# Patient Record
Sex: Male | Born: 1945 | Race: White | Hispanic: No | Marital: Single | Smoking: Never smoker
Health system: Southern US, Community
[De-identification: ages and names within clinical notes are randomized; demographics above are authoritative.]

## PROBLEM LIST (undated history)

## (undated) DIAGNOSIS — E7800 Pure hypercholesterolemia, unspecified: Secondary | ICD-10-CM

## (undated) DIAGNOSIS — I10 Essential (primary) hypertension: Secondary | ICD-10-CM

## (undated) DIAGNOSIS — K5792 Diverticulitis of intestine, part unspecified, without perforation or abscess without bleeding: Secondary | ICD-10-CM

---
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
## 1971-04-27 DIAGNOSIS — K759 Inflammatory liver disease, unspecified: Secondary | ICD-10-CM

## 1971-04-27 HISTORY — DX: Inflammatory liver disease, unspecified: K75.9

## 1998-10-25 ENCOUNTER — Ambulatory Visit (HOSPITAL_COMMUNITY): Admission: RE | Admit: 1998-10-25 | Discharge: 1998-10-25 | Payer: Self-pay | Admitting: Family Medicine

## 1998-10-25 ENCOUNTER — Encounter: Payer: Self-pay | Admitting: Family Medicine

## 1999-04-27 HISTORY — PX: COLON SURGERY: SHX602

## 1999-12-08 ENCOUNTER — Inpatient Hospital Stay (HOSPITAL_COMMUNITY): Admission: EM | Admit: 1999-12-08 | Discharge: 1999-12-15 | Payer: Self-pay | Admitting: Emergency Medicine

## 1999-12-25 ENCOUNTER — Encounter: Payer: Self-pay | Admitting: Internal Medicine

## 1999-12-25 ENCOUNTER — Ambulatory Visit (HOSPITAL_COMMUNITY): Admission: RE | Admit: 1999-12-25 | Discharge: 1999-12-25 | Payer: Self-pay | Admitting: Internal Medicine

## 2000-01-26 ENCOUNTER — Ambulatory Visit (HOSPITAL_COMMUNITY): Admission: RE | Admit: 2000-01-26 | Discharge: 2000-01-26 | Payer: Self-pay | Admitting: General Surgery

## 2000-01-26 ENCOUNTER — Encounter: Payer: Self-pay | Admitting: General Surgery

## 2000-02-12 ENCOUNTER — Inpatient Hospital Stay (HOSPITAL_COMMUNITY): Admission: RE | Admit: 2000-02-12 | Discharge: 2000-02-18 | Payer: Self-pay | Admitting: General Surgery

## 2000-02-12 ENCOUNTER — Encounter (INDEPENDENT_AMBULATORY_CARE_PROVIDER_SITE_OTHER): Payer: Self-pay | Admitting: Specialist

## 2000-12-16 ENCOUNTER — Encounter: Admission: RE | Admit: 2000-12-16 | Discharge: 2000-12-16 | Payer: Self-pay | Admitting: Infectious Diseases

## 2001-01-06 ENCOUNTER — Encounter: Admission: RE | Admit: 2001-01-06 | Discharge: 2001-01-06 | Payer: Self-pay | Admitting: Infectious Diseases

## 2002-04-16 ENCOUNTER — Ambulatory Visit (HOSPITAL_BASED_OUTPATIENT_CLINIC_OR_DEPARTMENT_OTHER): Admission: RE | Admit: 2002-04-16 | Discharge: 2002-04-16 | Payer: Self-pay | Admitting: Family Medicine

## 2005-04-08 ENCOUNTER — Ambulatory Visit (HOSPITAL_COMMUNITY): Admission: RE | Admit: 2005-04-08 | Discharge: 2005-04-08 | Payer: Self-pay | Admitting: Orthopedic Surgery

## 2005-04-08 ENCOUNTER — Ambulatory Visit (HOSPITAL_BASED_OUTPATIENT_CLINIC_OR_DEPARTMENT_OTHER): Admission: RE | Admit: 2005-04-08 | Discharge: 2005-04-08 | Payer: Self-pay | Admitting: Orthopedic Surgery

## 2006-04-26 HISTORY — PX: ROTATOR CUFF REPAIR: SHX139

## 2008-04-26 DIAGNOSIS — Z87442 Personal history of urinary calculi: Secondary | ICD-10-CM

## 2008-04-26 HISTORY — PX: COLONOSCOPY: SHX174

## 2008-04-26 HISTORY — DX: Personal history of urinary calculi: Z87.442

## 2009-01-03 ENCOUNTER — Encounter: Admission: RE | Admit: 2009-01-03 | Discharge: 2009-01-03 | Payer: Self-pay | Admitting: Family Medicine

## 2010-09-11 NOTE — H&P (Signed)
[facility]  Patient:    Stanley Romero, Stanley Romero                     MRN: [id number] Adm. Date:  [id number] Attending:  Felecia Shelling                         History and Physical  PRIMARY PHYSICIAN:  Dr. Elana Alm. Eliezer Lofts. at [facility].  CHIEF COMPLAINT:  Abdominal pain.  HISTORY OF PRESENT ILLNESS:  [name] is a 65 year old white male who presented to the emergency room on the day of admission with a three-day history of gradually worsening abdominal pain which localized to the left lower quadrant.  He has developed fever up to 102.  He has noted some soft, thin bowel movements.  He has not had any nausea or vomiting.  He denies any blood in the stool.  He was seen in the emergency room and found to have CT findings consistent with acute sigmoid diverticulitis.  He is admitted for inpatient management.  ALLERGIES:  None.  MEDICATIONS:  Pravachol 40 mg p.o. q.d.  MEDICAL HISTORY:  Medical history is remarkable for the hypercholesterolemia. He has been otherwise in good health.  SURGICAL HISTORY:  He had an appendectomy as a child.  FAMILY HISTORY:  The patients father has heart disease and diabetes.  Mother has breast cancer.  SOCIAL HISTORY:  He is married.  He does not smoke.  REVIEW OF SYSTEMS:  He denies any headache, chest pain, cough or shortness of breath.  He has not had any back pain or urinary symptoms.  No history of recorded weight loss.  He has had some decreased appetite recently.  PHYSICAL EXAMINATION  GENERAL:  Patient is a well-developed, well-nourished white male in moderate distress due to the abdominal pain.  VITAL SIGNS:  Temperature is 101.4.  Pulse is 110.  Respiratory rate 16 and nonlabored.  Blood pressure is 120/70.  HEENT:  The head is of normal configuration without lesions.  Eyes:  Pupils equal, round and reactive to light and accommodation.  There are normal extraocular movements.  Fundi  are benign.  Ears:  TMs are clear.  Throat is clear.  NECK:  Supple without adenopathy or thyromegaly.  LUNGS:  Clear.  HEART:  Regular rate and rhythm with normal S1 and S2, without murmurs, gallops or rubs.  ABDOMEN:  Active bowel sounds.  It is soft.  There is localized tenderness with some guarding in the left lower quadrant.  There is no rebound.  EXTREMITIES:  There is good range of motion of the hips without excessive pain.  RECTAL:  Exam is benign.  Stool is heme-negative.  EXTREMITIES:  Normal.  There is good range of motion.  BACK:  Straight.  NEUROLOGIC:  Cranial nerves II-XII are intact.  There is good motor, sensory and cerebellar findings.  Reflexes are 2+ and equal.  LABORATORY AND X-RAY FINDINGS:  His white blood cell count was 13,100, 87 segs and 7 lymphs.  His SGOT was 115, SGPT 105.  CT scan revealed focal proximal sigmoid diverticulitis with focal microperforation.  No evidence of abscess.  ASSESSMENT: 1. Sigmoid diverticulitis with microperforation. 2. Hypercholesterolemia.  PLAN:  Admit.  Bowel rest.  N.p.o.  IV normal saline at 100 cc/hr. Ciprofloxacin 400 mg IV q.12h.  Metronidazole 500 mg IV q.8h.  Morphine sulfate as needed for pain. DD:  12/08/99 TD:  12/08/99 Job: [id number]

## 2010-09-11 NOTE — Discharge Summary (Signed)
[facility]  Patient:    Stanley Romero, Stanley Romero                     MRN: [id number] Adm. Date:  [id number] Disc. Date: [id number] Attending:  Caleen Essex                           Discharge Summary  HISTORY:  Mr. Stanley Romero is a 65 year old white male who was admitted six days ago for an elective sigmoid colectomy for diverticulitis.  His operation had no complications.  The sigmoid colon was removed without difficulty and repaired primarily.  His postoperative course has been unremarkable.  He is currently tolerating a diet.  His pain is well-controlled with p.o. pain medicines.  He is ready for discharge to home.  CONDITION ON DISCHARGE:  Stable.  DISCHARGE MEDICATIONS:  He is to resume his home medications and he may have Vicodin 1-2 p.o. q.4-6h. p.r.n. for pain.  DISCHARGE INSTRUCTIONS:  His activity level is restricted to no heavy lifting. His diet has no restrictions.  For his wound care, he will go home with Steri-Strips over his wound and his staples will be discontinued today.  He may shower when he arrives at home.  FOLLOW-UP:  He will follow up with Dr. Carolynne Edouard in 1-2 weeks in the clinic and he is discharged home. DD:  02/18/00 TD:  02/18/00 Job: [id number]

## 2010-09-11 NOTE — Op Note (Signed)
[facility]  Patient:    Stanley Romero, Stanley Romero                     MRN: [id number] Proc. Date: 02/12/00 Adm. Date:  [id number] Attending:  Chevis Pretty S                           Operative Report  PREOPERATIVE DIAGNOSIS:  Diverticulitis  POSTOPERATIVE DIAGNOSIS:  Diverticulitis.  OPERATION:  Sigmoid colectomy.  SURGEON:  Chevis Pretty, M.D.  ASSISTANT:  Donnie Coffin. Samuella Cota, M.D.  ANESTHESIA:  General endotracheal anesthesia.  DESCRIPTION OF PROCEDURE:  After informed consent was obtained, the patient was brought to the operating room and placed in supine position on the operating table.  After adequate induction of general anesthesia, the patients abdomen was prepped with Betadine and draped in the usual sterile manner.  A lower midline incision was made with #10 blade knife. This incision was carried down through the rest of the skin and subcutaneous tissue using the Bovie electrocautery until the fascia of the anterior abdominal midline was encountered.  This was incised with the Bovie electrocautery.  The preperitoneal space was probed with a hemostat until the peritoneum was encountered.  The peritoneum was grasped between two hemostats and elevated. Care was taken to make sure there were no viscera between the two clamps, and the peritoneum was opened with the Bovie electrocautery.  This allowed entrance into the peritoneal cavity.  The rest of the incision was opened under direct vision for the length of the wound.  The abdomen was inspected. There were a few small adhesions of the omentum to the right abdominal wall and colon which were taken down with the Bovie electrocautery.  The small bowel was run and appeared normal.  The liver was inspected, and there were no mass lesions or abnormalities palpable.  The gallbladder was palpated and appeared to be normal with no stones.  The left colon was inspected, and there was a short segment of sigmoid colon  that had a couple visible diverticula. This portion of the sigmoid colon was slightly more injected than the rest of the colon, but the inflammatory process was pretty much resolved.  The sigmoid colon was mobilized by incising the peritoneal reflection at the white line of Toldt, and the sigmoid colon was freed to move with its mesentery.  The descending colon above was also mobilized in the same fashion. A point was chosen on the distal sigmoid colon below any apparent diverticula where the colon appeared to be normal without any inflammatory injection.  The mesentery at this point was incised with the Bovie electrocautery next to the bowel wall so that the distal sigmoid colon there could be circumferentially inspected and cleaned of any epiploicae or fatty tissue.  The distal sigmoid colon was then divided with a GIA 75 stapler.  A similar point was chosen proximally on the proximal sigmoid colon, and the mesentery was incised at its point adjacent to the colon wall so that the colon was cleared of any debris circumferentially.  The proximal colon was then divided with a GIA 75 stapler. The mesentery of the sigmoid colon was then serially clamped between Kelly clamps, cut, and ligated with 2-0 silk ties until the portion of sigmoid colon in question was free.  This portion of sigmoid colon was then sent to pathology for further evaluation.  The stapled edges of the proximal and distal colon were  cleaned of any fatty epiploicae tissue.  A 3-0 silk stitch was placed in the bowel wall at each lateral margin of the staple lines and tied.  These were tied down and then hemostated to hold the bowel in good approximation.  The staple lines were then removed using Bovie electrocautery, and the lumen of the intestine was examined and appeared to be normal. A row of seromuscular 3-0 silk sutures were placed in the two opposing posterior walls of the proximal and distal colon.  Next, a 4-0 PDS  suture was used for the colonic anastomosis starting in the middle of the posterior wall and running circumferentially until the two stitches met anteriorly.  These bites were full thickness, and the mucosa was easily approximated.  Once the two ends had met anteriorly, these two stitches were tied to each other, taking care not to pursestring the lumen of the bowel closed.  Next, an anterior layer of seromuscular 3-0 silk sutures were used to buttress the anterior segment of the anastomosis.  The bowel was inspected, and lumen was widely patent.  Hemostasis was excellent.  The mesentery of the sigmoid colon was then sewed down to the posterior peritoneum with a figure-of-eight 3-0 silk stitch to prevent any internal herniation.  The abdomen was then irrigated with copious amounts of warm saline.  The NG tube was palpated and was in the proper position.  The fascia of the abdomen was then closed with two #1 running PDS sutures.  The incision was then irrigated with copious amounts of saline, and the skin was closed with staples.  A sterile dressing was applied. The patient tolerated the procedure well.  At the end of the case, all sponge, needle, and instrument counts were correct.  The patient was awakened and taken to the recovery room in stable condition. DD:  02/12/00 TD:  02/14/00 Job: [id number]

## 2010-09-11 NOTE — Discharge Summary (Signed)
[facility]  Patient:    Stanley Romero, AGE                     MRN: [id number] Adm. Date:  [id number] Disc. Date: [id number] Attending:  Anastasio Romero CC:         Stanley Alm. Stanley Lofts., M.D.  Stanley Romero, M.D.   Discharge Summary  CONSULTATIONS:  Dr. Carolynne Romero.  PROCEDURES:  None.  DISCHARGE DIAGNOSES: 1. Acute diverticulitis with diverticular abscess. 2. Dyslipidemia. 3. History of appendectomy. 4. Elevated hepatocellular enzymes this admission, with near-complete    resolution, felt secondary to acute illness.  DISCHARGE MEDICATIONS: 1. Flagyl 500 mg p.o. t.i.d. x 3 weeks. 2. Cipro 500 mg p.o. b.i.d. x 3 weeks. 3. Pravachol 40 mg p.o. q.d. 4. Enteric-coated aspirin 81 mg p.o. q.d. 5. Phenergan 25-50 q.4-6h. p.r.n. nausea.  FOLLOW-UP:  The patient is scheduled with Dr. Chevis Romero Tuesday, December 29, 1999, at 9 a.m.  He is scheduled for repeat CT of the abdomen and pelvis at [facility] Friday, December 25, 1999, at 10 a.m.  HISTORY OF PRESENT ILLNESS:  The patient is a 65 year old man who presented to the emergency room on the day of admission with a three-day history of gradually worsening abdominal pain which localized to the left lower quadrant. He had also experienced a fever of 102, soft and thin bowel movements, but no blood in his stool, and no nausea or vomiting.  Emergency room evaluation revealed CT findings consistent with acute sigmoid diverticulitis, and Dr. Arsenio Romero, on call for [facility], was requested to admit. For further details of admission history and physical, please see his dictated report.  HOSPITAL COURSE:  Stanley Romero was admitted to [facility] and placed on IV antibiotics including Cipro and Flagyl.  An n.p.o. status ensued, and surgical consultation was provided by Dr. Chevis Romero.  Stanley Romero symptoms improved over the ensuing 48 hours, and Dr. Carolynne Romero felt the most appropriate course of action would be  to allow the abscess to continue to organize, allow the acute inflammation to subside, and follow repeat CT scan as an outpatient with further options either to include one-stage or two-stage procedure including colostomy.  His clinical improvement was remarkable; on the day of discharge he was tolerating meals without difficulty, walking about without pain, and feeling much improved.  Given normal temperature and resolution of leukocytosis, he was felt stable for discharge.  No additional problems were evident this admission, and Stanley Romero is agreeable with plan.  DISCHARGE LABORATORY DATA:  Hemoglobin 13.9, white count 7.8.  Creatinine 1.1, albumin 2.2, AST 32, ALT 32, alkaline phosphatase 88, total bilirubin 2.5, with negative right upper quadrant pain. DD:  01/26/00 TD:  01/26/00 Job: [id number]

## 2010-09-11 NOTE — Op Note (Signed)
NAMECHRISOPHER, PUSTEJOVSKY              ACCOUNT NO.:  [id number]   MEDICAL RECORD NO.:  [id number]          PATIENT TYPE:  AMB   LOCATION:  DSC                          FACILITY:  [facility]   PHYSICIAN:  Rodney A. Mortenson, M.D.DATE OF BIRTH:  30-Mar-1946   DATE OF PROCEDURE:  04/08/2005  DATE OF DISCHARGE:                                 OPERATIVE REPORT   PREOPERATIVE DIAGNOSES:  1.  Massive retracted rotator cuff tear, right shoulder.  2.  Superior labrum anterior and posterior 1 lesion, superior labrum.  3.  Type 3 acromion.   POSTOPERATIVE DIAGNOSES:  1.  Massive retracted rotator cuff tear, right shoulder.  2.  Superior labrum anterior and posterior 1 lesion, superior labrum.  3.  Type 3 acromion.   OPERATION:  1.  Arthroscopic debridement of superior labrum anterior and posterior 1      lesion, superior labrum.  2.  Acromioplasty.  3.  Repair of massive rotator cuff tear, supraspinatus tendon, with three      Mitek anchors.   SURGEON:  Lenard Galloway. Chaney Malling, M.D.   ANESTHESIA:  General.   PROCEDURE:  Patient placed on the operating table in the supine position and  after satisfactory general anesthesia, the patient was placed in a semi-  seated position.  The right shoulder and upper extremity were prepped with  DuraPrep and draped out in the usual manner.  The arthroscope was placed in  the posterior portal and a very careful examination of the glenohumeral  joint was done.  The articular cartilage of the humeral head and the glenoid  itself appeared normal.  The posterior aspect of the humeral head was  visualized and there was no Hill-Sachs lesion.  Anteriorly, the inferior  half of the labrum was completely intact and there was no Bankart lesion.  The superior part of the anterior labrum was frayed as it was a SLAP 1 type  lesion.  This was essentially stable.  The biceps anchor itself was stable.  A massive tear of the rotator cuff could be seen.  The entire  supraspinatus  was avulsed and retracted such that with the scope in the glenohumeral joint  one could clearly see the subacromial space.  Through an anterior operative  portal the intra-articular shaver was introduced.  The frayed and torn  posterior labrum anteriorly was then debrided aggressively.  Again, the  biceps anchor was stable.  The arthroscope was then removed.   A sabre-cut incision made over the anterior lateral aspect of the right  shoulder.  Skin edges were retracted.  Bleeders were coagulated.  The  deltoid fibers were released off the anterior aspect of the acromion in the  standard manner.  Excellent visualization of the subacromial space was  achieved.  The acromioplasty was done, and this gave excellent access to the  entire rotator cuff.  The rotator cuff was completely torn and retracted for  3-3.5 cm, and the biceps was totally exposed.  Fortunately, the posterior  leaf and anterior leaf could be pulled down and brought to the midline and  with almost anatomic closure without any difficulty.  The area of the  footprint was then debrided with a rongeur.  A copious amount of saline was  used to irrigate the wound throughout the procedure.  A series of three  Mitek staples were inserted, and both leaves were brought distally and to  the midline and an anatomic repair was achieved.  Technically, this went  extremely well.  The deltoid fibers were reattached to the anterior aspect  of the acromion in a standard manner with 0 Vicryl.  Vicryl 2-0 was used to  close the subcutaneous tissue and stainless steel staples were used to close  the skin.  Sterile dressings were applied and the patient returned to the  recovery room in excellent condition.  Technically this went extremely well.   DRAINS:  None.   COMPLICATIONS:  None.           ______________________________  Lenard Galloway. Chaney Malling, M.D.     RAM/MEDQ  D:  04/08/2005  T:  04/09/2005  Job:  [id number]

## 2011-06-20 ENCOUNTER — Emergency Department (HOSPITAL_COMMUNITY)
Admission: EM | Admit: 2011-06-20 | Discharge: 2011-06-21 | Disposition: A | Discharge status: Home / Self Care | Payer: 59 | Attending: Emergency Medicine | Admitting: Emergency Medicine

## 2011-06-20 ENCOUNTER — Emergency Department (HOSPITAL_COMMUNITY): Payer: 59

## 2011-06-20 ENCOUNTER — Encounter (HOSPITAL_COMMUNITY): Payer: Self-pay | Admitting: *Deleted

## 2011-06-20 DIAGNOSIS — S42009A Fracture of unspecified part of unspecified clavicle, initial encounter for closed fracture: Secondary | ICD-10-CM

## 2011-06-20 DIAGNOSIS — M542 Cervicalgia: Secondary | ICD-10-CM | POA: Insufficient documentation

## 2011-06-20 DIAGNOSIS — R079 Chest pain, unspecified: Secondary | ICD-10-CM | POA: Insufficient documentation

## 2011-06-20 DIAGNOSIS — R55 Syncope and collapse: Secondary | ICD-10-CM

## 2011-06-20 DIAGNOSIS — E7800 Pure hypercholesterolemia, unspecified: Secondary | ICD-10-CM | POA: Insufficient documentation

## 2011-06-20 HISTORY — DX: Pure hypercholesterolemia, unspecified: E78.00

## 2011-06-20 LAB — GLUCOSE, CAPILLARY: Glucose-Capillary: 111 mg/dL — ABNORMAL HIGH (ref 70–99)

## 2011-06-20 MED ORDER — SODIUM CHLORIDE 0.9 % IV BOLUS (SEPSIS)
1000.0000 mL | Freq: Once | INTRAVENOUS | Status: AC
  Administered 2011-06-20: 1000 mL via INTRAVENOUS

## 2011-06-20 MED ORDER — SODIUM CHLORIDE 0.9 % IV SOLN
Freq: Once | INTRAVENOUS | Status: AC
  Administered 2011-06-21: 500 mL via INTRAVENOUS

## 2011-06-20 MED ORDER — OXYCODONE-ACETAMINOPHEN 5-325 MG PO TABS: 2.0000 | ORAL_TABLET | ORAL | Status: AC | PRN

## 2011-06-20 MED ORDER — MORPHINE SULFATE 4 MG/ML IJ SOLN
6.0000 mg | Freq: Once | INTRAMUSCULAR | Status: AC
  Administered 2011-06-20: 6 mg via INTRAVENOUS
  Filled 2011-06-20: qty 2

## 2011-06-20 NOTE — Discharge Instructions (Signed)
Clavicle Fracture A clavicle fracture is a break in the collarbone. This is a common injury, especially in children. Collarbones do not harden until around the age of 20. Most collarbone fractures are treated with a simple arm sling. In some cases a figure-of-eight splint is used to help hold the broken bones in position. Although not often needed, surgery may be required if the bone fragments are not in the correct position (displaced).  HOME CARE INSTRUCTIONS   Apply ice to the injury for 15 to 20 minutes each hour while awake for 2 days. Put the ice in a plastic bag and place a towel between the bag of ice and your skin.   Wear the sling or splint constantly for as long as directed by your caregiver. You may remove the sling or splint for bathing or showering. Be sure to keep your shoulder in the same place as when the sling or splint is on. Do not lift your arm.   If a figure-of-eight splint is applied, it must be tightened by another person every day. Tighten it enough to keep the shoulders held back. Allow enough room to place the index finger between the body and strap. Loosen the splint immediately if you feel numbness or tingling in your hands.   Only take over-the-counter or prescription medicines for pain, discomfort, or fever as directed by your caregiver.   Avoid activities that irritate or increase the pain for 4 to 6 weeks after surgery.   Follow all instructions for follow-up with your caregiver. This includes any referrals, physical therapy, and rehabilitation. Any delay in obtaining necessary care could result in a delay or failure of the injury to heal properly.  SEEK MEDICAL CARE IF:  You have pain and swelling that are not relieved with medications. SEEK IMMEDIATE MEDICAL CARE IF:  Your arm is numb, cold, or pale, even when the splint is loose. MAKE SURE YOU:   Understand these instructions.   Will watch your condition.   Will get help right away if you are not doing  well or get worse.  Document Released: 01/20/2005 Document Revised: 12/23/2010 Document Reviewed: 11/16/2007 [employer] Patient Information 2012 [employer]. 

## 2011-06-20 NOTE — ED Notes (Signed)
EKG given to EDP, Campos.
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
## 2011-06-20 NOTE — ED Notes (Signed)
Upon discharge pt had syncopal episode while at discharge window, pt states he has not eaten all day, pt noted to be pale and diaphoretic, pt assisted to wheelchair, taken to triage 8, VS 70/51 HR 54, MD to bedside, IV initiated, pt to move to acute bed for monitoring, pt c/o dizziness and nausea at this time
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
## 2011-06-20 NOTE — ED Notes (Signed)
MD Campos to bedisde

## 2011-06-21 NOTE — ED Provider Notes (Signed)
Medical screening examination/treatment/procedure(s) were conducted as a shared visit with non-physician practitioner(s) and myself.  I personally evaluated the patient during the encounter  12:25 AM The patient was attempting to leave he became lightheaded and felt warm and near syncopal episode.  His fingerstick was normal.  The patient was noted be bradycardic in the mid 40s with a blood pressure of approximately 70 systolic.  He was pale diaphoretic and looked poor.  This appeared consistent with a vagal episode.  He was taken back to the main portion of the ER he was laid flat and IV fluids were initiated.  His heart rate quickly picked back up to the 70s his blood pressure was 120 systolic.  The patient was observed in the ER for some time.  He drank Coca-Cola and ate some crackers and stated he felt much better.  Morphine was given for his right clavicular fracture and for his associated pain.  He is able to walk around the ER he states he feels much better he'll be discharged home.  Chest x-ray was reviewed that was obtained earlier and was normal.  His abdominal exam is benign.   Date: 06/21/2011  Rate: 80  Rhythm: normal sinus rhythm  QRS Axis: normal  Intervals: normal  ST/T Wave abnormalities: normal  Conduction Disutrbances: none  Narrative Interpretation:   Old EKG Reviewed: No prior EKG available     Lyanne Co, MD 06/21/11 [phone]

## 2013-02-19 ENCOUNTER — Encounter (INDEPENDENT_AMBULATORY_CARE_PROVIDER_SITE_OTHER): Payer: 59 | Admitting: Ophthalmology

## 2013-02-19 DIAGNOSIS — H2510 Age-related nuclear cataract, unspecified eye: Secondary | ICD-10-CM

## 2013-02-19 DIAGNOSIS — H35379 Puckering of macula, unspecified eye: Secondary | ICD-10-CM

## 2013-02-19 DIAGNOSIS — H43819 Vitreous degeneration, unspecified eye: Secondary | ICD-10-CM

## 2014-02-19 ENCOUNTER — Ambulatory Visit (INDEPENDENT_AMBULATORY_CARE_PROVIDER_SITE_OTHER): Payer: 59 | Admitting: Ophthalmology

## 2016-01-09 ENCOUNTER — Encounter (INDEPENDENT_AMBULATORY_CARE_PROVIDER_SITE_OTHER): Payer: Self-pay | Admitting: Ophthalmology

## 2016-01-12 ENCOUNTER — Encounter (INDEPENDENT_AMBULATORY_CARE_PROVIDER_SITE_OTHER): Payer: 59 | Admitting: Ophthalmology

## 2016-08-17 DIAGNOSIS — E7800 Pure hypercholesterolemia, unspecified: Secondary | ICD-10-CM | POA: Diagnosis present

## 2016-08-17 DIAGNOSIS — I10 Essential (primary) hypertension: Secondary | ICD-10-CM | POA: Diagnosis present

## 2016-12-03 DIAGNOSIS — I10 Essential (primary) hypertension: Secondary | ICD-10-CM | POA: Diagnosis not present

## 2016-12-03 DIAGNOSIS — H25013 Cortical age-related cataract, bilateral: Secondary | ICD-10-CM | POA: Diagnosis not present

## 2016-12-03 DIAGNOSIS — H11153 Pinguecula, bilateral: Secondary | ICD-10-CM | POA: Diagnosis not present

## 2016-12-03 DIAGNOSIS — H18413 Arcus senilis, bilateral: Secondary | ICD-10-CM | POA: Diagnosis not present

## 2016-12-03 DIAGNOSIS — H35033 Hypertensive retinopathy, bilateral: Secondary | ICD-10-CM | POA: Diagnosis not present

## 2016-12-03 DIAGNOSIS — H35371 Puckering of macula, right eye: Secondary | ICD-10-CM | POA: Diagnosis not present

## 2016-12-03 DIAGNOSIS — H2513 Age-related nuclear cataract, bilateral: Secondary | ICD-10-CM | POA: Diagnosis not present

## 2016-12-03 DIAGNOSIS — H401112 Primary open-angle glaucoma, right eye, moderate stage: Secondary | ICD-10-CM | POA: Diagnosis not present

## 2016-12-03 DIAGNOSIS — H47231 Glaucomatous optic atrophy, right eye: Secondary | ICD-10-CM | POA: Diagnosis not present

## 2016-12-06 DIAGNOSIS — G4733 Obstructive sleep apnea (adult) (pediatric): Secondary | ICD-10-CM | POA: Diagnosis not present

## 2016-12-06 DIAGNOSIS — N281 Cyst of kidney, acquired: Secondary | ICD-10-CM | POA: Diagnosis not present

## 2016-12-06 DIAGNOSIS — R748 Abnormal levels of other serum enzymes: Secondary | ICD-10-CM | POA: Diagnosis not present

## 2016-12-07 DIAGNOSIS — G4733 Obstructive sleep apnea (adult) (pediatric): Secondary | ICD-10-CM | POA: Diagnosis not present

## 2017-01-07 DIAGNOSIS — Z23 Encounter for immunization: Secondary | ICD-10-CM | POA: Diagnosis not present

## 2017-03-11 DIAGNOSIS — H31011 Macula scars of posterior pole (postinflammatory) (post-traumatic), right eye: Secondary | ICD-10-CM | POA: Diagnosis not present

## 2017-03-11 DIAGNOSIS — H47231 Glaucomatous optic atrophy, right eye: Secondary | ICD-10-CM | POA: Diagnosis not present

## 2017-03-11 DIAGNOSIS — H35371 Puckering of macula, right eye: Secondary | ICD-10-CM | POA: Diagnosis not present

## 2017-03-11 DIAGNOSIS — H401112 Primary open-angle glaucoma, right eye, moderate stage: Secondary | ICD-10-CM | POA: Diagnosis not present

## 2017-03-11 DIAGNOSIS — H25013 Cortical age-related cataract, bilateral: Secondary | ICD-10-CM | POA: Diagnosis not present

## 2017-03-11 DIAGNOSIS — H18413 Arcus senilis, bilateral: Secondary | ICD-10-CM | POA: Diagnosis not present

## 2017-03-11 DIAGNOSIS — H11423 Conjunctival edema, bilateral: Secondary | ICD-10-CM | POA: Diagnosis not present

## 2017-04-11 DIAGNOSIS — I10 Essential (primary) hypertension: Secondary | ICD-10-CM | POA: Diagnosis not present

## 2017-04-11 DIAGNOSIS — Z1159 Encounter for screening for other viral diseases: Secondary | ICD-10-CM | POA: Diagnosis not present

## 2017-04-11 DIAGNOSIS — Z6828 Body mass index (BMI) 28.0-28.9, adult: Secondary | ICD-10-CM | POA: Diagnosis not present

## 2017-04-11 DIAGNOSIS — Z1389 Encounter for screening for other disorder: Secondary | ICD-10-CM | POA: Diagnosis not present

## 2017-04-11 DIAGNOSIS — E7800 Pure hypercholesterolemia, unspecified: Secondary | ICD-10-CM | POA: Diagnosis not present

## 2017-04-11 DIAGNOSIS — G4700 Insomnia, unspecified: Secondary | ICD-10-CM | POA: Diagnosis not present

## 2017-05-11 DIAGNOSIS — H401121 Primary open-angle glaucoma, left eye, mild stage: Secondary | ICD-10-CM | POA: Diagnosis not present

## 2017-05-11 DIAGNOSIS — H401112 Primary open-angle glaucoma, right eye, moderate stage: Secondary | ICD-10-CM | POA: Diagnosis not present

## 2017-05-11 DIAGNOSIS — H2513 Age-related nuclear cataract, bilateral: Secondary | ICD-10-CM | POA: Diagnosis not present

## 2017-05-17 DIAGNOSIS — I10 Essential (primary) hypertension: Secondary | ICD-10-CM | POA: Diagnosis not present

## 2017-05-17 DIAGNOSIS — E7800 Pure hypercholesterolemia, unspecified: Secondary | ICD-10-CM | POA: Diagnosis not present

## 2017-05-17 DIAGNOSIS — Z1159 Encounter for screening for other viral diseases: Secondary | ICD-10-CM | POA: Diagnosis not present

## 2017-05-19 DIAGNOSIS — H401112 Primary open-angle glaucoma, right eye, moderate stage: Secondary | ICD-10-CM | POA: Diagnosis not present

## 2017-05-19 DIAGNOSIS — H2511 Age-related nuclear cataract, right eye: Secondary | ICD-10-CM | POA: Diagnosis not present

## 2017-06-07 DIAGNOSIS — H2512 Age-related nuclear cataract, left eye: Secondary | ICD-10-CM | POA: Diagnosis not present

## 2017-06-09 DIAGNOSIS — H2512 Age-related nuclear cataract, left eye: Secondary | ICD-10-CM | POA: Diagnosis not present

## 2017-10-18 DIAGNOSIS — Z6828 Body mass index (BMI) 28.0-28.9, adult: Secondary | ICD-10-CM | POA: Diagnosis not present

## 2017-10-18 DIAGNOSIS — Z1389 Encounter for screening for other disorder: Secondary | ICD-10-CM | POA: Diagnosis not present

## 2017-10-18 DIAGNOSIS — E7800 Pure hypercholesterolemia, unspecified: Secondary | ICD-10-CM | POA: Diagnosis not present

## 2017-10-18 DIAGNOSIS — G4700 Insomnia, unspecified: Secondary | ICD-10-CM | POA: Diagnosis not present

## 2017-10-18 DIAGNOSIS — Z23 Encounter for immunization: Secondary | ICD-10-CM | POA: Diagnosis not present

## 2017-10-18 DIAGNOSIS — R7303 Prediabetes: Secondary | ICD-10-CM | POA: Diagnosis not present

## 2017-10-18 DIAGNOSIS — N400 Enlarged prostate without lower urinary tract symptoms: Secondary | ICD-10-CM | POA: Diagnosis not present

## 2017-10-18 DIAGNOSIS — I10 Essential (primary) hypertension: Secondary | ICD-10-CM | POA: Diagnosis not present

## 2017-10-31 DIAGNOSIS — H35033 Hypertensive retinopathy, bilateral: Secondary | ICD-10-CM | POA: Diagnosis not present

## 2017-10-31 DIAGNOSIS — H401121 Primary open-angle glaucoma, left eye, mild stage: Secondary | ICD-10-CM | POA: Diagnosis not present

## 2017-10-31 DIAGNOSIS — H18413 Arcus senilis, bilateral: Secondary | ICD-10-CM | POA: Diagnosis not present

## 2017-10-31 DIAGNOSIS — H11823 Conjunctivochalasis, bilateral: Secondary | ICD-10-CM | POA: Diagnosis not present

## 2017-10-31 DIAGNOSIS — H0100B Unspecified blepharitis left eye, upper and lower eyelids: Secondary | ICD-10-CM | POA: Diagnosis not present

## 2017-10-31 DIAGNOSIS — H0100A Unspecified blepharitis right eye, upper and lower eyelids: Secondary | ICD-10-CM | POA: Diagnosis not present

## 2017-10-31 DIAGNOSIS — H401112 Primary open-angle glaucoma, right eye, moderate stage: Secondary | ICD-10-CM | POA: Diagnosis not present

## 2017-10-31 DIAGNOSIS — Z9841 Cataract extraction status, right eye: Secondary | ICD-10-CM | POA: Diagnosis not present

## 2017-10-31 DIAGNOSIS — H35371 Puckering of macula, right eye: Secondary | ICD-10-CM | POA: Diagnosis not present

## 2017-10-31 DIAGNOSIS — H04123 Dry eye syndrome of bilateral lacrimal glands: Secondary | ICD-10-CM | POA: Diagnosis not present

## 2017-10-31 DIAGNOSIS — Z961 Presence of intraocular lens: Secondary | ICD-10-CM | POA: Diagnosis not present

## 2017-10-31 DIAGNOSIS — H11153 Pinguecula, bilateral: Secondary | ICD-10-CM | POA: Diagnosis not present

## 2017-11-08 ENCOUNTER — Encounter (INDEPENDENT_AMBULATORY_CARE_PROVIDER_SITE_OTHER): Payer: Medicare Other | Admitting: Ophthalmology

## 2017-11-08 DIAGNOSIS — H35371 Puckering of macula, right eye: Secondary | ICD-10-CM

## 2017-11-08 DIAGNOSIS — H43813 Vitreous degeneration, bilateral: Secondary | ICD-10-CM | POA: Diagnosis not present

## 2017-11-08 DIAGNOSIS — H35033 Hypertensive retinopathy, bilateral: Secondary | ICD-10-CM | POA: Diagnosis not present

## 2017-11-08 DIAGNOSIS — I10 Essential (primary) hypertension: Secondary | ICD-10-CM | POA: Diagnosis not present

## 2018-01-04 DIAGNOSIS — H73823 Atrophic nonflaccid tympanic membrane, bilateral: Secondary | ICD-10-CM | POA: Diagnosis not present

## 2018-01-04 DIAGNOSIS — H6983 Other specified disorders of Eustachian tube, bilateral: Secondary | ICD-10-CM | POA: Diagnosis not present

## 2018-01-04 DIAGNOSIS — H903 Sensorineural hearing loss, bilateral: Secondary | ICD-10-CM | POA: Diagnosis not present

## 2018-01-04 DIAGNOSIS — H8111 Benign paroxysmal vertigo, right ear: Secondary | ICD-10-CM | POA: Diagnosis not present

## 2018-01-24 DIAGNOSIS — H35371 Puckering of macula, right eye: Secondary | ICD-10-CM | POA: Diagnosis not present

## 2018-01-24 DIAGNOSIS — H47233 Glaucomatous optic atrophy, bilateral: Secondary | ICD-10-CM | POA: Diagnosis not present

## 2018-01-24 DIAGNOSIS — H18413 Arcus senilis, bilateral: Secondary | ICD-10-CM | POA: Diagnosis not present

## 2018-01-24 DIAGNOSIS — I10 Essential (primary) hypertension: Secondary | ICD-10-CM | POA: Diagnosis not present

## 2018-01-24 DIAGNOSIS — Z9841 Cataract extraction status, right eye: Secondary | ICD-10-CM | POA: Diagnosis not present

## 2018-01-24 DIAGNOSIS — H11153 Pinguecula, bilateral: Secondary | ICD-10-CM | POA: Diagnosis not present

## 2018-01-24 DIAGNOSIS — H401112 Primary open-angle glaucoma, right eye, moderate stage: Secondary | ICD-10-CM | POA: Diagnosis not present

## 2018-01-24 DIAGNOSIS — H04123 Dry eye syndrome of bilateral lacrimal glands: Secondary | ICD-10-CM | POA: Diagnosis not present

## 2018-01-24 DIAGNOSIS — H26492 Other secondary cataract, left eye: Secondary | ICD-10-CM | POA: Diagnosis not present

## 2018-01-24 DIAGNOSIS — Z961 Presence of intraocular lens: Secondary | ICD-10-CM | POA: Diagnosis not present

## 2018-01-24 DIAGNOSIS — H16223 Keratoconjunctivitis sicca, not specified as Sjogren's, bilateral: Secondary | ICD-10-CM | POA: Diagnosis not present

## 2018-01-24 DIAGNOSIS — H35033 Hypertensive retinopathy, bilateral: Secondary | ICD-10-CM | POA: Diagnosis not present

## 2018-02-02 DIAGNOSIS — Z23 Encounter for immunization: Secondary | ICD-10-CM | POA: Diagnosis not present

## 2018-02-20 DIAGNOSIS — H903 Sensorineural hearing loss, bilateral: Secondary | ICD-10-CM | POA: Diagnosis not present

## 2018-02-20 DIAGNOSIS — H8111 Benign paroxysmal vertigo, right ear: Secondary | ICD-10-CM | POA: Diagnosis not present

## 2018-02-20 DIAGNOSIS — H6983 Other specified disorders of Eustachian tube, bilateral: Secondary | ICD-10-CM | POA: Diagnosis not present

## 2018-02-20 DIAGNOSIS — H73823 Atrophic nonflaccid tympanic membrane, bilateral: Secondary | ICD-10-CM | POA: Diagnosis not present

## 2018-03-16 DIAGNOSIS — H6983 Other specified disorders of Eustachian tube, bilateral: Secondary | ICD-10-CM | POA: Diagnosis not present

## 2018-03-16 DIAGNOSIS — H903 Sensorineural hearing loss, bilateral: Secondary | ICD-10-CM | POA: Diagnosis not present

## 2018-03-16 DIAGNOSIS — H73823 Atrophic nonflaccid tympanic membrane, bilateral: Secondary | ICD-10-CM | POA: Diagnosis not present

## 2018-03-16 DIAGNOSIS — H8111 Benign paroxysmal vertigo, right ear: Secondary | ICD-10-CM | POA: Diagnosis not present

## 2018-03-17 DIAGNOSIS — H903 Sensorineural hearing loss, bilateral: Secondary | ICD-10-CM | POA: Diagnosis not present

## 2018-05-03 DIAGNOSIS — I10 Essential (primary) hypertension: Secondary | ICD-10-CM | POA: Diagnosis not present

## 2018-05-03 DIAGNOSIS — H35033 Hypertensive retinopathy, bilateral: Secondary | ICD-10-CM | POA: Diagnosis not present

## 2018-05-03 DIAGNOSIS — H35371 Puckering of macula, right eye: Secondary | ICD-10-CM | POA: Diagnosis not present

## 2018-05-03 DIAGNOSIS — Z961 Presence of intraocular lens: Secondary | ICD-10-CM | POA: Diagnosis not present

## 2018-05-03 DIAGNOSIS — H47233 Glaucomatous optic atrophy, bilateral: Secondary | ICD-10-CM | POA: Diagnosis not present

## 2018-05-03 DIAGNOSIS — Z9849 Cataract extraction status, unspecified eye: Secondary | ICD-10-CM | POA: Diagnosis not present

## 2018-05-03 DIAGNOSIS — H31011 Macula scars of posterior pole (postinflammatory) (post-traumatic), right eye: Secondary | ICD-10-CM | POA: Diagnosis not present

## 2018-05-03 DIAGNOSIS — H401131 Primary open-angle glaucoma, bilateral, mild stage: Secondary | ICD-10-CM | POA: Diagnosis not present

## 2018-05-16 DIAGNOSIS — R7303 Prediabetes: Secondary | ICD-10-CM | POA: Diagnosis not present

## 2018-05-16 DIAGNOSIS — I10 Essential (primary) hypertension: Secondary | ICD-10-CM | POA: Diagnosis not present

## 2018-05-16 DIAGNOSIS — Z0000 Encounter for general adult medical examination without abnormal findings: Secondary | ICD-10-CM | POA: Diagnosis not present

## 2018-05-16 DIAGNOSIS — Z6829 Body mass index (BMI) 29.0-29.9, adult: Secondary | ICD-10-CM | POA: Diagnosis not present

## 2018-05-16 DIAGNOSIS — E7800 Pure hypercholesterolemia, unspecified: Secondary | ICD-10-CM | POA: Diagnosis not present

## 2018-05-16 DIAGNOSIS — N400 Enlarged prostate without lower urinary tract symptoms: Secondary | ICD-10-CM | POA: Diagnosis not present

## 2018-05-16 DIAGNOSIS — Z1211 Encounter for screening for malignant neoplasm of colon: Secondary | ICD-10-CM | POA: Diagnosis not present

## 2018-05-16 DIAGNOSIS — G4700 Insomnia, unspecified: Secondary | ICD-10-CM | POA: Diagnosis not present

## 2018-05-16 DIAGNOSIS — Z1389 Encounter for screening for other disorder: Secondary | ICD-10-CM | POA: Diagnosis not present

## 2018-11-07 ENCOUNTER — Other Ambulatory Visit: Payer: Self-pay | Admitting: Family Medicine

## 2018-11-07 DIAGNOSIS — K8689 Other specified diseases of pancreas: Secondary | ICD-10-CM

## 2018-11-14 DIAGNOSIS — D1801 Hemangioma of skin and subcutaneous tissue: Secondary | ICD-10-CM | POA: Diagnosis not present

## 2018-11-14 DIAGNOSIS — L821 Other seborrheic keratosis: Secondary | ICD-10-CM | POA: Diagnosis not present

## 2018-11-14 DIAGNOSIS — L814 Other melanin hyperpigmentation: Secondary | ICD-10-CM | POA: Diagnosis not present

## 2018-11-14 DIAGNOSIS — D2372 Other benign neoplasm of skin of left lower limb, including hip: Secondary | ICD-10-CM | POA: Diagnosis not present

## 2018-11-14 DIAGNOSIS — D225 Melanocytic nevi of trunk: Secondary | ICD-10-CM | POA: Diagnosis not present

## 2018-11-14 DIAGNOSIS — D2271 Melanocytic nevi of right lower limb, including hip: Secondary | ICD-10-CM | POA: Diagnosis not present

## 2018-12-05 DIAGNOSIS — Z03818 Encounter for observation for suspected exposure to other biological agents ruled out: Secondary | ICD-10-CM | POA: Diagnosis not present

## 2018-12-09 ENCOUNTER — Ambulatory Visit
Admission: RE | Admit: 2018-12-09 | Discharge: 2018-12-09 | Disposition: A | Discharge status: Home / Self Care | Payer: Medicare Other | Source: Ambulatory Visit | Attending: Family Medicine | Admitting: Family Medicine

## 2018-12-09 ENCOUNTER — Other Ambulatory Visit: Payer: Self-pay

## 2018-12-09 DIAGNOSIS — N281 Cyst of kidney, acquired: Secondary | ICD-10-CM | POA: Diagnosis not present

## 2018-12-09 DIAGNOSIS — K862 Cyst of pancreas: Secondary | ICD-10-CM | POA: Diagnosis not present

## 2018-12-09 DIAGNOSIS — K8689 Other specified diseases of pancreas: Secondary | ICD-10-CM

## 2018-12-09 MED ORDER — GADOBENATE DIMEGLUMINE 529 MG/ML IV SOLN
17.0000 mL | Freq: Once | INTRAVENOUS | Status: AC | PRN
  Administered 2018-12-09: 15:00:00 17 mL via INTRAVENOUS

## 2019-01-08 DIAGNOSIS — Z23 Encounter for immunization: Secondary | ICD-10-CM | POA: Diagnosis not present

## 2019-03-09 DIAGNOSIS — H35033 Hypertensive retinopathy, bilateral: Secondary | ICD-10-CM | POA: Diagnosis not present

## 2019-03-09 DIAGNOSIS — H26491 Other secondary cataract, right eye: Secondary | ICD-10-CM | POA: Diagnosis not present

## 2019-03-09 DIAGNOSIS — H16223 Keratoconjunctivitis sicca, not specified as Sjogren's, bilateral: Secondary | ICD-10-CM | POA: Diagnosis not present

## 2019-03-09 DIAGNOSIS — Z961 Presence of intraocular lens: Secondary | ICD-10-CM | POA: Diagnosis not present

## 2019-03-09 DIAGNOSIS — H11153 Pinguecula, bilateral: Secondary | ICD-10-CM | POA: Diagnosis not present

## 2019-03-09 DIAGNOSIS — H18413 Arcus senilis, bilateral: Secondary | ICD-10-CM | POA: Diagnosis not present

## 2019-03-09 DIAGNOSIS — H401132 Primary open-angle glaucoma, bilateral, moderate stage: Secondary | ICD-10-CM | POA: Diagnosis not present

## 2019-03-09 DIAGNOSIS — H35371 Puckering of macula, right eye: Secondary | ICD-10-CM | POA: Diagnosis not present

## 2019-06-08 ENCOUNTER — Ambulatory Visit: Payer: Medicare Other | Attending: Internal Medicine

## 2019-06-08 DIAGNOSIS — Z23 Encounter for immunization: Secondary | ICD-10-CM

## 2019-06-08 NOTE — Progress Notes (Signed)
   Covid-19 Vaccination Clinic  Name:  Stanley Romero    MRN: [id number] DOB: 1945/11/05  06/08/2019  [name] was observed post Covid-19 immunization for 15 minutes without incidence. He was provided with Vaccine Information Sheet and instruction to access the V-Safe system.   [name] was instructed to call 911 with any severe reactions post vaccine: Marland Kitchen Difficulty breathing  . Swelling of your face and throat  . A fast heartbeat  . A bad rash all over your body  . Dizziness and weakness    Immunizations Administered    Name Date Dose VIS Date Route   Pfizer COVID-19 Vaccine 06/08/2019  9:16 AM 0.3 mL 04/06/2019 Intramuscular   Manufacturer: [place]   Lot: [id number]   [place]: [id number]

## 2019-07-01 ENCOUNTER — Ambulatory Visit: Payer: Medicare Other | Attending: Internal Medicine

## 2019-07-01 DIAGNOSIS — Z23 Encounter for immunization: Secondary | ICD-10-CM | POA: Insufficient documentation

## 2019-07-01 NOTE — Progress Notes (Signed)
   Covid-19 Vaccination Clinic  Name:  Stanley Romero    MRN: [id number] DOB: 07/22/1945  07/01/2019  Stanley Romero was observed post Covid-19 immunization for 15 minutes without incident. He was provided with Vaccine Information Sheet and instruction to access the V-Safe system.   Stanley Romero was instructed to call 911 with any severe reactions post vaccine: Marland Kitchen Difficulty breathing  . Swelling of face and throat  . A fast heartbeat  . A bad rash all over body  . Dizziness and weakness   Immunizations Administered    Name Date Dose VIS Date Route   Pfizer COVID-19 Vaccine 07/01/2019 10:40 AM 0.3 mL 04/06/2019 Intramuscular   Manufacturer: [place]   Lot: [id number]   [place]: [id number]

## 2019-07-12 DIAGNOSIS — N400 Enlarged prostate without lower urinary tract symptoms: Secondary | ICD-10-CM | POA: Diagnosis not present

## 2019-07-12 DIAGNOSIS — Z1211 Encounter for screening for malignant neoplasm of colon: Secondary | ICD-10-CM | POA: Diagnosis not present

## 2019-07-12 DIAGNOSIS — E7800 Pure hypercholesterolemia, unspecified: Secondary | ICD-10-CM | POA: Diagnosis not present

## 2019-07-12 DIAGNOSIS — I10 Essential (primary) hypertension: Secondary | ICD-10-CM | POA: Diagnosis not present

## 2019-07-12 DIAGNOSIS — R7309 Other abnormal glucose: Secondary | ICD-10-CM | POA: Diagnosis not present

## 2019-07-12 DIAGNOSIS — Z6829 Body mass index (BMI) 29.0-29.9, adult: Secondary | ICD-10-CM | POA: Diagnosis not present

## 2019-07-12 DIAGNOSIS — Z0000 Encounter for general adult medical examination without abnormal findings: Secondary | ICD-10-CM | POA: Diagnosis not present

## 2019-07-12 DIAGNOSIS — H9190 Unspecified hearing loss, unspecified ear: Secondary | ICD-10-CM | POA: Diagnosis not present

## 2019-07-12 DIAGNOSIS — Z1389 Encounter for screening for other disorder: Secondary | ICD-10-CM | POA: Diagnosis not present

## 2019-07-12 DIAGNOSIS — G4700 Insomnia, unspecified: Secondary | ICD-10-CM | POA: Diagnosis not present

## 2019-07-13 DIAGNOSIS — H26491 Other secondary cataract, right eye: Secondary | ICD-10-CM | POA: Diagnosis not present

## 2019-07-13 DIAGNOSIS — H53453 Other localized visual field defect, bilateral: Secondary | ICD-10-CM | POA: Diagnosis not present

## 2019-07-13 DIAGNOSIS — H35033 Hypertensive retinopathy, bilateral: Secondary | ICD-10-CM | POA: Diagnosis not present

## 2019-07-13 DIAGNOSIS — H401132 Primary open-angle glaucoma, bilateral, moderate stage: Secondary | ICD-10-CM | POA: Diagnosis not present

## 2019-07-13 DIAGNOSIS — H18413 Arcus senilis, bilateral: Secondary | ICD-10-CM | POA: Diagnosis not present

## 2019-07-13 DIAGNOSIS — H35373 Puckering of macula, bilateral: Secondary | ICD-10-CM | POA: Diagnosis not present

## 2019-07-13 DIAGNOSIS — H11153 Pinguecula, bilateral: Secondary | ICD-10-CM | POA: Diagnosis not present

## 2019-07-13 DIAGNOSIS — H3589 Other specified retinal disorders: Secondary | ICD-10-CM | POA: Diagnosis not present

## 2019-07-13 DIAGNOSIS — H16223 Keratoconjunctivitis sicca, not specified as Sjogren's, bilateral: Secondary | ICD-10-CM | POA: Diagnosis not present

## 2019-08-08 DIAGNOSIS — H906 Mixed conductive and sensorineural hearing loss, bilateral: Secondary | ICD-10-CM | POA: Diagnosis not present

## 2019-09-28 DIAGNOSIS — J40 Bronchitis, not specified as acute or chronic: Secondary | ICD-10-CM | POA: Diagnosis not present

## 2019-09-28 DIAGNOSIS — J329 Chronic sinusitis, unspecified: Secondary | ICD-10-CM | POA: Diagnosis not present

## 2019-10-30 DIAGNOSIS — H35033 Hypertensive retinopathy, bilateral: Secondary | ICD-10-CM | POA: Diagnosis not present

## 2019-10-30 DIAGNOSIS — H401132 Primary open-angle glaucoma, bilateral, moderate stage: Secondary | ICD-10-CM | POA: Diagnosis not present

## 2019-10-30 DIAGNOSIS — Z961 Presence of intraocular lens: Secondary | ICD-10-CM | POA: Diagnosis not present

## 2019-10-30 DIAGNOSIS — H16223 Keratoconjunctivitis sicca, not specified as Sjogren's, bilateral: Secondary | ICD-10-CM | POA: Diagnosis not present

## 2019-10-30 DIAGNOSIS — H53453 Other localized visual field defect, bilateral: Secondary | ICD-10-CM | POA: Diagnosis not present

## 2019-10-30 DIAGNOSIS — H3589 Other specified retinal disorders: Secondary | ICD-10-CM | POA: Diagnosis not present

## 2019-10-30 DIAGNOSIS — H26491 Other secondary cataract, right eye: Secondary | ICD-10-CM | POA: Diagnosis not present

## 2019-10-30 DIAGNOSIS — H35373 Puckering of macula, bilateral: Secondary | ICD-10-CM | POA: Diagnosis not present

## 2019-12-01 DIAGNOSIS — Z1211 Encounter for screening for malignant neoplasm of colon: Secondary | ICD-10-CM | POA: Diagnosis not present

## 2020-01-08 DIAGNOSIS — Z23 Encounter for immunization: Secondary | ICD-10-CM | POA: Diagnosis not present

## 2020-01-28 DIAGNOSIS — H16223 Keratoconjunctivitis sicca, not specified as Sjogren's, bilateral: Secondary | ICD-10-CM | POA: Diagnosis not present

## 2020-01-28 DIAGNOSIS — H35373 Puckering of macula, bilateral: Secondary | ICD-10-CM | POA: Diagnosis not present

## 2020-01-28 DIAGNOSIS — H401132 Primary open-angle glaucoma, bilateral, moderate stage: Secondary | ICD-10-CM | POA: Diagnosis not present

## 2020-01-28 DIAGNOSIS — H35033 Hypertensive retinopathy, bilateral: Secondary | ICD-10-CM | POA: Diagnosis not present

## 2020-01-29 DIAGNOSIS — Z20828 Contact with and (suspected) exposure to other viral communicable diseases: Secondary | ICD-10-CM | POA: Diagnosis not present

## 2020-01-30 DIAGNOSIS — L821 Other seborrheic keratosis: Secondary | ICD-10-CM | POA: Diagnosis not present

## 2020-01-30 DIAGNOSIS — L814 Other melanin hyperpigmentation: Secondary | ICD-10-CM | POA: Diagnosis not present

## 2020-01-30 DIAGNOSIS — D1801 Hemangioma of skin and subcutaneous tissue: Secondary | ICD-10-CM | POA: Diagnosis not present

## 2020-02-21 DIAGNOSIS — Z03818 Encounter for observation for suspected exposure to other biological agents ruled out: Secondary | ICD-10-CM | POA: Diagnosis not present

## 2020-02-21 DIAGNOSIS — R509 Fever, unspecified: Secondary | ICD-10-CM | POA: Diagnosis not present

## 2020-02-21 DIAGNOSIS — R112 Nausea with vomiting, unspecified: Secondary | ICD-10-CM | POA: Diagnosis not present

## 2020-02-25 DIAGNOSIS — R1011 Right upper quadrant pain: Secondary | ICD-10-CM | POA: Diagnosis not present

## 2020-02-25 DIAGNOSIS — K862 Cyst of pancreas: Secondary | ICD-10-CM | POA: Diagnosis not present

## 2020-02-25 DIAGNOSIS — Z23 Encounter for immunization: Secondary | ICD-10-CM | POA: Diagnosis not present

## 2020-02-25 DIAGNOSIS — R509 Fever, unspecified: Secondary | ICD-10-CM | POA: Diagnosis not present

## 2020-02-26 ENCOUNTER — Other Ambulatory Visit: Payer: Self-pay | Admitting: Family Medicine

## 2020-02-26 DIAGNOSIS — R1011 Right upper quadrant pain: Secondary | ICD-10-CM

## 2020-03-03 ENCOUNTER — Ambulatory Visit
Admission: RE | Admit: 2020-03-03 | Discharge: 2020-03-03 | Disposition: A | Discharge status: Home / Self Care | Payer: Medicare Other | Source: Ambulatory Visit | Attending: Family Medicine | Admitting: Family Medicine

## 2020-03-03 DIAGNOSIS — R1011 Right upper quadrant pain: Secondary | ICD-10-CM

## 2020-03-03 DIAGNOSIS — K824 Cholesterolosis of gallbladder: Secondary | ICD-10-CM | POA: Diagnosis not present

## 2020-03-17 DIAGNOSIS — I10 Essential (primary) hypertension: Secondary | ICD-10-CM | POA: Diagnosis not present

## 2020-03-17 DIAGNOSIS — R7309 Other abnormal glucose: Secondary | ICD-10-CM | POA: Diagnosis not present

## 2020-03-17 DIAGNOSIS — K862 Cyst of pancreas: Secondary | ICD-10-CM | POA: Diagnosis not present

## 2020-03-17 DIAGNOSIS — R1011 Right upper quadrant pain: Secondary | ICD-10-CM | POA: Diagnosis not present

## 2020-03-17 DIAGNOSIS — N400 Enlarged prostate without lower urinary tract symptoms: Secondary | ICD-10-CM | POA: Diagnosis not present

## 2020-03-17 DIAGNOSIS — H9190 Unspecified hearing loss, unspecified ear: Secondary | ICD-10-CM | POA: Diagnosis not present

## 2020-03-17 DIAGNOSIS — G4700 Insomnia, unspecified: Secondary | ICD-10-CM | POA: Diagnosis not present

## 2020-03-17 DIAGNOSIS — E7800 Pure hypercholesterolemia, unspecified: Secondary | ICD-10-CM | POA: Diagnosis not present

## 2020-05-09 ENCOUNTER — Ambulatory Visit: Payer: Self-pay | Admitting: General Surgery

## 2020-05-09 DIAGNOSIS — K862 Cyst of pancreas: Secondary | ICD-10-CM | POA: Diagnosis not present

## 2020-05-09 DIAGNOSIS — I10 Essential (primary) hypertension: Secondary | ICD-10-CM | POA: Diagnosis not present

## 2020-05-09 DIAGNOSIS — Z9049 Acquired absence of other specified parts of digestive tract: Secondary | ICD-10-CM | POA: Diagnosis not present

## 2020-05-09 DIAGNOSIS — R7989 Other specified abnormal findings of blood chemistry: Secondary | ICD-10-CM | POA: Diagnosis not present

## 2020-05-09 DIAGNOSIS — K824 Cholesterolosis of gallbladder: Secondary | ICD-10-CM | POA: Diagnosis not present

## 2020-05-09 NOTE — H&P (Signed)
Stanley Romero Appointment: 05/09/2020 1:30 PM Location: [facility] Patient #: [id number] DOB: 1945/07/02 Single / Language: Stanley Romero / Race: White Male  History of Present Illness Stanley Hiss M. [name] MD; 05/09/2020 2:07 PM) The patient is a 75 year old male who presents with abdominal pain. He is referred by Dr Stanley Romero for evaluation of upper abdominal pain and a gallbladder polyp. He reports 2 episodes where he had developed severe epigastric right upper quadrant pain along with nausea and vomiting. He had fever of 103 that lasted a few days and uncontrolled chills for a few hours. He has some over-the-counter leg cramp medicine but she took which helped resolve his pain. The first episode was the most severe it was followed by a second episode about 3-4 days later. He saw his primary care team in November when this occurred. They check blood work. His amylase and lipase are normal. CBC was normal. His T bili was elevated at 1.5, AST 49, ALT 13 and alkaline phosphatase is elevated at 369. This prompted an ultrasound which demonstrated an 8 mm gallbladder polyp otherwise unremarkable. He had a remote history of elevated alkaline phosphatase levels when seen at the Stanley Romero which I was able to review one was 231 and the other one was 138. 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 No ductal dilatation. They have been stable in size and he is on a 2 year recall imaging. He changed his diet since those episodes that he had in November. He may have some occasional nausea.  He had remote history of diverticulitis surgery in 2001. He was put on antibiotics for 1 month prior to undergoing surgical resection. He did not have an ostomy.  He denies any chest pain, chest Romero, TIAs or amaurosis fugax.   Problem List/Past Medical Stanley Hiss M. 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 Shoulder Surgery Left. Vasectomy  Diagnostic Studies History Stanley Romero, CMA; 05/09/2020 1:28 PM) Colonoscopy 5-10 years ago  Allergies (Stanley Romero, CMA; 05/09/2020 1:28 PM) No Known Drug Allergies [05/09/2020]: No Known Allergies [05/09/2020]: Allergies Reconciled  Medication History (Stanley Romero, CMA; 05/09/2020 1:28 PM) Zolpidem Tartrate (10MG  Tablet, Oral) Active. Ondansetron (8MG  Tablet Disint, Oral) Active. Telmisartan (80MG  Tablet, Oral) Active. Toviaz (4MG  Tablet ER 24HR, Oral) Active. Atorvastatin Calcium (40MG  Tablet, Oral) Active. Medications Reconciled  Social History Stanley Romero, CMA; 05/09/2020 1:28 PM) Alcohol use Moderate alcohol use. Caffeine use Carbonated beverages. No drug use Tobacco use Never smoker.  Family History Stanley Romero, CMA; 05/09/2020 1:28 PM) Diabetes Mellitus Stanley Romero. Heart Disease Stanley Romero. Heart disease in male family member before age 42  Other Problems Stanley Romero. [name], MD; 05/09/2020 2:10 PM) Diverticulosis Enlarged Prostate Hepatitis Hypercholesterolemia Other disease, cancer, significant illness Stanley Romero PRIMARY HYPERTENSION (I10)     Review of Systems (Stanley Romero CMA; 05/09/2020 1:28 PM) General Present- Appetite Loss and Weight Loss. Not Present- Chills, Fatigue, Fever, Night Sweats and Weight Gain. Skin Not Present- Change in Wart/Mole, Dryness, Hives, Jaundice, New Lesions, Non-Healing Wounds, Rash and Ulcer. HEENT Present- Hearing Loss and Ringing in the Ears. Not Present- Earache, Hoarseness, Nose Bleed, Oral Ulcers, Seasonal Allergies, Sinus Pain, Sore Throat, Visual Disturbances, Wears glasses/contact lenses and Yellow Eyes. Respiratory Not Present- Bloody sputum,  Chronic Cough, Difficulty Breathing, Snoring and Wheezing. Gastrointestinal Present-  Abdominal Pain. Not Present- Bloating, Bloody Stool, Change in Bowel Habits, Chronic diarrhea, Constipation, Difficulty Swallowing, Excessive gas, Gets full quickly at meals, Hemorrhoids, Indigestion, Nausea, Rectal Pain and Vomiting. Male Genitourinary Present- Nocturia and Urgency. Not Present- Blood in Urine, Change in Urinary Stream, Frequency, Impotence, Painful Urination and Urine Leakage. Musculoskeletal Present- Joint Pain. Not Present- Back Pain, Joint Stiffness, Muscle Pain, Muscle Weakness and Swelling of Extremities. Neurological Not Present- Decreased Memory, Fainting, Headaches, Numbness, Seizures, Tingling, Tremor, Trouble walking and Weakness. Psychiatric Not Present- Anxiety, Bipolar, Change in Sleep Pattern, Depression, Fearful and Frequent crying. Hematology Not Present- Blood Thinners, Easy Bruising, Excessive bleeding, Gland problems, HIV and Persistent Infections.  Vitals (Stanley Romero CMA; 05/09/2020 1:29 PM) 05/09/2020 1:29 PM Weight: 186.25 lb Height: 67in Body Surface Area: 1.96 m Body Mass Index: 29.17 kg/m  Temp.: 47F  Pulse: 87 (Regular)  P.OX: 98% (Room air) BP: 124/80(Sitting, Left Arm, Standard)        Physical Exam Stanley Hiss M. Lindell Tussey MD; 05/09/2020 2:07 PM)  General Mental Status-Alert. General Appearance-Consistent with stated age. Hydration-Well hydrated. Voice-Normal.  Head and Neck Head-normocephalic, atraumatic with no lesions or palpable masses. Trachea-midline. Thyroid Gland Characteristics - normal size and consistency.  Eye Eyeball - Bilateral-Extraocular movements intact. Sclera/Conjunctiva - Bilateral-No scleral icterus.  Chest and Lung Exam Chest and lung exam reveals -quiet, even and easy respiratory effort with no use of accessory muscles and on auscultation, normal breath sounds, no adventitious sounds and  normal vocal resonance. Inspection Chest Wall - Normal. Back - normal.  Breast - Did not examine.  Cardiovascular Cardiovascular examination reveals -normal heart sounds, regular rate and rhythm with no murmurs and normal pedal pulses bilaterally.  Abdomen Inspection Inspection of the abdomen reveals - No Hernias. Skin - Scar - Note: Lower midline excision extending slightly above the umbilicus. Palpation/Percussion Palpation and Percussion of the abdomen reveal - Soft, Non Tender, No Rebound tenderness, No Rigidity (guarding) and No hepatosplenomegaly. Auscultation Auscultation of the abdomen reveals - Bowel sounds normal.  Peripheral Vascular Upper Extremity Palpation - Pulses bilaterally normal.  Neurologic Neurologic evaluation reveals -alert and oriented x 3 with no impairment of recent or remote memory. Mental Status-Normal.  Neuropsychiatric The patient's mood and affect are described as -normal. Judgment and Insight-insight is appropriate concerning matters relevant to self.  Musculoskeletal Normal Exam - Left-Upper Extremity Strength Normal and Lower Extremity Strength Normal. Normal Exam - Right-Upper Extremity Strength Normal and Lower Extremity Strength Normal.  Lymphatic Head & Neck  General Head & Neck Lymphatics: Bilateral - Description - Normal. Axillary - Did not examine. Femoral & Inguinal - Did not examine.    Assessment & Plan Stanley Hiss M. 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 He had no evidence of cholecystitis on imaging or an elevated white count. He did have a bump in his LFTs. Nonetheless even if his pain was not due to his gallbladder his, but are still needs to be removed because of the size of the polyp and his age because of the small risk of gallbladder  malignancy.  I believe the patient's symptoms are probably consistent with gallbladder disease.  We discussed gallbladder disease. The patient was given Neurosurgeon. We discussed non-operative and operative management. We discussed the signs & symptoms of acute cholecystitis  I discussed laparoscopic cholecystectomy with IOC in detail. The patient  was given educational material as well as diagrams detailing the procedure. We discussed the risks and benefits of a laparoscopic cholecystectomy including, but not limited to bleeding, infection, injury to surrounding structures such as the intestine or liver, bile leak, retained gallstones, need to convert to an open procedure, prolonged diarrhea, blood clots such as DVT, common bile duct injury, anesthesia risks, and possible need for additional procedures. We discussed the typical post-operative recovery course. I explained that the likelihood of improvement of their symptoms is good.  The patient has elected to proceed with surgery. he wants to wait til march.  This patient encounter took 43 minutes today to perform the following: take history, perform exam, review outside records, interpret imaging, counsel the patient on their diagnosis and document encounter, findings & plan in the EHR  Current Plans Pt Education - Pamphlet Given - Laparoscopic Gallbladder Surgery You are being scheduled for surgery- Our schedulers will call you.  You should hear from our office's scheduling department within 5 working days about the location, date, and time of surgery. We try to make accommodations for patient's preferences in scheduling surgery, but sometimes the OR schedule or the surgeon's schedule prevents [place] from making those accommodations.  If you have not heard from our office [phone] in 5 working days, call the office and ask for your surgeon's nurse.  If you have other questions about your diagnosis, plan, or surgery, call the  office and ask for your surgeon's nurse.   HISTORY OF COLON RESECTION (Z90.49) Impression: I did discuss that he may have scar tissue from his prior surgery that we may have to deal with. 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 [name], MD, FACS General, Bariatric, & Minimally Invasive Surgery [facility], [place]

## 2020-05-27 DIAGNOSIS — H35373 Puckering of macula, bilateral: Secondary | ICD-10-CM | POA: Diagnosis not present

## 2020-05-27 DIAGNOSIS — H26491 Other secondary cataract, right eye: Secondary | ICD-10-CM | POA: Diagnosis not present

## 2020-05-27 DIAGNOSIS — H16223 Keratoconjunctivitis sicca, not specified as Sjogren's, bilateral: Secondary | ICD-10-CM | POA: Diagnosis not present

## 2020-05-27 DIAGNOSIS — H35033 Hypertensive retinopathy, bilateral: Secondary | ICD-10-CM | POA: Diagnosis not present

## 2020-05-27 DIAGNOSIS — H401132 Primary open-angle glaucoma, bilateral, moderate stage: Secondary | ICD-10-CM | POA: Diagnosis not present

## 2020-06-16 NOTE — Patient Instructions (Addendum)
DUE TO COVID-19 ONLY ONE VISITOR IS ALLOWED TO COME WITH YOU AND STAY IN THE WAITING ROOM ONLY DURING PRE OP AND PROCEDURE DAY OF SURGERY. THE 1 VISITOR  MAY VISIT WITH YOU AFTER SURGERY IN YOUR PRIVATE ROOM DURING VISITING HOURS ONLY!  YOU NEED TO HAVE A COVID 19 TEST ON_2/28______ @2 :50 pm_______, THIS TEST MUST BE DONE BEFORE SURGERY,  COVID TESTING SITE [address] [place] [address], IT IS ON THE RIGHT GOING OUT [address] APPROXIMATELY  2 MINUTES PAST [employer] ON THE RIGHT. ONCE YOUR COVID TEST IS COMPLETED,  PLEASE BEGIN THE QUARANTINE INSTRUCTIONS AS OUTLINED IN YOUR HANDOUT.                Tynell Winchell [place]    Your procedure is scheduled on: 06/26/20   Report to [facility] Main  Entrance   Report to admitting at   7:30 AM     Call this number if you have problems the morning of surgery [place], NO CHEWING GUM [place].   No food after midnight.    You may have clear liquid until 6:30 AM.    At 6:00 AM drink pre surgery drink.   Nothing by mouth after 6:30 AM.   Take these medicines the morning of surgery with A SIP OF WATER: Lisbeth Ply                                 You may not have any metal on your body including              piercings  Do not wear jewelry,  lotions, powders or deodorant                      Men may shave face and neck.   Do not bring valuables to the hospital. [place].  Contacts, dentures or bridgework may not be worn into surgery.      Patients discharged the day of surgery will not be allowed to drive home.   IF YOU ARE HAVING SURGERY AND GOING HOME THE SAME DAY, YOU MUST HAVE AN ADULT TO DRIVE YOU HOME AND BE WITH YOU FOR 24 HOURS.   YOU MAY GO HOME BY TAXI OR UBER OR ORTHERWISE, BUT AN ADULT MUST ACCOMPANY YOU HOME AND STAY WITH YOU FOR 24 HOURS.  Name and phone number of your  driver:  Special Instructions: N/A              Please read over the following fact sheets you were given: _____________________________________________________________________             [facility] - Preparing for Surgery Before surgery, you can play an important role.  Because skin is not sterile, your skin needs to be as free of germs as possible.  You can reduce the number of germs on your skin by washing with CHG (chlorahexidine gluconate) soap before surgery.  CHG is an antiseptic cleaner which kills germs and bonds with the skin to continue killing germs even after washing. Please DO NOT use if you have an allergy to CHG or antibacterial soaps.  If your skin becomes reddened/irritated stop using the CHG and inform your  nurse when you arrive at Short Stay. Do not shave (including legs and underarms) for at least 48 hours prior to the first CHG shower.  You may shave your face/neck. Please follow these instructions carefully:  1.  Shower with CHG Soap the night before surgery and the  morning of Surgery.  2.  If you choose to wash your hair, wash your hair first as usual with your  normal  shampoo.  3.  After you shampoo, rinse your hair and body thoroughly to remove the  shampoo.                                        4.  Use CHG as you would any other liquid soap.  You can apply chg directly  to the skin and wash                       Gently with a scrungie or clean washcloth.  5.  Apply the CHG Soap to your body ONLY FROM THE NECK DOWN.   Do not use on face/ open                           Wound or open sores. Avoid contact with eyes, ears mouth and genitals (private parts).                       Wash face,  Genitals (private parts) with your normal soap.             6.  Wash thoroughly, paying special attention to the area where your surgery  will be performed.  7.  Thoroughly rinse your body with warm water from the neck down.  8.  DO NOT shower/wash with your normal soap after  using and rinsing off  the CHG Soap.             9.  Pat yourself dry with a clean towel.            10.  Wear clean pajamas.            11.  Place clean sheets on your bed the night of your first shower and do not  sleep with pets. Day of Surgery : Do not apply any lotions/deodorants the morning of surgery.  Please wear clean clothes to the hospital/surgery center.  FAILURE TO FOLLOW THESE INSTRUCTIONS MAY RESULT IN THE CANCELLATION OF YOUR SURGERY PATIENT SIGNATURE_________________________________  NURSE SIGNATURE__________________________________  ________________________________________________________________________

## 2020-06-17 ENCOUNTER — Encounter (HOSPITAL_COMMUNITY)
Admission: RE | Admit: 2020-06-17 | Discharge: 2020-06-17 | Disposition: A | Discharge status: Home / Self Care | Payer: Medicare Other | Source: Ambulatory Visit | Attending: General Surgery | Admitting: General Surgery

## 2020-06-17 ENCOUNTER — Encounter (HOSPITAL_COMMUNITY): Payer: Self-pay

## 2020-06-17 ENCOUNTER — Other Ambulatory Visit: Payer: Self-pay

## 2020-06-17 DIAGNOSIS — I10 Essential (primary) hypertension: Secondary | ICD-10-CM | POA: Diagnosis not present

## 2020-06-17 DIAGNOSIS — Z01818 Encounter for other preprocedural examination: Secondary | ICD-10-CM | POA: Diagnosis not present

## 2020-06-17 HISTORY — DX: Essential (primary) hypertension: I10

## 2020-06-17 LAB — COMPREHENSIVE METABOLIC PANEL
ALT: 23 U/L (ref 0–44)
AST: 24 U/L (ref 15–41)
Albumin: 3.7 g/dL (ref 3.5–5.0)
Alkaline Phosphatase: 109 U/L (ref 38–126)
Anion gap: 10 (ref 5–15)
BUN: 19 mg/dL (ref 8–23)
CO2: 25 mmol/L (ref 22–32)
Calcium: 9.1 mg/dL (ref 8.9–10.3)
Chloride: 104 mmol/L (ref 98–111)
Creatinine, Ser: 0.87 mg/dL (ref 0.61–1.24)
GFR, Estimated: >60 mL/min (ref >60)
Glucose, Bld: 133 mg/dL — ABNORMAL HIGH (ref 70–99)
Potassium: 4.2 mmol/L (ref 3.5–5.1)
Sodium: 139 mmol/L (ref 135–145)
Total Bilirubin: 0.8 mg/dL (ref 0.3–1.2)
Total Protein: 6.4 g/dL — ABNORMAL LOW (ref 6.5–8.1)

## 2020-06-17 LAB — CBC
HCT: 45.9 % (ref 39.0–52.0)
Hemoglobin: 15.4 g/dL (ref 13.0–17.0)
MCH: 33.2 pg (ref 26.0–34.0)
MCHC: 33.6 g/dL (ref 30.0–36.0)
MCV: 98.9 fL (ref 80.0–100.0)
Platelets: 192 10*3/uL (ref 150–400)
RBC: 4.64 MIL/uL (ref 4.22–5.81)
RDW: 13.2 % (ref 11.5–15.5)
WBC: 4.6 10*3/uL (ref 4.0–10.5)
nRBC: 0 % (ref 0.0–0.2)

## 2020-06-17 NOTE — Progress Notes (Signed)
COVID Vaccine Completed:Yes Date COVID Vaccine completed:07/01/19-booster 11/01.21 COVID vaccine manufacturer: Pfizer     PCP - Dr. Lenard Simmer Cardiologist - none  Chest x-ray - no EKG - 06/17/20 chart and epic Stress Test - no ECHO - no Cardiac Cath - no Pacemaker/ICD device last checked:NA  Sleep Study - yes- negative results CPAP - no  Fasting Blood Sugar - NA Checks Blood Sugar _____ times a day  Blood Thinner Instructions:ASA/ Reade Aspirin Instructions:none Last Dose:06/22/20  Anesthesia review:   Patient denies shortness of breath, fever, cough and chest pain at PAT appointment  yes Patient verbalized understanding of instructions that were given to them at the PAT appointment. Patient was also instructed that they will need to review over the PAT instructions again at home before surgery. Yes. Pt is very fit and has no SOB with any activities.

## 2020-06-23 ENCOUNTER — Other Ambulatory Visit (HOSPITAL_COMMUNITY)
Admission: RE | Admit: 2020-06-23 | Discharge: 2020-06-23 | Disposition: A | Discharge status: Home / Self Care | Payer: Medicare Other | Source: Ambulatory Visit | Attending: General Surgery | Admitting: General Surgery

## 2020-06-23 DIAGNOSIS — Z20822 Contact with and (suspected) exposure to covid-19: Secondary | ICD-10-CM | POA: Diagnosis not present

## 2020-06-23 DIAGNOSIS — Z01812 Encounter for preprocedural laboratory examination: Secondary | ICD-10-CM | POA: Diagnosis not present

## 2020-06-24 LAB — SARS CORONAVIRUS 2 (TAT 6-24 HRS): SARS Coronavirus 2: NEGATIVE

## 2020-06-26 ENCOUNTER — Ambulatory Visit (HOSPITAL_COMMUNITY): Payer: Medicare Other | Admitting: Anesthesiology

## 2020-06-26 ENCOUNTER — Encounter (HOSPITAL_COMMUNITY)
Admission: RE | Disposition: A | Discharge status: Home / Self Care | Payer: Self-pay | Source: Home / Self Care | Attending: General Surgery

## 2020-06-26 ENCOUNTER — Ambulatory Visit (HOSPITAL_COMMUNITY)
Admission: RE | Admit: 2020-06-26 | Discharge: 2020-06-26 | Disposition: A | Discharge status: Home / Self Care | Payer: Medicare Other | Attending: General Surgery | Admitting: General Surgery

## 2020-06-26 ENCOUNTER — Encounter (HOSPITAL_COMMUNITY): Payer: Self-pay | Admitting: General Surgery

## 2020-06-26 DIAGNOSIS — Z87442 Personal history of urinary calculi: Secondary | ICD-10-CM | POA: Insufficient documentation

## 2020-06-26 DIAGNOSIS — R1011 Right upper quadrant pain: Secondary | ICD-10-CM | POA: Diagnosis not present

## 2020-06-26 DIAGNOSIS — K824 Cholesterolosis of gallbladder: Secondary | ICD-10-CM | POA: Diagnosis not present

## 2020-06-26 DIAGNOSIS — K660 Peritoneal adhesions (postprocedural) (postinfection): Secondary | ICD-10-CM | POA: Diagnosis not present

## 2020-06-26 DIAGNOSIS — K8010 Calculus of gallbladder with chronic cholecystitis without obstruction: Secondary | ICD-10-CM | POA: Insufficient documentation

## 2020-06-26 DIAGNOSIS — I10 Essential (primary) hypertension: Secondary | ICD-10-CM | POA: Diagnosis not present

## 2020-06-26 HISTORY — PX: CHOLECYSTECTOMY: SHX55

## 2020-06-26 SURGERY — LAPAROSCOPIC CHOLECYSTECTOMY WITH INTRAOPERATIVE CHOLANGIOGRAM
Anesthesia: General

## 2020-06-26 MED ORDER — CHLORHEXIDINE GLUCONATE CLOTH 2 % EX PADS: 6.0000 | MEDICATED_PAD | Freq: Once | CUTANEOUS | Status: DC

## 2020-06-26 MED ORDER — LACTATED RINGERS IR SOLN
Status: DC | PRN
  Administered 2020-06-26: 3000 mL

## 2020-06-26 MED ORDER — KETAMINE HCL 10 MG/ML IJ SOLN
INTRAMUSCULAR | Status: AC
  Filled 2020-06-26: qty 1

## 2020-06-26 MED ORDER — LIDOCAINE 2% (20 MG/ML) 5 ML SYRINGE
INTRAMUSCULAR | Status: DC | PRN
  Administered 2020-06-26: 60 mg via INTRAVENOUS

## 2020-06-26 MED ORDER — BUPIVACAINE-EPINEPHRINE 0.25% -1:200000 IJ SOLN
INTRAMUSCULAR | Status: DC | PRN
  Administered 2020-06-26: 30 mL

## 2020-06-26 MED ORDER — OXYCODONE HCL 5 MG PO TABS: 5.0000 mg | ORAL_TABLET | Freq: Four times a day (QID) | ORAL | 0 refills | Status: DC | PRN

## 2020-06-26 MED ORDER — OXYCODONE HCL 5 MG PO TABS
5.0000 mg | ORAL_TABLET | Freq: Once | ORAL | Status: DC | PRN
Start: 2020-06-26 — End: 2020-06-26

## 2020-06-26 MED ORDER — OXYCODONE HCL 5 MG/5ML PO SOLN: 5.0000 mg | Freq: Once | ORAL | Status: DC | PRN

## 2020-06-26 MED ORDER — ENSURE PRE-SURGERY PO LIQD
296.0000 mL | Freq: Once | ORAL | Status: DC
  Filled 2020-06-26: qty 296

## 2020-06-26 MED ORDER — ORAL CARE MOUTH RINSE: 15.0000 mL | Freq: Once | OROMUCOSAL | Status: DC

## 2020-06-26 MED ORDER — FENTANYL CITRATE (PF) 250 MCG/5ML IJ SOLN
INTRAMUSCULAR | Status: DC | PRN
  Administered 2020-06-26 (×2): 50 ug via INTRAVENOUS

## 2020-06-26 MED ORDER — MIDAZOLAM HCL 2 MG/2ML IJ SOLN
INTRAMUSCULAR | Status: DC | PRN
  Administered 2020-06-26: 2 mg via INTRAVENOUS

## 2020-06-26 MED ORDER — ONDANSETRON HCL 4 MG/2ML IJ SOLN: 4.0000 mg | Freq: Once | INTRAMUSCULAR | Status: DC | PRN

## 2020-06-26 MED ORDER — DEXAMETHASONE SODIUM PHOSPHATE 10 MG/ML IJ SOLN
INTRAMUSCULAR | Status: DC | PRN
  Administered 2020-06-26: 10 mg via INTRAVENOUS

## 2020-06-26 MED ORDER — ACETAMINOPHEN 500 MG PO TABS
1000.0000 mg | ORAL_TABLET | ORAL | Status: AC
  Administered 2020-06-26: 1000 mg via ORAL
  Filled 2020-06-26: qty 2

## 2020-06-26 MED ORDER — FENTANYL CITRATE (PF) 100 MCG/2ML IJ SOLN
INTRAMUSCULAR | Status: AC
  Filled 2020-06-26: qty 2

## 2020-06-26 MED ORDER — KETOROLAC TROMETHAMINE 30 MG/ML IJ SOLN
INTRAMUSCULAR | Status: AC
  Filled 2020-06-26: qty 1

## 2020-06-26 MED ORDER — KETOROLAC TROMETHAMINE 15 MG/ML IJ SOLN
INTRAMUSCULAR | Status: DC | PRN
  Administered 2020-06-26: 15 mg via INTRAVENOUS

## 2020-06-26 MED ORDER — LIDOCAINE HCL 2 % IJ SOLN
INTRAMUSCULAR | Status: AC
  Filled 2020-06-26: qty 20

## 2020-06-26 MED ORDER — ROCURONIUM BROMIDE 10 MG/ML (PF) SYRINGE
PREFILLED_SYRINGE | INTRAVENOUS | Status: AC
  Filled 2020-06-26: qty 10

## 2020-06-26 MED ORDER — BUPIVACAINE-EPINEPHRINE (PF) 0.25% -1:200000 IJ SOLN
INTRAMUSCULAR | Status: AC
  Filled 2020-06-26: qty 30

## 2020-06-26 MED ORDER — MIDAZOLAM HCL 2 MG/2ML IJ SOLN
INTRAMUSCULAR | Status: AC
  Filled 2020-06-26: qty 2

## 2020-06-26 MED ORDER — SODIUM CHLORIDE 0.9 % IV SOLN
2.0000 g | INTRAVENOUS | Status: AC
  Administered 2020-06-26: 2 g via INTRAVENOUS
  Filled 2020-06-26: qty 2

## 2020-06-26 MED ORDER — PROPOFOL 10 MG/ML IV BOLUS
INTRAVENOUS | Status: AC
  Filled 2020-06-26: qty 20

## 2020-06-26 MED ORDER — ROCURONIUM BROMIDE 10 MG/ML (PF) SYRINGE
PREFILLED_SYRINGE | INTRAVENOUS | Status: DC | PRN
  Administered 2020-06-26: 70 mg via INTRAVENOUS

## 2020-06-26 MED ORDER — LIDOCAINE HCL (PF) 2 % IJ SOLN
INTRAMUSCULAR | Status: DC | PRN
  Administered 2020-06-26: 1.5 mg/kg/h via INTRADERMAL

## 2020-06-26 MED ORDER — LIDOCAINE HCL (PF) 2 % IJ SOLN
INTRAMUSCULAR | Status: AC
  Filled 2020-06-26: qty 5

## 2020-06-26 MED ORDER — CHLORHEXIDINE GLUCONATE 0.12 % MT SOLN: 15.0000 mL | Freq: Once | OROMUCOSAL | Status: DC

## 2020-06-26 MED ORDER — ONDANSETRON HCL 4 MG/2ML IJ SOLN
INTRAMUSCULAR | Status: AC
  Filled 2020-06-26: qty 2

## 2020-06-26 MED ORDER — PROPOFOL 10 MG/ML IV BOLUS
INTRAVENOUS | Status: DC | PRN
  Administered 2020-06-26: 150 mg via INTRAVENOUS

## 2020-06-26 MED ORDER — LACTATED RINGERS IV SOLN: INTRAVENOUS | Status: DC

## 2020-06-26 MED ORDER — SUGAMMADEX SODIUM 200 MG/2ML IV SOLN
INTRAVENOUS | Status: DC | PRN
  Administered 2020-06-26: 200 mg via INTRAVENOUS

## 2020-06-26 MED ORDER — KETAMINE HCL 10 MG/ML IJ SOLN
INTRAMUSCULAR | Status: DC | PRN
  Administered 2020-06-26: 20 mg via INTRAVENOUS

## 2020-06-26 MED ORDER — EPHEDRINE SULFATE 50 MG/ML IJ SOLN
INTRAMUSCULAR | Status: DC | PRN
  Administered 2020-06-26 (×3): 5 mg via INTRAVENOUS
  Administered 2020-06-26: 10 mg via INTRAVENOUS

## 2020-06-26 MED ORDER — ONDANSETRON HCL 4 MG/2ML IJ SOLN
INTRAMUSCULAR | Status: DC | PRN
  Administered 2020-06-26: 4 mg via INTRAVENOUS

## 2020-06-26 MED ORDER — HYDROMORPHONE HCL 1 MG/ML IJ SOLN: 0.2500 mg | INTRAMUSCULAR | Status: DC | PRN

## 2020-06-26 MED ORDER — DEXAMETHASONE SODIUM PHOSPHATE 10 MG/ML IJ SOLN
INTRAMUSCULAR | Status: AC
  Filled 2020-06-26: qty 1

## 2020-06-26 MED ORDER — 0.9 % SODIUM CHLORIDE (POUR BTL) OPTIME
TOPICAL | Status: DC | PRN
  Administered 2020-06-26: 1000 mL

## 2020-06-26 SURGICAL SUPPLY — 59 items
ADH SKN CLS APL DERMABOND .7 (GAUZE/BANDAGES/DRESSINGS)
APL PRP STRL LF DISP 70% ISPRP (MISCELLANEOUS) ×1
APL SKNCLS STERI-STRIP NONHPOA (GAUZE/BANDAGES/DRESSINGS)
APL SRG 38 LTWT LNG FL B (MISCELLANEOUS)
APPLICATOR ARISTA FLEXITIP XL (MISCELLANEOUS) IMPLANT
APPLIER CLIP 5 13 M/L LIGAMAX5 (MISCELLANEOUS)
APPLIER CLIP ROT 10 11.4 M/L (STAPLE) ×2
APR CLP MED LRG 11.4X10 (STAPLE) ×1
APR CLP MED LRG 5 ANG JAW (MISCELLANEOUS)
BAG SPEC RTRVL 10 TROC 200 (ENDOMECHANICALS) ×1
BENZOIN TINCTURE PRP APPL 2/3 (GAUZE/BANDAGES/DRESSINGS) IMPLANT
BNDG ADH 1X3 SHEER STRL LF (GAUZE/BANDAGES/DRESSINGS) ×4 IMPLANT
BNDG ADH THN 3X1 STRL LF (GAUZE/BANDAGES/DRESSINGS)
CABLE HIGH FREQUENCY MONO STRZ (ELECTRODE) ×2 IMPLANT
CHLORAPREP W/TINT 26 (MISCELLANEOUS) ×2 IMPLANT
CLIP APPLIE 5 13 M/L LIGAMAX5 (MISCELLANEOUS) IMPLANT
CLIP APPLIE ROT 10 11.4 M/L (STAPLE) IMPLANT
CLIP VESOLOCK MED LG 6/CT (CLIP) IMPLANT
CLSR STERI-STRIP ANTIMIC 1/2X4 (GAUZE/BANDAGES/DRESSINGS) ×1 IMPLANT
COVER MAYO STAND STRL (DRAPES) IMPLANT
COVER SURGICAL LIGHT HANDLE (MISCELLANEOUS) ×2 IMPLANT
COVER WAND RF STERILE (DRAPES) IMPLANT
DECANTER SPIKE VIAL GLASS SM (MISCELLANEOUS) ×2 IMPLANT
DERMABOND ADVANCED (GAUZE/BANDAGES/DRESSINGS)
DERMABOND ADVANCED .7 DNX12 (GAUZE/BANDAGES/DRESSINGS) IMPLANT
DRAPE C-ARM 42X120 X-RAY (DRAPES) IMPLANT
DRSG TEGADERM 2-3/8X2-3/4 SM (GAUZE/BANDAGES/DRESSINGS) ×1 IMPLANT
ELECT REM PT RETURN 15FT ADLT (MISCELLANEOUS) ×2 IMPLANT
GAUZE SPONGE 2X2 8PLY STRL LF (GAUZE/BANDAGES/DRESSINGS) IMPLANT
GLOVE INDICATOR 8.0 STRL GRN (GLOVE) ×2 IMPLANT
GLOVE SURG ENC MOIS LTX SZ7.5 (GLOVE) ×2 IMPLANT
GOWN STRL REUS W/TWL XL LVL3 (GOWN DISPOSABLE) ×6 IMPLANT
GRASPER SUT TROCAR 14GX15 (MISCELLANEOUS) ×1 IMPLANT
HEMOSTAT ARISTA ABSORB 3G PWDR (HEMOSTASIS) IMPLANT
HEMOSTAT SNOW SURGICEL 2X4 (HEMOSTASIS) IMPLANT
KIT BASIN OR (CUSTOM PROCEDURE TRAY) ×2 IMPLANT
KIT TURNOVER KIT A (KITS) ×2 IMPLANT
L-HOOK LAP DISP 36CM (ELECTROSURGICAL)
LHOOK LAP DISP 36CM (ELECTROSURGICAL) IMPLANT
POUCH RETRIEVAL ECOSAC 10 (ENDOMECHANICALS) ×1 IMPLANT
POUCH RETRIEVAL ECOSAC 10MM (ENDOMECHANICALS) ×2
SCISSORS LAP 5X35 DISP (ENDOMECHANICALS) ×2 IMPLANT
SET CHOLANGIOGRAPH MIX (MISCELLANEOUS) IMPLANT
SET IRRIG TUBING LAPAROSCOPIC (IRRIGATION / IRRIGATOR) ×2 IMPLANT
SET TUBE SMOKE EVAC HIGH FLOW (TUBING) ×2 IMPLANT
SLEEVE XCEL OPT CAN 5 100 (ENDOMECHANICALS) ×4 IMPLANT
SPONGE GAUZE 2X2 8PLY STRL LF (GAUZE/BANDAGES/DRESSINGS) ×1 IMPLANT
SPONGE GAUZE 2X2 STER 10/PKG (GAUZE/BANDAGES/DRESSINGS)
STRIP CLOSURE SKIN 1/2X4 (GAUZE/BANDAGES/DRESSINGS) IMPLANT
SUT MNCRL AB 4-0 PS2 18 (SUTURE) ×2 IMPLANT
SUT VIC AB 0 UR5 27 (SUTURE) ×1 IMPLANT
SUT VICRYL 0 TIES 12 18 (SUTURE) IMPLANT
SUT VICRYL 0 UR6 27IN ABS (SUTURE) ×1 IMPLANT
TOWEL OR 17X26 10 PK STRL BLUE (TOWEL DISPOSABLE) ×2 IMPLANT
TOWEL OR NON WOVEN STRL DISP B (DISPOSABLE) ×2 IMPLANT
TRAY LAPAROSCOPIC (CUSTOM PROCEDURE TRAY) ×2 IMPLANT
TROCAR BLADELESS OPT 5 100 (ENDOMECHANICALS) ×2 IMPLANT
TROCAR XCEL BLUNT TIP 100MML (ENDOMECHANICALS) IMPLANT
TROCAR XCEL NON-BLD 11X100MML (ENDOMECHANICALS) IMPLANT

## 2020-06-26 NOTE — Anesthesia Procedure Notes (Signed)
Procedure Name: Intubation Date/Time: 06/26/2020 9:42 AM Performed by: Michele Rockers, CRNA Pre-anesthesia Checklist: Patient identified, Patient being monitored, Timeout performed, Emergency Drugs available and Suction available Patient Re-evaluated:Patient Re-evaluated prior to induction Oxygen Delivery Method: Circle System Utilized Preoxygenation: Pre-oxygenation with 100% oxygen Induction Type: IV induction Ventilation: Mask ventilation without difficulty Laryngoscope Size: Miller and 2 Grade View: Grade I Tube type: Oral Tube size: 7.5 mm Number of attempts: 1 Airway Equipment and Method: Stylet Placement Confirmation: ETT inserted through vocal cords under direct vision,  positive ETCO2 and breath sounds checked- equal and bilateral Secured at: 22 cm Tube secured with: Tape Dental Injury: Teeth and Oropharynx as per pre-operative assessment

## 2020-06-26 NOTE — Op Note (Addendum)
Stanley Romero [id number] 01-28-1946 06/26/2020  Laparoscopic Cholecystectomy  Procedure Note  Indications: This patient presents with symptomatic gallbladder disease and will undergo laparoscopic cholecystectomy.  Pre-operative Diagnosis: gallbladder polyp, ruq pain  Post-operative Diagnosis: Same  Surgeon: Greer Pickerel MD FACS  Assistants:  Jerilee Hoh MD (Surgery resident, PGY-6)  Anesthesia: General endotracheal anesthesia  Procedure Details  The patient was seen again in the Holding Room. The risks, benefits, complications, treatment options, and expected outcomes were discussed with the patient. The possibilities of reaction to medication, pulmonary aspiration, perforation of viscus, bleeding, recurrent infection, finding a normal gallbladder, the need for additional procedures, failure to diagnose a condition, the possible need to convert to an open procedure, and creating a complication requiring transfusion or operation were discussed with the patient. The likelihood of improving the patient's symptoms with return to their baseline status is good.  The patient and/or family concurred with the proposed plan, giving informed consent. The site of surgery properly noted. The patient was taken to Operating Room, identified as Stanley Romero and the procedure verified as Laparoscopic Cholecystectomy with possible Intraoperative Cholangiogram. A Time Out was held and the above information confirmed. Antibiotic prophylaxis was administered.   Prior to the induction of general anesthesia, antibiotic prophylaxis was administered. General endotracheal anesthesia was then administered and tolerated well. After the induction, the abdomen was prepped with Chloraprep and draped in the sterile fashion. The patient was positioned in the supine position.  Patient had a prior lower midline incision extending above the umbilicus from prior sigmoid colectomy. Therefore we obtain access to the abdomen in  the left upper quadrant using Optiview technique. A small incision was made in the left midclavicular line just below the left subcostal margin. Then using a 0 degree 5 mm laparoscope through a 5 Miller trocar I advanced it through all layers of the abdominal wall and carefully entered the abdominal cavity. Pneumoperitoneum was smoothly established up to a patient pressure of 15 mmHg without any change in patient vital signs. Laparoscope was advanced and the abdominal cavity was surveilled. He had a fair amount of adhesions starting at the umbilicus all the way down to the lower midline. Some of this was omentum and some of this was small intestine. There was an area of free abdominal wall in the right upper abdomen  Two 5 mm trochars were placed in the right abdomen under direct visualization after local been infiltrated. We then placed the camera in the right abdomen and visualized the optical entry site. There is no evidence of injury to surrounding structures. The 5 mm left upper quadrant trocar was exchanged for a 12 mm trocar. Patient was placed in reverse Trendelenburg and rotated to the left. There was some free space in the upper midline just below the falciform where we placed our 5 mm trocar for the camera.     The gallbladder was identified, the fundus grasped and retracted cephalad. Adhesions were lysed bluntly and with the electrocautery where indicated by the resident, taking care not to injure any adjacent organs or viscus. The infundibulum was grasped and retracted laterally, exposing the peritoneum overlying the triangle of Calot. This was then divided and exposed in a blunt fashion. A critical view of the cystic duct and cystic artery was obtained.  The cystic duct was clearly identified and bluntly dissected circumferentially. The cystic duct was ligated with a clip distally.     The cystic duct was then ligated with clips and divided. The cystic artery  which had been identified &  dissected free was ligated with clips and divided as well.   The gallbladder was dissected from the liver bed in retrograde fashion with the electrocautery. The gallbladder was removed and placed in an Ecco sac.  The liver bed was irrigated and inspected. Hemostasis was achieved with the electrocautery. Copious irrigation was utilized and was repeatedly aspirated until clear. The gallbladder and Ecco sac were then removed through the LUQ port site after enlarging the fascial incision slightly. The fascial defect was then closed with 2 interrupted 0 Vicryl was using the PMI suture passer with laparoscopic guidance   We again inspected the right upper quadrant for hemostasis.  . Pneumoperitoneum was released as we removed the trocars.  4-0 Monocryl was used to close the skin by the resident.    steri-strips, and clean dressings were applied. The patient was then extubated and brought to the recovery room in stable condition. Instrument, sponge, and needle counts were correct at closure and at the conclusion of the case.   Findings: Intra-abdominal adhesions to umbilicus and extending toward the lower midline. Positive critical view Probable gallstones with gallbladder  I was personally present & scrubbed during the entire procedure except skin closure and immediately available afterwards, as documented in my operative note.  Estimated Blood Loss: Minimal         Drains: none         Specimens: Gallbladder           Complications: None; patient tolerated the procedure well.         Disposition: PACU - hemodynamically stable.         Condition: stable  Stanley Ruff. [name], MD, FACS General, Bariatric, & Minimally Invasive Surgery [facility], [place]

## 2020-06-26 NOTE — Discharge Instructions (Signed)
General Anesthesia, Adult, Care After This sheet gives you information about how to care for yourself after your procedure. Your health care provider may also give you more specific instructions. If you have problems or questions, contact your health care provider. What can I expect after the procedure? After the procedure, the following side effects are common:  Pain or discomfort at the IV site.  Nausea.  Vomiting.  Sore throat.  Trouble concentrating.  Feeling cold or chills.  Feeling weak or tired.  Sleepiness and fatigue.  Soreness and body aches. These side effects can affect parts of the body that were not involved in surgery. Follow these instructions at home: For the time period you were told by your health care provider:  Rest.  Do not participate in activities where you could fall or become injured.  Do not drive or use machinery.  Do not drink alcohol.  Do not take sleeping pills or medicines that cause drowsiness.  Do not make important decisions or sign legal documents.  Do not take care of children on your own.   Eating and drinking  Follow any instructions from your health care provider about eating or drinking restrictions.  When you feel hungry, start by eating small amounts of foods that are soft and easy to digest (bland), such as toast. Gradually return to your regular diet.  Drink enough fluid to keep your urine pale yellow.  If you vomit, rehydrate by drinking water, juice, or clear broth. General instructions  If you have sleep apnea, surgery and certain medicines can increase your risk for breathing problems. Follow instructions from your health care provider about wearing your sleep device: ? Anytime you are sleeping, including during daytime naps. ? While taking prescription pain medicines, sleeping medicines, or medicines that make you drowsy.  Have a responsible adult stay with you for the time you are told. It is important to have  someone help care for you until you are awake and alert.  Return to your normal activities as told by your health care provider. Ask your health care provider what activities are safe for you.  Take over-the-counter and prescription medicines only as told by your health care provider.  If you smoke, do not smoke without supervision.  Keep all follow-up visits as told by your health care provider. This is important. Contact a health care provider if:  You have nausea or vomiting that does not get better with medicine.  You cannot eat or drink without vomiting.  You have pain that does not get better with medicine.  You are unable to pass urine.  You develop a skin rash.  You have a fever.  You have redness around your IV site that gets worse. Get help right away if:  You have difficulty breathing.  You have chest pain.  You have blood in your urine or stool, or you vomit blood. Summary  After the procedure, it is common to have a sore throat or nausea. It is also common to feel tired.  Have a responsible adult stay with you for the time you are told. It is important to have someone help care for you until you are awake and alert.  When you feel hungry, start by eating small amounts of foods that are soft and easy to digest (bland), such as toast. Gradually return to your regular diet.  Drink enough fluid to keep your urine pale yellow.  Return to your normal activities as told by your health care provider.   Ask your health care provider what activities are safe for you. This information is not intended to replace advice given to you by your health care provider. Make sure you discuss any questions you have with your health care provider. Document Revised: 12/27/2019 Document Reviewed: 07/26/2019 Elsevier Patient Education  2021 [employer] LAPAROSCOPIC SURGERY: POST OP INSTRUCTIONS Always review your discharge instruction sheet given  to you by the facility where your surgery was performed. IF YOU HAVE DISABILITY OR FAMILY LEAVE FORMS, YOU MUST BRING THEM TO THE OFFICE FOR PROCESSING.   DO NOT GIVE THEM TO YOUR DOCTOR.  PAIN CONTROL  1. First take acetaminophen (Tylenol) AND/or ibuprofen (Advil) to control your pain after surgery.  Follow directions on package.  Taking acetaminophen (Tylenol) and/or ibuprofen (Advil) regularly after surgery will help to control your pain and lower the amount of prescription pain medication you may need.  You should not take more than 3,000 mg (3 grams) of acetaminophen (Tylenol) in 24 hours.  You should not take ibuprofen (Advil), aleve, motrin, naprosyn or other NSAIDS if you have a history of stomach ulcers or chronic kidney disease.  2. A prescription for pain medication may be given to you upon discharge.  Take your pain medication as prescribed, if you still have uncontrolled pain after taking acetaminophen (Tylenol) or ibuprofen (Advil). 3. Use ice packs to help control pain. 4. If you need a refill on your pain medication, please contact your pharmacy.  They will contact our office to request authorization. Prescriptions will not be filled after 5pm or on week-ends.  HOME MEDICATIONS 5. Take your usually prescribed medications unless otherwise directed.  DIET 6. You should follow a light diet the first few days after arrival home.  Be sure to include lots of fluids daily. Avoid fatty, fried foods.   CONSTIPATION 7. It is common to experience some constipation after surgery and if you are taking pain medication.  Increasing fluid intake and taking a stool softener (such as Colace) will usually help or prevent this problem from occurring.  A mild laxative (Milk of Magnesia or Miralax) should be taken according to package instructions if there are no bowel movements after 48 hours.  WOUND/INCISION CARE 8. Most patients will experience some swelling and bruising in the area of the  incisions.  Ice packs will help.  Swelling and bruising can take several days to resolve.  9. Unless discharge instructions indicate otherwise, follow guidelines below  a. STERI-STRIPS - you may remove your outer bandages 48 hours after surgery, and you may shower at that time.  You have steri-strips (small skin tapes) in place directly over the incision.  These strips should be left on the skin for 7-10 days.   b. DERMABOND/SKIN GLUE - you may shower in 24 hours.  The glue will flake off over the next 2-3 weeks. 10. Any sutures or staples will be removed at the office during your follow-up visit.  ACTIVITIES 11. You may resume regular (light) daily activities beginning the next day--such as daily self-care, walking, climbing stairs--gradually increasing activities as tolerated.  You may have sexual intercourse when it is comfortable.  Refrain from any heavy lifting or straining until approved by your doctor. a. You may drive when you are no longer taking prescription pain medication, you can comfortably wear a seatbelt, and you can safely maneuver your car and apply brakes.  FOLLOW-UP 12. You should see your doctor in the office for a follow-up appointment  approximately 2-3 weeks after your surgery.  You should have been given your post-op/follow-up appointment when your surgery was scheduled.  If you did not receive a post-op/follow-up appointment, make sure that you call for this appointment within a day or two after you arrive home to insure a convenient appointment time.  OTHER INSTRUCTIONS 13.   WHEN TO CALL YOUR DOCTOR: 1. Fever over 101.0 2. Inability to urinate 3. Continued bleeding from incision. 4. Increased pain, redness, or drainage from the incision. 5. Increasing abdominal pain  The clinic staff is available to answer your questions during regular business hours.  Please don't hesitate to call and ask to speak to one of the nurses for clinical concerns.  If you have a medical  emergency, go to the nearest emergency room or call 911.  A surgeon from [facility] is always on call at the hospital. [address] [place], [place], [place]  [address] ? [address], [place], [place]   [address] [phone] ? [phone] ? FAX [phone] Web site: [email or web address]  ........Marland Kitchen   Managing Your Pain After Surgery Without Opioids    Thank you for participating in our program to help patients manage their pain after surgery without opioids. This is part of our effort to provide you with the best care possible, without exposing you or your family to the risk that opioids pose.  What pain can I expect after surgery? You can expect to have some pain after surgery. This is normal. The pain is typically worse the day after surgery, and quickly begins to get better. Many studies have found that many patients are able to manage their pain after surgery with Over-the-Counter (OTC) medications such as Tylenol and Motrin. If you have a condition that does not allow you to take Tylenol or Motrin, notify your surgical team.  How will I manage my pain? The best strategy for controlling your pain after surgery is around the clock pain control with Tylenol (acetaminophen) and Motrin (ibuprofen or Advil). Alternating these medications with each other allows you to maximize your pain control. In addition to Tylenol and Motrin, you can use heating pads or ice packs on your incisions to help reduce your pain.  How will I alternate your regular strength over-the-counter pain medication? You will take a dose of pain medication every three hours. ; Start by taking 650 mg of Tylenol (2 pills of 325 mg) ; 3 hours later take 600 mg of Motrin (3 pills of 200 mg) ; 3 hours after taking the Motrin take 650 mg of Tylenol ; 3 hours after that take 600 mg of Motrin.   - 1 -  See example - if your first dose of Tylenol is at 12:00 PM   12:00 PM Tylenol 650 mg  (2 pills of 325 mg)  3:00 PM Motrin 600 mg (3 pills of 200 mg)  6:00 PM Tylenol 650 mg (2 pills of 325 mg)  9:00 PM Motrin 600 mg (3 pills of 200 mg)  Continue alternating every 3 hours   We recommend that you follow this schedule around-the-clock for at least 3 days after surgery, or until you feel that it is no longer needed. Use the table on the last page of this handout to keep track of the medications you are taking. Important: Do not take more than 3000mg  of Tylenol or 1800mg  of Motrin in a 24-hour period. Do not take ibuprofen/Motrin if you have a history of bleeding stomach ulcers,  severe kidney disease, &/or actively taking a blood thinner  What if I still have pain? If you have pain that is not controlled with the over-the-counter pain medications (Tylenol and Motrin or Advil) you might have what we call "breakthrough" pain. You will receive a prescription for a small amount of an opioid pain medication such as Oxycodone, Tramadol, or Tylenol with Codeine. Use these opioid pills in the first 24 hours after surgery if you have breakthrough pain. Do not take more than 1 pill every 4-6 hours.  If you still have uncontrolled pain after using all opioid pills, don't hesitate to call our staff using the number provided. We will help make sure you are managing your pain in the best way possible, and if necessary, we can provide a prescription for additional pain medication.   Day 1    Time  Name of Medication Number of pills taken  Amount of Acetaminophen  Pain Level   Comments  AM PM       AM PM       AM PM       AM PM       AM PM       AM PM       AM PM       AM PM       Total Daily amount of Acetaminophen Do not take more than  3,000 mg per day      Day 2    Time  Name of Medication Number of pills taken  Amount of Acetaminophen  Pain Level   Comments  AM PM       AM PM       AM PM       AM PM       AM PM       AM PM       AM PM       AM PM       Total  Daily amount of Acetaminophen Do not take more than  3,000 mg per day      Day 3    Time  Name of Medication Number of pills taken  Amount of Acetaminophen  Pain Level   Comments  AM PM       AM PM       AM PM       AM PM          AM PM       AM PM       AM PM       AM PM       Total Daily amount of Acetaminophen Do not take more than  3,000 mg per day      Day 4    Time  Name of Medication Number of pills taken  Amount of Acetaminophen  Pain Level   Comments  AM PM       AM PM       AM PM       AM PM       AM PM       AM PM       AM PM       AM PM       Total Daily amount of Acetaminophen Do not take more than  3,000 mg per day      Day 5    Time  Name of Medication Number of pills taken  Amount of Acetaminophen  Pain Level   Comments  AM PM       AM PM       AM PM       AM PM       AM PM       AM PM       AM PM       AM PM       Total Daily amount of Acetaminophen Do not take more than  3,000 mg per day       Day 6    Time  Name of Medication Number of pills taken  Amount of Acetaminophen  Pain Level  Comments  AM PM       AM PM       AM PM       AM PM       AM PM       AM PM       AM PM       AM PM       Total Daily amount of Acetaminophen Do not take more than  3,000 mg per day      Day 7    Time  Name of Medication Number of pills taken  Amount of Acetaminophen  Pain Level   Comments  AM PM       AM PM       AM PM       AM PM       AM PM       AM PM       AM PM       AM PM       Total Daily amount of Acetaminophen Do not take more than  3,000 mg per day        For additional information about how and where to safely dispose of unused opioid medications - [employer]  Disclaimer: This document contains information and/or instructional materials adapted from [place] for the typical patient with your condition. It does not replace medical advice from your health care  provider because your experience may differ from that of the typical patient. Talk to your health care provider if you have any questions about this document, your condition or your treatment plan. Adapted from [place]

## 2020-06-26 NOTE — Anesthesia Postprocedure Evaluation (Signed)
Anesthesia Post Note  Patient: Stanley Romero  Procedure(s) Performed: LAPAROSCOPIC CHOLECYSTECTOMY (N/A )     Patient location during evaluation: PACU Anesthesia Type: General Level of consciousness: awake and alert, oriented and patient cooperative Pain management: pain level controlled Vital Signs Assessment: post-procedure vital signs reviewed and stable Respiratory status: spontaneous breathing, nonlabored ventilation and respiratory function stable Cardiovascular status: blood pressure returned to baseline and stable Postop Assessment: no apparent nausea or vomiting Anesthetic complications: no   No complications documented.  Last Vitals:  Vitals:   06/26/20 1203 06/26/20 1215  BP: 121/70 119/76  Pulse: 61 67  Resp: 18 20  Temp: (!) 36.2 C   SpO2: 97% 96%    Last Pain:  Vitals:   06/26/20 1203  TempSrc: Oral  PainSc: [name]

## 2020-06-26 NOTE — Anesthesia Preprocedure Evaluation (Addendum)
Anesthesia Evaluation  Patient identified by MRN, date of birth, ID band Patient awake    Reviewed: Allergy & Precautions, NPO status , Patient's Chart, lab work & pertinent test results  Airway Mallampati: III  TM Distance: >3 FB Neck ROM: Full    Dental  (+) Teeth Intact, Dental Advisory Given, Chipped,    Pulmonary neg pulmonary ROS,    Pulmonary exam normal breath sounds clear to auscultation       Cardiovascular hypertension, Pt. 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)  alcohol use, Gall bladder polyp RUQ   Endo/Other  negative endocrine ROS  Renal/GU negative Renal ROS  negative genitourinary   Musculoskeletal negative musculoskeletal ROS (+)   Abdominal   Peds  Hematology negative hematology ROS (+) hct 45.9   Anesthesia Other Findings   Reproductive/Obstetrics negative OB ROS                           Anesthesia Physical Anesthesia Plan  ASA: II  Anesthesia Plan: General   Post-op Pain Management:    Induction: Intravenous  PONV Risk Score and Plan: 3 and Ondansetron, Dexamethasone, Midazolam and Treatment may vary due to age or medical condition  Airway Management Planned: Oral ETT  Additional Equipment: None  Intra-op Plan:   Post-operative Plan: Extubation in OR  Informed Consent: I have reviewed the patients History and Physical, chart, labs and discussed the procedure including the risks, benefits and alternatives for the proposed anesthesia with the patient or authorized representative who has indicated his/her understanding and acceptance.     Dental advisory given  Plan Discussed with: CRNA  Anesthesia Plan Comments:        Anesthesia Quick Evaluation

## 2020-06-26 NOTE — H&P (Signed)
CC: here for surgery  Requesting provider: Dr Alyson Ingles  HPI: Stanley Romero is an 75 y.o. male who is here for lap cholecystectomy with poss ioc. Denies any medical changes since seen in clinic back in [place]. No trips to ed. No f/c/n/v/cp/sob/doe.   History- The patient is a 75 year old male who presents with abdominal pain. He is referred by Dr Alyson Ingles for evaluation of upper abdominal pain and a gallbladder polyp. He reports 2 episodes where he had developed severe epigastric right upper quadrant pain along with nausea and vomiting. He had fever of 103 that lasted a few days and uncontrolled chills for a few hours. He has some over-the-counter leg cramp medicine but she took which helped resolve his pain. The first episode was the most severe it was followed by a second episode about 3-4 days later. He saw his primary care team in November when this occurred. They check blood work. His amylase and lipase are normal. CBC was normal. His T bili was elevated at 1.5, AST 49, ALT 13 and alkaline phosphatase is elevated at 369. This prompted an ultrasound which demonstrated an 8 mm gallbladder polyp otherwise unremarkable. He had a remote history of elevated alkaline phosphatase levels when seen at the [facility] which I was able to review one was 231 and the other one was 138. 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 No ductal dilatation. They have been stable in size and he is on a 2 year recall imaging. He changed his diet since those episodes that he had in November. He may have some occasional nausea.  He had remote history of diverticulitis surgery in 2001. He was put on antibiotics for 1 month prior to undergoing surgical resection. He did not have an ostomy.  He denies any chest pain, chest pressure, TIAs or amaurosis fugax.  Past Medical History:  Diagnosis Date  . Hepatitis 1973  . High cholesterol   . History of kidney stones 2010  .  Hypertension     Past Surgical History:  Procedure Laterality Date  . APPENDECTOMY  1956  . COLON SURGERY  2001  . COLONOSCOPY  2010  . FRACTURE SURGERY Right 1959  . ROTATOR CUFF REPAIR Left 2008    History reviewed. No pertinent family history.  Social:  reports that he has never smoked. He has never used smokeless tobacco. He reports current alcohol use of about 10.0 standard drinks of alcohol per week. He reports that he does not use drugs.  Allergies:  Allergies  Allergen Reactions  . Bee Venom Anaphylaxis    Medications: I have reviewed the patient's current medications.   ROS - all of the below systems have been reviewed with the patient and positives are indicated with bold text General: chills, fever or night sweats Eyes: blurry vision or double vision ENT: epistaxis or sore throat Allergy/Immunology: itchy/watery eyes or nasal congestion Hematologic/Lymphatic: bleeding problems, blood clots or swollen lymph nodes Endocrine: temperature intolerance or unexpected weight changes Breast: new or changing breast lumps or nipple discharge Resp: cough, shortness of breath, or wheezing CV: chest pain or dyspnea on exertion GI: as per HPI GU: dysuria, trouble voiding, or hematuria MSK: joint pain or joint stiffness Neuro: TIA or stroke symptoms Derm: pruritus and skin lesion changes Psych: anxiety and depression  PE Blood pressure 125/74, pulse 79, temperature 98.1 F (36.7 C), temperature source Oral, resp. rate 16, SpO2 98 %.  Constitutional: NAD; conversant; no deformities Eyes: Moist conjunctiva; no lid lag; anicteric; PERRL Neck: Trachea midline; no thyromegaly Lungs: Normal respiratory effort; no tactile fremitus CV: RRR; no palpable thrills; no pitting edema GI: Abd soft, nt, nd, old low midline incision; no palpable hepatosplenomegaly MSK: Normal gait; no clubbing/cyanosis Psychiatric: Appropriate affect; alert and oriented x3 Lymphatic: No palpable  cervical or axillary lymphadenopathy Skin:no rash/edema/lesions  No results found for this or any previous visit (from the past 80 hour(s)).  No results found.  Imaging: reviewed  A/P: Stanley Romero is an 75 y.o. male with h/o upper abdominal pain, gallbladder polyp  To OR for lap chole with poss ioc Eras All questions asked and answered  Leighton Ruff. [name], MD, FACS General, Bariatric, & Minimally Invasive Surgery [facility], [place]

## 2020-06-26 NOTE — Transfer of Care (Signed)
Immediate Anesthesia Transfer of Care Note  Patient: Thermon Zulauf Insco  Procedure(s) Performed: LAPAROSCOPIC CHOLECYSTECTOMY (N/A )  Patient Location: PACU  Anesthesia Type:General  Level of Consciousness: drowsy and patient cooperative  Airway & Oxygen Therapy: Patient Spontanous Breathing and Patient connected to face mask oxygen  Post-op Assessment: Report given to RN and Post -op Vital signs reviewed and stable  Post vital signs: Reviewed and stable  Last Vitals:  Vitals Value Taken Time  BP    Temp    Pulse 64 06/26/20 1121  Resp 15 06/26/20 1121  SpO2 100 % 06/26/20 1121  Vitals shown include unvalidated device data.  Last Pain:  Vitals:   06/26/20 0754  TempSrc: Oral         Complications: No complications documented.

## 2020-06-27 ENCOUNTER — Encounter (HOSPITAL_COMMUNITY): Payer: Self-pay | Admitting: General Surgery

## 2020-06-27 LAB — SURGICAL PATHOLOGY

## 2020-07-16 DIAGNOSIS — Z6829 Body mass index (BMI) 29.0-29.9, adult: Secondary | ICD-10-CM | POA: Diagnosis not present

## 2020-07-16 DIAGNOSIS — H9190 Unspecified hearing loss, unspecified ear: Secondary | ICD-10-CM | POA: Diagnosis not present

## 2020-07-16 DIAGNOSIS — R7309 Other abnormal glucose: Secondary | ICD-10-CM | POA: Diagnosis not present

## 2020-07-16 DIAGNOSIS — I10 Essential (primary) hypertension: Secondary | ICD-10-CM | POA: Diagnosis not present

## 2020-07-16 DIAGNOSIS — E7800 Pure hypercholesterolemia, unspecified: Secondary | ICD-10-CM | POA: Diagnosis not present

## 2020-07-16 DIAGNOSIS — Z0000 Encounter for general adult medical examination without abnormal findings: Secondary | ICD-10-CM | POA: Diagnosis not present

## 2020-07-16 DIAGNOSIS — G4700 Insomnia, unspecified: Secondary | ICD-10-CM | POA: Diagnosis not present

## 2020-07-16 DIAGNOSIS — N3281 Overactive bladder: Secondary | ICD-10-CM | POA: Diagnosis not present

## 2020-07-16 DIAGNOSIS — N400 Enlarged prostate without lower urinary tract symptoms: Secondary | ICD-10-CM | POA: Diagnosis not present

## 2020-07-16 DIAGNOSIS — Z1389 Encounter for screening for other disorder: Secondary | ICD-10-CM | POA: Diagnosis not present

## 2020-09-21 DIAGNOSIS — Z23 Encounter for immunization: Secondary | ICD-10-CM | POA: Diagnosis not present

## 2020-11-29 ENCOUNTER — Encounter (HOSPITAL_COMMUNITY): Payer: Self-pay | Admitting: *Deleted

## 2020-11-29 ENCOUNTER — Observation Stay (HOSPITAL_COMMUNITY)
Admission: EM | Admit: 2020-11-29 | Discharge: 2020-11-30 | Disposition: A | Discharge status: Home / Self Care | Payer: Medicare Other | Attending: Internal Medicine | Admitting: Internal Medicine

## 2020-11-29 ENCOUNTER — Other Ambulatory Visit: Payer: Self-pay

## 2020-11-29 ENCOUNTER — Emergency Department (HOSPITAL_COMMUNITY): Payer: Medicare Other

## 2020-11-29 DIAGNOSIS — R109 Unspecified abdominal pain: Secondary | ICD-10-CM | POA: Diagnosis not present

## 2020-11-29 DIAGNOSIS — R079 Chest pain, unspecified: Secondary | ICD-10-CM | POA: Insufficient documentation

## 2020-11-29 DIAGNOSIS — R7989 Other specified abnormal findings of blood chemistry: Secondary | ICD-10-CM

## 2020-11-29 DIAGNOSIS — R778 Other specified abnormalities of plasma proteins: Secondary | ICD-10-CM | POA: Insufficient documentation

## 2020-11-29 DIAGNOSIS — I499 Cardiac arrhythmia, unspecified: Secondary | ICD-10-CM | POA: Diagnosis not present

## 2020-11-29 DIAGNOSIS — Z79899 Other long term (current) drug therapy: Secondary | ICD-10-CM | POA: Diagnosis not present

## 2020-11-29 DIAGNOSIS — E7800 Pure hypercholesterolemia, unspecified: Secondary | ICD-10-CM | POA: Diagnosis present

## 2020-11-29 DIAGNOSIS — R55 Syncope and collapse: Secondary | ICD-10-CM | POA: Insufficient documentation

## 2020-11-29 DIAGNOSIS — D72829 Elevated white blood cell count, unspecified: Secondary | ICD-10-CM | POA: Diagnosis not present

## 2020-11-29 DIAGNOSIS — J90 Pleural effusion, not elsewhere classified: Secondary | ICD-10-CM | POA: Diagnosis not present

## 2020-11-29 DIAGNOSIS — Z20822 Contact with and (suspected) exposure to covid-19: Secondary | ICD-10-CM | POA: Diagnosis not present

## 2020-11-29 DIAGNOSIS — I6529 Occlusion and stenosis of unspecified carotid artery: Secondary | ICD-10-CM | POA: Diagnosis not present

## 2020-11-29 DIAGNOSIS — S0990XA Unspecified injury of head, initial encounter: Secondary | ICD-10-CM | POA: Diagnosis not present

## 2020-11-29 DIAGNOSIS — T63461A Toxic effect of venom of wasps, accidental (unintentional), initial encounter: Principal | ICD-10-CM | POA: Insufficient documentation

## 2020-11-29 DIAGNOSIS — T782XXA Anaphylactic shock, unspecified, initial encounter: Secondary | ICD-10-CM

## 2020-11-29 DIAGNOSIS — I10 Essential (primary) hypertension: Secondary | ICD-10-CM | POA: Insufficient documentation

## 2020-11-29 DIAGNOSIS — S199XXA Unspecified injury of neck, initial encounter: Secondary | ICD-10-CM | POA: Diagnosis not present

## 2020-11-29 DIAGNOSIS — N3289 Other specified disorders of bladder: Secondary | ICD-10-CM | POA: Diagnosis not present

## 2020-11-29 DIAGNOSIS — I700 Atherosclerosis of aorta: Secondary | ICD-10-CM | POA: Diagnosis not present

## 2020-11-29 DIAGNOSIS — J9811 Atelectasis: Secondary | ICD-10-CM | POA: Diagnosis not present

## 2020-11-29 DIAGNOSIS — Z7982 Long term (current) use of aspirin: Secondary | ICD-10-CM | POA: Diagnosis not present

## 2020-11-29 DIAGNOSIS — N400 Enlarged prostate without lower urinary tract symptoms: Secondary | ICD-10-CM | POA: Diagnosis not present

## 2020-11-29 DIAGNOSIS — E876 Hypokalemia: Secondary | ICD-10-CM | POA: Diagnosis not present

## 2020-11-29 DIAGNOSIS — R102 Pelvic and perineal pain: Secondary | ICD-10-CM | POA: Diagnosis not present

## 2020-11-29 DIAGNOSIS — I672 Cerebral atherosclerosis: Secondary | ICD-10-CM | POA: Diagnosis not present

## 2020-11-29 DIAGNOSIS — N179 Acute kidney failure, unspecified: Secondary | ICD-10-CM | POA: Insufficient documentation

## 2020-11-29 DIAGNOSIS — Z041 Encounter for examination and observation following transport accident: Secondary | ICD-10-CM | POA: Diagnosis not present

## 2020-11-29 DIAGNOSIS — Z9889 Other specified postprocedural states: Secondary | ICD-10-CM | POA: Diagnosis not present

## 2020-11-29 HISTORY — DX: Other specified abnormal findings of blood chemistry: R79.89

## 2020-11-29 LAB — CBC
HCT: 47.1 % (ref 39.0–52.0)
Hemoglobin: 15.1 g/dL (ref 13.0–17.0)
MCH: 32.7 pg (ref 26.0–34.0)
MCHC: 32.1 g/dL (ref 30.0–36.0)
MCV: 101.9 fL — ABNORMAL HIGH (ref 80.0–100.0)
Platelets: 147 10*3/uL — ABNORMAL LOW (ref 150–400)
RBC: 4.62 MIL/uL (ref 4.22–5.81)
RDW: 13.2 % (ref 11.5–15.5)
WBC: 18 10*3/uL — ABNORMAL HIGH (ref 4.0–10.5)
nRBC: 0 % (ref 0.0–0.2)

## 2020-11-29 LAB — BASIC METABOLIC PANEL
Anion gap: 13 (ref 5–15)
BUN: 22 mg/dL (ref 8–23)
CO2: 18 mmol/L — ABNORMAL LOW (ref 22–32)
Calcium: 9 mg/dL (ref 8.9–10.3)
Chloride: 108 mmol/L (ref 98–111)
Creatinine, Ser: 1.36 mg/dL — ABNORMAL HIGH (ref 0.61–1.24)
GFR, Estimated: 54 mL/min — ABNORMAL LOW (ref >60)
Glucose, Bld: 149 mg/dL — ABNORMAL HIGH (ref 70–99)
Potassium: 3.3 mmol/L — ABNORMAL LOW (ref 3.5–5.1)
Sodium: 139 mmol/L (ref 135–145)

## 2020-11-29 LAB — I-STAT CHEM 8, ED
BUN: 23 mg/dL (ref 8–23)
Calcium, Ion: 1.05 mmol/L — ABNORMAL LOW (ref 1.15–1.40)
Chloride: 111 mmol/L (ref 98–111)
Creatinine, Ser: 1.1 mg/dL (ref 0.61–1.24)
Glucose, Bld: 151 mg/dL — ABNORMAL HIGH (ref 70–99)
HCT: 46 % (ref 39.0–52.0)
Hemoglobin: 15.6 g/dL (ref 13.0–17.0)
Potassium: 3.3 mmol/L — ABNORMAL LOW (ref 3.5–5.1)
Sodium: 139 mmol/L (ref 135–145)
TCO2: 19 mmol/L — ABNORMAL LOW (ref 22–32)

## 2020-11-29 LAB — TROPONIN I (HIGH SENSITIVITY)
Troponin I (High Sensitivity): 27 ng/L — ABNORMAL HIGH (ref <18)
Troponin I (High Sensitivity): 92 ng/L — ABNORMAL HIGH (ref <18)
Troponin I (High Sensitivity): 70 ng/L — ABNORMAL HIGH (ref <18)

## 2020-11-29 LAB — RESP PANEL BY RT-PCR (FLU A&B, COVID) ARPGX2
Influenza A by PCR: NEGATIVE
Influenza B by PCR: NEGATIVE
SARS Coronavirus 2 by RT PCR: NEGATIVE

## 2020-11-29 LAB — CBG MONITORING, ED: Glucose-Capillary: 154 mg/dL — ABNORMAL HIGH (ref 70–99)

## 2020-11-29 LAB — MAGNESIUM: Magnesium: 2.1 mg/dL (ref 1.7–2.4)

## 2020-11-29 LAB — SAMPLE TO BLOOD BANK

## 2020-11-29 MED ORDER — ASPIRIN 81 MG PO CHEW
324.0000 mg | CHEWABLE_TABLET | Freq: Once | ORAL | Status: AC
  Administered 2020-11-29: 324 mg via ORAL
  Filled 2020-11-29: qty 4

## 2020-11-29 MED ORDER — POTASSIUM CHLORIDE CRYS ER 20 MEQ PO TBCR
20.0000 meq | EXTENDED_RELEASE_TABLET | Freq: Once | ORAL | Status: AC
  Administered 2020-11-29: 20 meq via ORAL
  Filled 2020-11-29: qty 1

## 2020-11-29 MED ORDER — ZINC SULFATE 220 (50 ZN) MG PO CAPS: 220.0000 mg | ORAL_CAPSULE | Freq: Every day | ORAL | Status: DC

## 2020-11-29 MED ORDER — VITAMIN D 25 MCG (1000 UNIT) PO TABS: 1000.0000 [IU] | ORAL_TABLET | Freq: Every day | ORAL | Status: DC

## 2020-11-29 MED ORDER — B COMPLEX-C PO TABS
1.0000 | ORAL_TABLET | Freq: Every day | ORAL | Status: DC
  Filled 2020-11-29: qty 1

## 2020-11-29 MED ORDER — ZOLPIDEM TARTRATE 5 MG PO TABS: 10.0000 mg | ORAL_TABLET | Freq: Every evening | ORAL | Status: DC | PRN

## 2020-11-29 MED ORDER — ROSUVASTATIN CALCIUM 20 MG PO TABS: 20.0000 mg | ORAL_TABLET | Freq: Every day | ORAL | Status: DC

## 2020-11-29 MED ORDER — SODIUM CHLORIDE 0.9 % IV SOLN: INTRAVENOUS | Status: AC

## 2020-11-29 MED ORDER — VITAMIN E 45 MG (100 UNIT) PO CAPS
400.0000 [IU] | ORAL_CAPSULE | Freq: Every day | ORAL | Status: DC
  Filled 2020-11-29: qty 4

## 2020-11-29 MED ORDER — IOHEXOL 300 MG/ML  SOLN
100.0000 mL | Freq: Once | INTRAMUSCULAR | Status: AC | PRN
  Administered 2020-11-29: 100 mL via INTRAVENOUS

## 2020-11-29 MED ORDER — FESOTERODINE FUMARATE ER 4 MG PO TB24
4.0000 mg | ORAL_TABLET | Freq: Every day | ORAL | Status: DC
  Filled 2020-11-29: qty 1

## 2020-11-29 MED ORDER — SODIUM CHLORIDE 0.9 % IV SOLN: INTRAVENOUS | Status: DC

## 2020-11-29 MED ORDER — OXYCODONE HCL 5 MG PO TABS: 5.0000 mg | ORAL_TABLET | Freq: Four times a day (QID) | ORAL | Status: DC | PRN

## 2020-11-29 MED ORDER — ACETAMINOPHEN 650 MG RE SUPP: 650.0000 mg | Freq: Four times a day (QID) | RECTAL | Status: DC | PRN

## 2020-11-29 MED ORDER — ENOXAPARIN SODIUM 40 MG/0.4ML IJ SOSY
40.0000 mg | PREFILLED_SYRINGE | INTRAMUSCULAR | Status: DC
  Administered 2020-11-29: 40 mg via SUBCUTANEOUS
  Filled 2020-11-29: qty 0.4

## 2020-11-29 MED ORDER — ASPIRIN EC 81 MG PO TBEC: 81.0000 mg | DELAYED_RELEASE_TABLET | Freq: Every day | ORAL | Status: DC

## 2020-11-29 MED ORDER — ACETAMINOPHEN 325 MG PO TABS: 650.0000 mg | ORAL_TABLET | Freq: Four times a day (QID) | ORAL | Status: DC | PRN

## 2020-11-29 MED ORDER — IRBESARTAN 300 MG PO TABS
300.0000 mg | ORAL_TABLET | Freq: Every day | ORAL | Status: DC
  Filled 2020-11-29: qty 1

## 2020-11-29 MED ORDER — SODIUM CHLORIDE 0.9 % IV BOLUS
500.0000 mL | Freq: Once | INTRAVENOUS | Status: AC
  Administered 2020-11-29: 500 mL via INTRAVENOUS

## 2020-11-29 MED ORDER — ASCORBIC ACID 500 MG PO TABS: 500.0000 mg | ORAL_TABLET | Freq: Every day | ORAL | Status: DC
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
## 2020-11-29 NOTE — ED Notes (Signed)
The pt reports no pain  ivs sites re-taped

## 2020-11-29 NOTE — ED Notes (Signed)
[employer] [phone]
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
## 2020-11-30 ENCOUNTER — Other Ambulatory Visit (HOSPITAL_COMMUNITY): Payer: Medicare Other

## 2020-11-30 DIAGNOSIS — R55 Syncope and collapse: Secondary | ICD-10-CM | POA: Diagnosis not present

## 2020-11-30 LAB — CBC
HCT: 42.4 % (ref 39.0–52.0)
Hemoglobin: 13.9 g/dL (ref 13.0–17.0)
MCH: 32.6 pg (ref 26.0–34.0)
MCHC: 32.8 g/dL (ref 30.0–36.0)
MCV: 99.3 fL (ref 80.0–100.0)
Platelets: 206 10*3/uL (ref 150–400)
RBC: 4.27 MIL/uL (ref 4.22–5.81)
RDW: 13.5 % (ref 11.5–15.5)
WBC: 21.9 10*3/uL — ABNORMAL HIGH (ref 4.0–10.5)
nRBC: 0 % (ref 0.0–0.2)

## 2020-11-30 LAB — BASIC METABOLIC PANEL
Anion gap: 6 (ref 5–15)
BUN: 18 mg/dL (ref 8–23)
CO2: 21 mmol/L — ABNORMAL LOW (ref 22–32)
Calcium: 8.7 mg/dL — ABNORMAL LOW (ref 8.9–10.3)
Chloride: 109 mmol/L (ref 98–111)
Creatinine, Ser: 1 mg/dL (ref 0.61–1.24)
GFR, Estimated: >60 mL/min (ref >60)
Glucose, Bld: 169 mg/dL — ABNORMAL HIGH (ref 70–99)
Potassium: 4.5 mmol/L (ref 3.5–5.1)
Sodium: 136 mmol/L (ref 135–145)

## 2020-11-30 NOTE — Discharge Summary (Signed)
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 COVID-19 declaration under Section 564(b)(1) of the Act, 21 U.S.C.section 360bbb-3(b)(1), unless the authorization is terminated  or revoked sooner.       Influenza A by PCR NEGATIVE NEGATIVE Final   Influenza B by PCR NEGATIVE NEGATIVE Final    Comment: (NOTE) The Xpert Xpress SARS-CoV-2/FLU/RSV plus assay is intended as an aid in the diagnosis of influenza from Nasopharyngeal swab specimens and should not be used as a sole basis for treatment. Nasal washings and aspirates are unacceptable for Xpert Xpress SARS-CoV-2/FLU/RSV testing.  Fact Sheet for Patients: EntrepreneurPulse.com.au  Fact Sheet for Healthcare Providers: IncredibleEmployment.be  This test is not yet approved or cleared by the [place] FDA and has been authorized for detection and/or diagnosis of SARS-CoV-2  by FDA under an Emergency Use Authorization (EUA). This EUA will remain in effect (meaning this test can be used) for the duration of the COVID-19 declaration under Section 564(b)(1) of the Act, 21 U.S.C. section 360bbb-3(b)(1), unless the authorization is terminated or revoked.  Performed at [facility], [place] [address] [place], [place] [address]      Time coordinating discharge: Over 30 minutes  SIGNED:   Myalee Stengel J [place] DO  [facility] 11/30/2020, 8:26 AM

## 2020-12-12 DIAGNOSIS — Z87442 Personal history of urinary calculi: Secondary | ICD-10-CM | POA: Diagnosis not present

## 2020-12-12 DIAGNOSIS — Z91030 Bee allergy status: Secondary | ICD-10-CM | POA: Diagnosis not present

## 2020-12-16 DIAGNOSIS — K862 Cyst of pancreas: Secondary | ICD-10-CM | POA: Diagnosis not present

## 2020-12-16 DIAGNOSIS — Z87442 Personal history of urinary calculi: Secondary | ICD-10-CM | POA: Diagnosis not present

## 2020-12-16 DIAGNOSIS — Z91030 Bee allergy status: Secondary | ICD-10-CM | POA: Diagnosis not present

## 2020-12-16 DIAGNOSIS — Z87828 Personal history of other (healed) physical injury and trauma: Secondary | ICD-10-CM | POA: Diagnosis not present

## 2020-12-16 DIAGNOSIS — R17 Unspecified jaundice: Secondary | ICD-10-CM | POA: Diagnosis not present

## 2020-12-17 ENCOUNTER — Other Ambulatory Visit: Payer: Self-pay | Admitting: Family Medicine

## 2020-12-17 DIAGNOSIS — K862 Cyst of pancreas: Secondary | ICD-10-CM

## 2020-12-19 DIAGNOSIS — R17 Unspecified jaundice: Secondary | ICD-10-CM | POA: Diagnosis not present

## 2020-12-19 DIAGNOSIS — L509 Urticaria, unspecified: Secondary | ICD-10-CM | POA: Diagnosis not present

## 2020-12-26 DIAGNOSIS — R17 Unspecified jaundice: Secondary | ICD-10-CM | POA: Diagnosis not present

## 2020-12-30 DIAGNOSIS — R634 Abnormal weight loss: Secondary | ICD-10-CM | POA: Diagnosis not present

## 2020-12-30 DIAGNOSIS — R7989 Other specified abnormal findings of blood chemistry: Secondary | ICD-10-CM | POA: Diagnosis not present

## 2020-12-30 DIAGNOSIS — R945 Abnormal results of liver function studies: Secondary | ICD-10-CM | POA: Diagnosis not present

## 2020-12-30 DIAGNOSIS — R1013 Epigastric pain: Secondary | ICD-10-CM | POA: Diagnosis not present

## 2021-01-02 DIAGNOSIS — H16223 Keratoconjunctivitis sicca, not specified as Sjogren's, bilateral: Secondary | ICD-10-CM | POA: Diagnosis not present

## 2021-01-02 DIAGNOSIS — H35033 Hypertensive retinopathy, bilateral: Secondary | ICD-10-CM | POA: Diagnosis not present

## 2021-01-02 DIAGNOSIS — H26493 Other secondary cataract, bilateral: Secondary | ICD-10-CM | POA: Diagnosis not present

## 2021-01-02 DIAGNOSIS — H35373 Puckering of macula, bilateral: Secondary | ICD-10-CM | POA: Diagnosis not present

## 2021-01-02 DIAGNOSIS — H401132 Primary open-angle glaucoma, bilateral, moderate stage: Secondary | ICD-10-CM | POA: Diagnosis not present

## 2021-01-04 ENCOUNTER — Other Ambulatory Visit: Payer: Self-pay

## 2021-01-04 ENCOUNTER — Ambulatory Visit
Admission: RE | Admit: 2021-01-04 | Discharge: 2021-01-04 | Disposition: A | Discharge status: Home / Self Care | Payer: Medicare Other | Source: Ambulatory Visit | Attending: Family Medicine | Admitting: Family Medicine

## 2021-01-04 DIAGNOSIS — N281 Cyst of kidney, acquired: Secondary | ICD-10-CM | POA: Diagnosis not present

## 2021-01-04 DIAGNOSIS — Z9049 Acquired absence of other specified parts of digestive tract: Secondary | ICD-10-CM | POA: Diagnosis not present

## 2021-01-04 DIAGNOSIS — K8050 Calculus of bile duct without cholangitis or cholecystitis without obstruction: Secondary | ICD-10-CM | POA: Diagnosis not present

## 2021-01-04 DIAGNOSIS — K862 Cyst of pancreas: Secondary | ICD-10-CM | POA: Diagnosis not present

## 2021-01-04 MED ORDER — GADOBENATE DIMEGLUMINE 529 MG/ML IV SOLN
15.0000 mL | Freq: Once | INTRAVENOUS | Status: AC | PRN
  Administered 2021-01-04: 15 mL via INTRAVENOUS

## 2021-01-22 ENCOUNTER — Ambulatory Visit (INDEPENDENT_AMBULATORY_CARE_PROVIDER_SITE_OTHER): Payer: Medicare Other | Admitting: Allergy

## 2021-01-22 ENCOUNTER — Other Ambulatory Visit: Payer: Self-pay

## 2021-01-22 ENCOUNTER — Encounter: Payer: Self-pay | Admitting: Allergy

## 2021-01-22 VITALS — BP 122/78 | HR 69 | Temp 97.7°F | Resp 18 | Ht 66.89 in | Wt 170.8 lb

## 2021-01-22 DIAGNOSIS — T782XXD Anaphylactic shock, unspecified, subsequent encounter: Secondary | ICD-10-CM | POA: Diagnosis not present

## 2021-01-22 DIAGNOSIS — T63481A Toxic effect of venom of other arthropod, accidental (unintentional), initial encounter: Secondary | ICD-10-CM | POA: Insufficient documentation

## 2021-01-22 DIAGNOSIS — T63481D Toxic effect of venom of other arthropod, accidental (unintentional), subsequent encounter: Secondary | ICD-10-CM

## 2021-01-22 MED ORDER — EPINEPHRINE 0.3 MG/0.3ML IJ SOAJ: 0.3000 mg | INTRAMUSCULAR | 2 refills | Status: DC | PRN
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
## 2021-01-22 NOTE — Patient Instructions (Addendum)
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 Get bloodwork. If positive then will start allergy shots for the bees. If negative then will need to do skin testing a the [place] office next - this is done once per month.  We are ordering labs, so please allow 1-2 weeks for the results to come back. With the newly implemented Cures Act, the labs might be visible to you at the same time that they become visible to me. However, I will not address the results until all of the results are back, so please be patient.  In the meantime, continue recommendations in your patient instructions, including avoidance measures (if applicable), until you hear from me.  Make sure you have both pens with you at the same time. If you have to use the Epipen - then please call 911 or go to the ER.  I have prescribed epinephrine injectable device and demonstrated proper use. For mild symptoms you can take over the counter antihistamines such as Benadryl and monitor symptoms closely. If symptoms worsen or if you have severe symptoms including breathing issues, throat closure, significant swelling, whole body hives, severe diarrhea and vomiting, lightheadedness then inject epinephrine and seek immediate medical care afterwards. Emergency action plan given.  Follow up in 1 year.
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
## 2021-01-27 DIAGNOSIS — R748 Abnormal levels of other serum enzymes: Secondary | ICD-10-CM | POA: Diagnosis not present

## 2021-01-27 DIAGNOSIS — N281 Cyst of kidney, acquired: Secondary | ICD-10-CM | POA: Diagnosis not present

## 2021-01-27 DIAGNOSIS — Z87442 Personal history of urinary calculi: Secondary | ICD-10-CM | POA: Diagnosis not present

## 2021-01-27 DIAGNOSIS — R3915 Urgency of urination: Secondary | ICD-10-CM | POA: Diagnosis not present

## 2021-01-27 DIAGNOSIS — T782XXD Anaphylactic shock, unspecified, subsequent encounter: Secondary | ICD-10-CM | POA: Diagnosis not present

## 2021-01-27 DIAGNOSIS — N401 Enlarged prostate with lower urinary tract symptoms: Secondary | ICD-10-CM | POA: Diagnosis not present

## 2021-01-27 DIAGNOSIS — T63481D Toxic effect of venom of other arthropod, accidental (unintentional), subsequent encounter: Secondary | ICD-10-CM | POA: Diagnosis not present

## 2021-02-03 LAB — ALLERGEN HYMENOPTERA PANEL
Bumblebee: <0.1 kU/L
Honeybee IgE: <0.1 kU/L
Hornet, White Face, IgE: 0.9 kU/L — AB
Hornet, Yellow, IgE: 0.15 kU/L — AB
Paper Wasp IgE: 1.26 kU/L — AB
Yellow Jacket, IgE: 1.98 kU/L — AB

## 2021-02-03 LAB — ALLERGEN FIRE ANT: I070-IgE Fire Ant (Invicta): <0.1 kU/L

## 2021-02-03 LAB — TRYPTASE: Tryptase: 42.3 ug/L — ABNORMAL HIGH (ref 2.2–13.2)

## 2021-02-04 ENCOUNTER — Other Ambulatory Visit: Payer: Self-pay | Admitting: Allergy

## 2021-02-04 ENCOUNTER — Telehealth: Payer: Self-pay

## 2021-02-04 DIAGNOSIS — J309 Allergic rhinitis, unspecified: Secondary | ICD-10-CM

## 2021-02-04 DIAGNOSIS — T782XXD Anaphylactic shock, unspecified, subsequent encounter: Secondary | ICD-10-CM

## 2021-02-04 DIAGNOSIS — T63481D Toxic effect of venom of other arthropod, accidental (unintentional), subsequent encounter: Secondary | ICD-10-CM

## 2021-02-13 ENCOUNTER — Other Ambulatory Visit: Payer: Self-pay | Admitting: Gastroenterology

## 2021-02-13 DIAGNOSIS — E7800 Pure hypercholesterolemia, unspecified: Secondary | ICD-10-CM | POA: Diagnosis not present

## 2021-02-19 ENCOUNTER — Encounter (HOSPITAL_COMMUNITY): Payer: Self-pay | Admitting: Gastroenterology

## 2021-02-20 ENCOUNTER — Ambulatory Visit (INDEPENDENT_AMBULATORY_CARE_PROVIDER_SITE_OTHER): Payer: Medicare Other

## 2021-02-20 ENCOUNTER — Other Ambulatory Visit: Payer: Self-pay

## 2021-02-20 DIAGNOSIS — T63481D Toxic effect of venom of other arthropod, accidental (unintentional), subsequent encounter: Secondary | ICD-10-CM | POA: Diagnosis not present

## 2021-02-20 DIAGNOSIS — T782XXD Anaphylactic shock, unspecified, subsequent encounter: Secondary | ICD-10-CM | POA: Diagnosis not present

## 2021-02-20 NOTE — Progress Notes (Signed)
Patient cam in today and started his venom injections. Patient was already given an Epipen and he informed he that he was well aware of proper use for it. Patient denies having a continual antihistamine regimen  and expressed that during any type of flare he uses benadryl. I did review his last office visit note and I didn't see where he was instructed to have a daily antihistamine regimen. Patient signed consent today and was informed of his schedule. Patient was given mixed vespid as well as wasp today. Patient waited in the injection room lobby for thirty minutes with out any issue. Patient denied having any questions or concerns during his appointment. Patient was scheduled for his next appointment.

## 2021-02-20 NOTE — Telephone Encounter (Signed)
Labs needed

## 2021-02-26 ENCOUNTER — Other Ambulatory Visit: Payer: Self-pay

## 2021-02-26 ENCOUNTER — Ambulatory Visit (INDEPENDENT_AMBULATORY_CARE_PROVIDER_SITE_OTHER): Payer: Medicare Other | Admitting: *Deleted

## 2021-02-26 DIAGNOSIS — T782XXD Anaphylactic shock, unspecified, subsequent encounter: Secondary | ICD-10-CM

## 2021-02-26 DIAGNOSIS — T63481D Toxic effect of venom of other arthropod, accidental (unintentional), subsequent encounter: Secondary | ICD-10-CM

## 2021-03-05 ENCOUNTER — Ambulatory Visit (INDEPENDENT_AMBULATORY_CARE_PROVIDER_SITE_OTHER): Payer: Medicare Other

## 2021-03-05 ENCOUNTER — Observation Stay (HOSPITAL_COMMUNITY)
Admission: EM | Admit: 2021-03-05 | Discharge: 2021-03-06 | Disposition: A | Discharge status: Home / Self Care | Payer: Medicare Other | Attending: Emergency Medicine | Admitting: Emergency Medicine

## 2021-03-05 ENCOUNTER — Other Ambulatory Visit: Payer: Self-pay

## 2021-03-05 ENCOUNTER — Encounter (HOSPITAL_COMMUNITY): Payer: Self-pay | Admitting: Emergency Medicine

## 2021-03-05 DIAGNOSIS — Z20822 Contact with and (suspected) exposure to covid-19: Secondary | ICD-10-CM | POA: Diagnosis not present

## 2021-03-05 DIAGNOSIS — K5792 Diverticulitis of intestine, part unspecified, without perforation or abscess without bleeding: Secondary | ICD-10-CM

## 2021-03-05 DIAGNOSIS — Z79899 Other long term (current) drug therapy: Secondary | ICD-10-CM | POA: Diagnosis not present

## 2021-03-05 DIAGNOSIS — K710 Toxic liver disease with cholestasis: Secondary | ICD-10-CM | POA: Diagnosis not present

## 2021-03-05 DIAGNOSIS — Z9049 Acquired absence of other specified parts of digestive tract: Secondary | ICD-10-CM | POA: Diagnosis not present

## 2021-03-05 DIAGNOSIS — R109 Unspecified abdominal pain: Secondary | ICD-10-CM | POA: Diagnosis not present

## 2021-03-05 DIAGNOSIS — R17 Unspecified jaundice: Secondary | ICD-10-CM | POA: Diagnosis not present

## 2021-03-05 DIAGNOSIS — Z7982 Long term (current) use of aspirin: Secondary | ICD-10-CM | POA: Insufficient documentation

## 2021-03-05 DIAGNOSIS — T782XXD Anaphylactic shock, unspecified, subsequent encounter: Secondary | ICD-10-CM | POA: Diagnosis not present

## 2021-03-05 DIAGNOSIS — K838 Other specified diseases of biliary tract: Secondary | ICD-10-CM | POA: Insufficient documentation

## 2021-03-05 DIAGNOSIS — K8050 Calculus of bile duct without cholangitis or cholecystitis without obstruction: Secondary | ICD-10-CM | POA: Diagnosis not present

## 2021-03-05 DIAGNOSIS — I10 Essential (primary) hypertension: Secondary | ICD-10-CM | POA: Diagnosis not present

## 2021-03-05 DIAGNOSIS — T63481D Toxic effect of venom of other arthropod, accidental (unintentional), subsequent encounter: Secondary | ICD-10-CM | POA: Diagnosis not present

## 2021-03-05 DIAGNOSIS — R634 Abnormal weight loss: Secondary | ICD-10-CM | POA: Diagnosis not present

## 2021-03-05 DIAGNOSIS — R1011 Right upper quadrant pain: Secondary | ICD-10-CM | POA: Diagnosis present

## 2021-03-05 DIAGNOSIS — E7800 Pure hypercholesterolemia, unspecified: Secondary | ICD-10-CM | POA: Diagnosis not present

## 2021-03-05 DIAGNOSIS — R748 Abnormal levels of other serum enzymes: Secondary | ICD-10-CM | POA: Diagnosis not present

## 2021-03-05 DIAGNOSIS — L298 Other pruritus: Secondary | ICD-10-CM | POA: Diagnosis not present

## 2021-03-05 DIAGNOSIS — R509 Fever, unspecified: Secondary | ICD-10-CM | POA: Diagnosis not present

## 2021-03-05 HISTORY — DX: Diverticulitis of intestine, part unspecified, without perforation or abscess without bleeding: K57.92

## 2021-03-05 LAB — COMPREHENSIVE METABOLIC PANEL
ALT: 101 U/L — ABNORMAL HIGH (ref 0–44)
AST: 81 U/L — ABNORMAL HIGH (ref 15–41)
Albumin: 3.8 g/dL (ref 3.5–5.0)
Alkaline Phosphatase: 554 U/L — ABNORMAL HIGH (ref 38–126)
Anion gap: 8 (ref 5–15)
BUN: 12 mg/dL (ref 8–23)
CO2: 26 mmol/L (ref 22–32)
Calcium: 9.8 mg/dL (ref 8.9–10.3)
Chloride: 101 mmol/L (ref 98–111)
Creatinine, Ser: 0.72 mg/dL (ref 0.61–1.24)
GFR, Estimated: >60 mL/min (ref >60)
Glucose, Bld: 113 mg/dL — ABNORMAL HIGH (ref 70–99)
Potassium: 3.9 mmol/L (ref 3.5–5.1)
Sodium: 135 mmol/L (ref 135–145)
Total Bilirubin: 5.7 mg/dL — ABNORMAL HIGH (ref 0.3–1.2)
Total Protein: 7.4 g/dL (ref 6.5–8.1)

## 2021-03-05 LAB — CBC WITH DIFFERENTIAL/PLATELET
Abs Immature Granulocytes: 0.02 10*3/uL (ref 0.00–0.07)
Basophils Absolute: 0 10*3/uL (ref 0.0–0.1)
Basophils Relative: 1 %
Eosinophils Absolute: 0.1 10*3/uL (ref 0.0–0.5)
Eosinophils Relative: 1 %
HCT: 45.5 % (ref 39.0–52.0)
Hemoglobin: 14.9 g/dL (ref 13.0–17.0)
Immature Granulocytes: 0 %
Lymphocytes Relative: 23 %
Lymphs Abs: 1.3 10*3/uL (ref 0.7–4.0)
MCH: 32.5 pg (ref 26.0–34.0)
MCHC: 32.7 g/dL (ref 30.0–36.0)
MCV: 99.3 fL (ref 80.0–100.0)
Monocytes Absolute: 0.5 10*3/uL (ref 0.1–1.0)
Monocytes Relative: 9 %
Neutro Abs: 3.9 10*3/uL (ref 1.7–7.7)
Neutrophils Relative %: 66 %
Platelets: 204 10*3/uL (ref 150–400)
RBC: 4.58 MIL/uL (ref 4.22–5.81)
RDW: 14.6 % (ref 11.5–15.5)
WBC: 5.8 10*3/uL (ref 4.0–10.5)
nRBC: 0 % (ref 0.0–0.2)

## 2021-03-05 LAB — URINALYSIS, ROUTINE W REFLEX MICROSCOPIC
Bacteria, UA: NONE SEEN
Bilirubin Urine: NEGATIVE
Glucose, UA: NEGATIVE mg/dL
Ketones, ur: NEGATIVE mg/dL
Leukocytes,Ua: NEGATIVE
Nitrite: NEGATIVE
Protein, ur: NEGATIVE mg/dL
Specific Gravity, Urine: 1.011 (ref 1.005–1.030)
pH: 5 (ref 5.0–8.0)

## 2021-03-05 LAB — RESP PANEL BY RT-PCR (FLU A&B, COVID) ARPGX2
Influenza A by PCR: NEGATIVE
Influenza B by PCR: NEGATIVE
SARS Coronavirus 2 by RT PCR: NEGATIVE

## 2021-03-05 LAB — LIPASE, BLOOD: Lipase: 38 U/L (ref 11–51)

## 2021-03-05 MED ORDER — HYDRALAZINE HCL 20 MG/ML IJ SOLN: 10.0000 mg | Freq: Four times a day (QID) | INTRAMUSCULAR | Status: DC | PRN

## 2021-03-05 MED ORDER — SODIUM CHLORIDE 0.9 % IV SOLN: INTRAVENOUS | Status: DC

## 2021-03-05 MED ORDER — ZOLPIDEM TARTRATE 5 MG PO TABS: 5.0000 mg | ORAL_TABLET | Freq: Every evening | ORAL | Status: DC | PRN

## 2021-03-05 MED ORDER — DIPHENHYDRAMINE HCL 25 MG PO CAPS: 25.0000 mg | ORAL_CAPSULE | Freq: Two times a day (BID) | ORAL | Status: DC | PRN

## 2021-03-05 MED ORDER — FLEET ENEMA 7-19 GM/118ML RE ENEM: 1.0000 | ENEMA | Freq: Once | RECTAL | Status: DC | PRN

## 2021-03-05 MED ORDER — DORZOLAMIDE HCL-TIMOLOL MAL 2-0.5 % OP SOLN
1.0000 [drp] | Freq: Two times a day (BID) | OPHTHALMIC | Status: DC
  Administered 2021-03-05: 1 [drp] via OPHTHALMIC
  Filled 2021-03-05: qty 10

## 2021-03-05 MED ORDER — CHOLESTYRAMINE LIGHT 4 G PO PACK
4.0000 g | PACK | Freq: Every day | ORAL | Status: DC
  Administered 2021-03-05: 4 g via ORAL
  Filled 2021-03-05: qty 1

## 2021-03-05 MED ORDER — ZINC SULFATE 220 (50 ZN) MG PO CAPS
220.0000 mg | ORAL_CAPSULE | Freq: Two times a day (BID) | ORAL | Status: DC
  Administered 2021-03-05: 220 mg via ORAL
  Filled 2021-03-05 (×3): qty 1

## 2021-03-05 MED ORDER — ONDANSETRON HCL 4 MG PO TABS: 4.0000 mg | ORAL_TABLET | Freq: Four times a day (QID) | ORAL | Status: DC | PRN

## 2021-03-05 MED ORDER — CHOLESTYRAMINE LIGHT 4 G PO PACK
4.0000 g | PACK | Freq: Once | ORAL | Status: AC
  Administered 2021-03-05: 4 g via ORAL
  Filled 2021-03-05: qty 1

## 2021-03-05 MED ORDER — POLYVINYL ALCOHOL 1.4 % OP SOLN
1.0000 [drp] | Freq: Two times a day (BID) | OPHTHALMIC | Status: DC
  Administered 2021-03-05: 1 [drp] via OPHTHALMIC
  Filled 2021-03-05: qty 15

## 2021-03-05 MED ORDER — SORBITOL 70 % SOLN
30.0000 mL | Freq: Every day | Status: DC | PRN
  Filled 2021-03-05: qty 30

## 2021-03-05 MED ORDER — VITAMIN D 25 MCG (1000 UNIT) PO TABS: 2000.0000 [IU] | ORAL_TABLET | Freq: Every day | ORAL | Status: DC

## 2021-03-05 MED ORDER — OXYBUTYNIN CHLORIDE 5 MG PO TABS
5.0000 mg | ORAL_TABLET | Freq: Three times a day (TID) | ORAL | Status: DC
  Administered 2021-03-05 – 2021-03-06 (×2): 5 mg via ORAL
  Filled 2021-03-05 (×2): qty 1

## 2021-03-05 MED ORDER — IRBESARTAN 150 MG PO TABS
300.0000 mg | ORAL_TABLET | Freq: Every day | ORAL | Status: DC
  Administered 2021-03-05: 300 mg via ORAL
  Filled 2021-03-05 (×2): qty 1

## 2021-03-05 MED ORDER — POLYETHYLENE GLYCOL 3350 17 G PO PACK: 17.0000 g | PACK | Freq: Every day | ORAL | Status: DC | PRN

## 2021-03-05 MED ORDER — PIPERACILLIN-TAZOBACTAM 3.375 G IVPB 30 MIN
3.3750 g | Freq: Once | INTRAVENOUS | Status: AC
  Administered 2021-03-05: 3.375 g via INTRAVENOUS
  Filled 2021-03-05: qty 50

## 2021-03-05 MED ORDER — BOOST / RESOURCE BREEZE PO LIQD CUSTOM
1.0000 | Freq: Three times a day (TID) | ORAL | Status: DC
  Administered 2021-03-05: 1 via ORAL

## 2021-03-05 MED ORDER — PIPERACILLIN-TAZOBACTAM 3.375 G IVPB
3.3750 g | Freq: Three times a day (TID) | INTRAVENOUS | Status: DC
  Administered 2021-03-05 – 2021-03-06 (×2): 3.375 g via INTRAVENOUS
  Filled 2021-03-05 (×6): qty 50

## 2021-03-05 MED ORDER — ACETAMINOPHEN 325 MG PO TABS: 650.0000 mg | ORAL_TABLET | Freq: Four times a day (QID) | ORAL | Status: DC | PRN

## 2021-03-05 MED ORDER — SODIUM CHLORIDE 0.9% FLUSH: 3.0000 mL | Freq: Two times a day (BID) | INTRAVENOUS | Status: DC

## 2021-03-05 MED ORDER — ASCORBIC ACID 500 MG PO TABS: 500.0000 mg | ORAL_TABLET | Freq: Every day | ORAL | Status: DC

## 2021-03-05 MED ORDER — CHOLESTYRAMINE 4 G PO PACK: 4.0000 g | PACK | Freq: Once | ORAL | Status: DC

## 2021-03-05 MED ORDER — ACETAMINOPHEN 650 MG RE SUPP: 650.0000 mg | Freq: Four times a day (QID) | RECTAL | Status: DC | PRN

## 2021-03-05 MED ORDER — ASPIRIN EC 81 MG PO TBEC: 81.0000 mg | DELAYED_RELEASE_TABLET | Freq: Every day | ORAL | Status: DC

## 2021-03-05 MED ORDER — OXYCODONE HCL 5 MG PO TABS: 5.0000 mg | ORAL_TABLET | ORAL | Status: DC | PRN

## 2021-03-05 MED ORDER — HYDROXYZINE HCL 25 MG PO TABS
25.0000 mg | ORAL_TABLET | Freq: Three times a day (TID) | ORAL | Status: DC | PRN
  Administered 2021-03-05: 25 mg via ORAL
  Filled 2021-03-05 (×2): qty 1

## 2021-03-05 MED ORDER — ONDANSETRON HCL 4 MG/2ML IJ SOLN: 4.0000 mg | Freq: Four times a day (QID) | INTRAMUSCULAR | Status: DC | PRN

## 2021-03-05 MED ORDER — CHOLESTYRAMINE LIGHT 4 G PO PACK
4.0000 g | PACK | Freq: Two times a day (BID) | ORAL | Status: DC
Start: 2021-03-06 — End: 2021-03-07
  Filled 2021-03-05 (×2): qty 1

## 2021-03-05 NOTE — H&P (View-Only) (Signed)
Referring Provider: ER Primary Care Physician:  Maury Dus, MD Primary Gastroenterologist:  Dr. Michail Sermon  Reason for Consultation:  Jaundice, AB pain, MRCP 01/04/2021 choledocholithiasis  HPI: Stanley Romero is a 75 y.o. male  Patient status postcholecystectomy March 2022.   Patient initially seen for elevated liver function in the office 12/2020, at that time patient was having some episodes of jaundice, AB pain but this had resolved.   Patient had MRCP 01/04/2021 that showed new mild diffuse biliary ductal dilatation due to choledocholithiasis. Trying to set up outpatient ERCP, however due to scheduling reasons this never occurred.   Given ER precautions  Patient called office 03/03/2021 with fever of 103 with chills, nausea, jaundice for 2 day with center abdominal pain.  Patient given ER precautions, instructed to come in for labs.   Patient drove up from [place] with his wife. Labs obtained at the office 03/05/2021 showed bilirubin 5.8, and alkaline phosphatase 580, AST 72 and ALT 91. Compared to labs 07/16/2020 in the office with bilirubin 1, alkaline phosphatase 156, AST 22 and ALT 24. Patient advised to go to [facility], ER.  Patient states he has been having gallbladder like attacks since May 15. More happening approximately once a month however starting in August started happening closer every 2 weeks and most recently increasing in frequency.  Patient states he had an episode Friday night and again Tuesday evening. Patient will have episodes of severe epigastric abdominal pain with chills and rigors with a temperature of 103 that generally last for about 2 hours.  During this time he will have dry heaving, pruritus, will have yellowing of skin and eyes, dark urine and light stools. Patient states worst of it was Tuesday evening into Wednesday, patient drove up from [place]. With labs obtained in the office instructed to come to the ER. At this time patient states he  continues to have jaundice, some mild epigastric pain no appetite. Denies current fever, chills, nausea, vomiting. Last BM yesterday and light stool color, loose. Patient's had total decrease of weight of 22 pounds since March after having gallbladder removed due to decreased appetite. Patient denies GI malignancy history. Last colonoscopy 2009, patient is overdue.  Denies melena, hematochezia.  Patient wife also expressed concern about tiny pancreatic cyst which patient's been monitored with MRCP every 2 years.  Patient states has 2 new lesions, however at after reviewing MRCP from 2022 2020 patient continues to have 3 very small pancreatic cystic lesions, 1 decreased in size, largest being 8 mm.  Continue follow-up outpatient.  MRCP 01/04/2021 Stable tiny cystic lesions in the pancreatic head and tail, largest measuring 8 mm. Continued follow-up by MRI is recommended in 2 years. This recommendation follows ACR consensus guidelines: Management of Incidental Pancreatic Cysts: A White Paper of the ACR Incidental Findings Committee. [place] 7829;56:213-086.   Prior cholecystectomy. New mild diffuse biliary ductal dilatation due to choledocholithiasis.   Past Medical History:  Diagnosis Date   Hepatitis 1973   High cholesterol    History of kidney stones 2010   Hypertension     Past Surgical History:  Procedure Laterality Date   APPENDECTOMY  1956   CHOLECYSTECTOMY N/A 06/26/2020   Procedure: LAPAROSCOPIC CHOLECYSTECTOMY;  Surgeon: Greer Pickerel, MD;  Location: [facility];  Service: General;  Laterality: N/A;   COLON SURGERY  2001   COLONOSCOPY  2010   FRACTURE SURGERY Right 1959   ROTATOR CUFF REPAIR Left 2008    Prior to Admission medications  Medication Sig Start Date End Date Taking? Authorizing Provider  acetaminophen (TYLENOL) 325 MG tablet Take 650 mg by mouth every 6 (six) hours as needed for moderate pain.    [provider]  aspirin EC 325 MG tablet Take  650 mg by mouth every 6 (six) hours as needed for moderate pain or mild pain.    [provider]  aspirin EC 81 MG tablet Take 81 mg by mouth daily. Swallow whole.    [provider]  atorvastatin (LIPITOR) 40 MG tablet Take 40 mg by mouth daily. 08/08/20   [provider]  b complex vitamins capsule Take 1 capsule by mouth daily.    [provider]  Carboxymethylcellul-Glycerin (LUBRICATING EYE DROPS OP) Place 1 drop into both eyes 2 (two) times daily.    [provider]  Cholecalciferol (VITAMIN D) 50 MCG (2000 UT) tablet Take 2,000 Units by mouth daily.    [provider]  diphenhydrAMINE (BENADRYL) 25 MG tablet Take 25 mg by mouth 2 (two) times daily as needed for allergies.    [provider]  dorzolamide-timolol (COSOPT) 22.3-6.8 MG/ML ophthalmic solution Place 1 drop into both eyes 2 (two) times daily. 01/18/21   [provider]  EPINEPHrine 0.3 mg/0.3 mL IJ SOAJ injection Inject 0.3 mg into the muscle as needed for anaphylaxis. 01/22/21   Garnet Sierras, DO  hydrocortisone cream 1 % Apply 1 application topically 2 (two) times daily as needed for itching.    [provider]  MAGNESIUM PO Take 1 tablet by mouth daily.    [provider]  oxybutynin (DITROPAN) 5 MG tablet Take 5 mg by mouth 3 (three) times daily. 01/18/21   [provider]  telmisartan (MICARDIS) 80 MG tablet Take 80 mg by mouth daily. 06/01/20   [provider]  TOVIAZ 4 MG TB24 tablet Take 4 mg by mouth daily. Patient not taking: Reported on 02/18/2021 01/30/20   [provider]  Turmeric 500 MG CAPS Take 500 mg by mouth 2 (two) times daily.    [provider]  vitamin C (ASCORBIC ACID) 500 MG tablet Take 500 mg by mouth daily.    [provider]  vitamin E 180 MG (400 UNITS) capsule Take 400 Units by mouth daily.    [provider]  zinc gluconate 50 MG tablet Take 50 mg by mouth 2 (two)  times daily.    [provider]  zolpidem (AMBIEN) 10 MG tablet Take 10 mg by mouth at bedtime as needed for sleep.    [provider]    Scheduled Meds: Continuous Infusions: PRN Meds:.  Allergies as of 03/05/2021 - Review Complete 03/05/2021  Allergen Reaction Noted   Bee venom Anaphylaxis 06/26/2020   Wasp venom Anaphylaxis 11/29/2020   Other Other (See Comments) 06/30/2016   Pollen extract Other (See Comments) 06/30/2016    Family History  Problem Relation Age of Onset   Allergic rhinitis Mother    Allergic rhinitis Sister    Asthma Neg Hx    Eczema Neg Hx    Urticaria Neg Hx     Social History   Socioeconomic History   Marital status: Single    Spouse name: Not on file   Number of children: Not on file   Years of education: Not on file   Highest education level: Not on file  Occupational History   Not on file  Tobacco Use   Smoking status: Never   Smokeless tobacco: Never  Vaping Use   Vaping Use: Never used  Substance and Sexual Activity   Alcohol use: Yes    Alcohol/week: 24.0 standard drinks    Types: 14 Glasses of wine, 10 Standard drinks or equivalent per week   Drug use: Never   Sexual activity: Not on file  Other Topics Concern   Not on file  Social History Narrative   Not on file   Social Determinants of Health   Financial Resource Strain: Not on file  Food Insecurity: Not on file  Transportation Needs: Not on file  Physical Activity: Not on file  Stress: Not on file  Social Connections: Not on file  Intimate Partner Violence: Not on file    Review of Systems:  Review of Systems  Constitutional:  Positive for chills and fever (resolved at this time).  HENT:  Negative for sore throat.   Respiratory:  Negative for sputum production.   Cardiovascular:  Negative for chest pain and leg swelling.  Gastrointestinal:  Positive for abdominal pain and nausea. Negative for blood in stool, constipation, diarrhea, heartburn, melena  and vomiting.  Genitourinary:  Negative for dysuria, flank pain and frequency.       Dark urine  Musculoskeletal:  Negative for falls.  Skin:  Positive for itching.  Neurological:  Negative for loss of consciousness.  Psychiatric/Behavioral:  Negative for memory loss.    Physical Exam: Vital signs: Vitals:   03/05/21 1429  BP: 126/72  Pulse: 73  Resp: 20  Temp: 98.2 F (36.8 C)  SpO2: 96%     General:   Alert, in NAD, + sclera icterus Heart:  Regular rate and rhythm; no murmurs Pulm: Clear anteriorly; no wheezing Abdomen:  Soft, Flat AB, skin exam scars- well healed lap choley scarsNormal bowel sounds. mild tenderness in the RUQ. Without guarding and Without rebound, without hepatomegaly. Extremities:  Without edema. Neurologic:  Alert and  oriented x4;  grossly normal neurologically. Psych:  Alert and cooperative. Normal mood and affect.  GI:  Lab Results: No results for input(s): WBC, HGB, HCT, PLT in the last 72 hours. BMET No results for input(s): NA, K, CL, CO2, GLUCOSE, BUN, CREATININE, CALCIUM in the last 72 hours. LFT No results for input(s): PROT, ALBUMIN, AST, ALT, ALKPHOS, BILITOT, BILIDIR, IBILI in the last 72 hours. PT/INR No results for input(s): LABPROT, INR in the last 72 hours.  Studies/Results: No results found.  Impression Choledocholithiasis s/p cholecystectomy  worsening jaundice, alkaline phosphatase, abdominal pain.  03/05/2021 showed bilirubin 5.8, and alkaline phosphatase 580, AST 72 and ALT 91. MRCP 12/2020 showed choledocholithiasis Vitals stable this visit  Pancreatic cysts Follow-up outpatient with MRCP  Cholestatic pruritis   Plan Start Zosyn.  Monitor liver function, CBC Check lipase.  We will plan for ERCP inpatient with Dr. Therisa Doyne tomorrow  Clear liquids tonight, NPO midnight  Add on atarax and cholestyramine for pruritis  I thoroughly discussed procedure with the patient to include nature, alternatives, benefits, and  risks (including but not limited to post ERCP pancreatitis, bleeding, infection, perforation, anesthesia/cardiac pulmonary complications).  Patient verbalized understanding and gave verbal consent to proceed with ERCP.    LOS: 0 days   Vladimir Crofts  PA-C 03/05/2021, 2:41 PM  Contact #  [phone]

## 2021-03-05 NOTE — ED Notes (Addendum)
Pt care taken, gave fluids at 181ml/hr, complaining of itching coming back.  Said the other medicine worked great for about an hour and a half.

## 2021-03-05 NOTE — Progress Notes (Signed)
Patient admitted to 1514. Denies pain or discomfort. Oriented to room and call bell. Bed low call with in reach.

## 2021-03-05 NOTE — ED Notes (Signed)
Called floor to get nurse for handoff.

## 2021-03-05 NOTE — Consult Note (Addendum)
Referring Provider: ER Primary Care Physician:  Maury Dus, MD Primary Gastroenterologist:  Dr. Michail Sermon  Reason for Consultation:  Jaundice, AB pain, MRCP 01/04/2021 choledocholithiasis  HPI: Stanley Romero is a 75 y.o. male  Patient status postcholecystectomy March 2022.   Patient initially seen for elevated liver function in the office 12/2020, at that time patient was having some episodes of jaundice, AB pain but this had resolved.   Patient had MRCP 01/04/2021 that showed new mild diffuse biliary ductal dilatation due to choledocholithiasis. Trying to set up outpatient ERCP, however due to scheduling reasons this never occurred.   Given ER precautions  Patient called office 03/03/2021 with fever of 103 with chills, nausea, jaundice for 2 day with center abdominal pain.  Patient given ER precautions, instructed to come in for labs.   Patient drove up from [place] with his wife. Labs obtained at the office 03/05/2021 showed bilirubin 5.8, and alkaline phosphatase 580, AST 72 and ALT 91. Compared to labs 07/16/2020 in the office with bilirubin 1, alkaline phosphatase 156, AST 22 and ALT 24. Patient advised to go to [facility], ER.  Patient states he has been having gallbladder like attacks since May 15. More happening approximately once a month however starting in August started happening closer every 2 weeks and most recently increasing in frequency.  Patient states he had an episode Friday night and again Tuesday evening. Patient will have episodes of severe epigastric abdominal pain with chills and rigors with a temperature of 103 that generally last for about 2 hours.  During this time he will have dry heaving, pruritus, will have yellowing of skin and eyes, dark urine and light stools. Patient states worst of it was Tuesday evening into Wednesday, patient drove up from [place]. With labs obtained in the office instructed to come to the ER. At this time patient states he  continues to have jaundice, some mild epigastric pain no appetite. Denies current fever, chills, nausea, vomiting. Last BM yesterday and light stool color, loose. Patient's had total decrease of weight of 22 pounds since March after having gallbladder removed due to decreased appetite. Patient denies GI malignancy history. Last colonoscopy 2009, patient is overdue.  Denies melena, hematochezia.  Patient wife also expressed concern about tiny pancreatic cyst which patient's been monitored with MRCP every 2 years.  Patient states has 2 new lesions, however at after reviewing MRCP from 2022 2020 patient continues to have 3 very small pancreatic cystic lesions, 1 decreased in size, largest being 8 mm.  Continue follow-up outpatient.  MRCP 01/04/2021 Stable tiny cystic lesions in the pancreatic head and tail, largest measuring 8 mm. Continued follow-up by MRI is recommended in 2 years. This recommendation follows ACR consensus guidelines: Management of Incidental Pancreatic Cysts: A White Paper of the ACR Incidental Findings Committee. [place] 6295;28:413-244.   Prior cholecystectomy. New mild diffuse biliary ductal dilatation due to choledocholithiasis.   Past Medical History:  Diagnosis Date   Hepatitis 1973   High cholesterol    History of kidney stones 2010   Hypertension     Past Surgical History:  Procedure Laterality Date   APPENDECTOMY  1956   CHOLECYSTECTOMY N/A 06/26/2020   Procedure: LAPAROSCOPIC CHOLECYSTECTOMY;  Surgeon: Greer Pickerel, MD;  Location: [facility];  Service: General;  Laterality: N/A;   COLON SURGERY  2001   COLONOSCOPY  2010   FRACTURE SURGERY Right 1959   ROTATOR CUFF REPAIR Left 2008    Prior to Admission medications  Medication Sig Start Date End Date Taking? Authorizing Provider  acetaminophen (TYLENOL) 325 MG tablet Take 650 mg by mouth every 6 (six) hours as needed for moderate pain.    [provider]  aspirin EC 325 MG tablet Take  650 mg by mouth every 6 (six) hours as needed for moderate pain or mild pain.    [provider]  aspirin EC 81 MG tablet Take 81 mg by mouth daily. Swallow whole.    [provider]  atorvastatin (LIPITOR) 40 MG tablet Take 40 mg by mouth daily. 08/08/20   [provider]  b complex vitamins capsule Take 1 capsule by mouth daily.    [provider]  Carboxymethylcellul-Glycerin (LUBRICATING EYE DROPS OP) Place 1 drop into both eyes 2 (two) times daily.    [provider]  Cholecalciferol (VITAMIN D) 50 MCG (2000 UT) tablet Take 2,000 Units by mouth daily.    [provider]  diphenhydrAMINE (BENADRYL) 25 MG tablet Take 25 mg by mouth 2 (two) times daily as needed for allergies.    [provider]  dorzolamide-timolol (COSOPT) 22.3-6.8 MG/ML ophthalmic solution Place 1 drop into both eyes 2 (two) times daily. 01/18/21   [provider]  EPINEPHrine 0.3 mg/0.3 mL IJ SOAJ injection Inject 0.3 mg into the muscle as needed for anaphylaxis. 01/22/21   Garnet Sierras, DO  hydrocortisone cream 1 % Apply 1 application topically 2 (two) times daily as needed for itching.    [provider]  MAGNESIUM PO Take 1 tablet by mouth daily.    [provider]  oxybutynin (DITROPAN) 5 MG tablet Take 5 mg by mouth 3 (three) times daily. 01/18/21   [provider]  telmisartan (MICARDIS) 80 MG tablet Take 80 mg by mouth daily. 06/01/20   [provider]  TOVIAZ 4 MG TB24 tablet Take 4 mg by mouth daily. Patient not taking: Reported on 02/18/2021 01/30/20   [provider]  Turmeric 500 MG CAPS Take 500 mg by mouth 2 (two) times daily.    [provider]  vitamin C (ASCORBIC ACID) 500 MG tablet Take 500 mg by mouth daily.    [provider]  vitamin E 180 MG (400 UNITS) capsule Take 400 Units by mouth daily.    [provider]  zinc gluconate 50 MG tablet Take 50 mg by mouth 2 (two)  times daily.    [provider]  zolpidem (AMBIEN) 10 MG tablet Take 10 mg by mouth at bedtime as needed for sleep.    [provider]    Scheduled Meds: Continuous Infusions: PRN Meds:.  Allergies as of 03/05/2021 - Review Complete 03/05/2021  Allergen Reaction Noted   Bee venom Anaphylaxis 06/26/2020   Wasp venom Anaphylaxis 11/29/2020   Other Other (See Comments) 06/30/2016   Pollen extract Other (See Comments) 06/30/2016    Family History  Problem Relation Age of Onset   Allergic rhinitis Mother    Allergic rhinitis Sister    Asthma Neg Hx    Eczema Neg Hx    Urticaria Neg Hx     Social History   Socioeconomic History   Marital status: Single    Spouse name: Not on file   Number of children: Not on file   Years of education: Not on file   Highest education level: Not on file  Occupational History   Not on file  Tobacco Use   Smoking status: Never   Smokeless tobacco: Never  Vaping Use   Vaping Use: Never used  Substance and Sexual Activity   Alcohol use: Yes    Alcohol/week: 24.0 standard drinks    Types: 14 Glasses of wine, 10 Standard drinks or equivalent per week   Drug use: Never   Sexual activity: Not on file  Other Topics Concern   Not on file  Social History Narrative   Not on file   Social Determinants of Health   Financial Resource Strain: Not on file  Food Insecurity: Not on file  Transportation Needs: Not on file  Physical Activity: Not on file  Stress: Not on file  Social Connections: Not on file  Intimate Partner Violence: Not on file    Review of Systems:  Review of Systems  Constitutional:  Positive for chills and fever (resolved at this time).  HENT:  Negative for sore throat.   Respiratory:  Negative for sputum production.   Cardiovascular:  Negative for chest pain and leg swelling.  Gastrointestinal:  Positive for abdominal pain and nausea. Negative for blood in stool, constipation, diarrhea, heartburn, melena  and vomiting.  Genitourinary:  Negative for dysuria, flank pain and frequency.       Dark urine  Musculoskeletal:  Negative for falls.  Skin:  Positive for itching.  Neurological:  Negative for loss of consciousness.  Psychiatric/Behavioral:  Negative for memory loss.    Physical Exam: Vital signs: Vitals:   03/05/21 1429  BP: 126/72  Pulse: 73  Resp: 20  Temp: 98.2 F (36.8 C)  SpO2: 96%     General:   Alert, in NAD, + sclera icterus Heart:  Regular rate and rhythm; no murmurs Pulm: Clear anteriorly; no wheezing Abdomen:  Soft, Flat AB, skin exam scars- well healed lap choley scarsNormal bowel sounds. mild tenderness in the RUQ. Without guarding and Without rebound, without hepatomegaly. Extremities:  Without edema. Neurologic:  Alert and  oriented x4;  grossly normal neurologically. Psych:  Alert and cooperative. Normal mood and affect.  GI:  Lab Results: No results for input(s): WBC, HGB, HCT, PLT in the last 72 hours. BMET No results for input(s): NA, K, CL, CO2, GLUCOSE, BUN, CREATININE, CALCIUM in the last 72 hours. LFT No results for input(s): PROT, ALBUMIN, AST, ALT, ALKPHOS, BILITOT, BILIDIR, IBILI in the last 72 hours. PT/INR No results for input(s): LABPROT, INR in the last 72 hours.  Studies/Results: No results found.  Impression Choledocholithiasis s/p cholecystectomy  worsening jaundice, alkaline phosphatase, abdominal pain.  03/05/2021 showed bilirubin 5.8, and alkaline phosphatase 580, AST 72 and ALT 91. MRCP 12/2020 showed choledocholithiasis Vitals stable this visit  Pancreatic cysts Follow-up outpatient with MRCP  Cholestatic pruritis   Plan Start Zosyn.  Monitor liver function, CBC Check lipase.  We will plan for ERCP inpatient with Dr. Therisa Doyne tomorrow  Clear liquids tonight, NPO midnight  Add on atarax and cholestyramine for pruritis  I thoroughly discussed procedure with the patient to include nature, alternatives, benefits, and  risks (including but not limited to post ERCP pancreatitis, bleeding, infection, perforation, anesthesia/cardiac pulmonary complications).  Patient verbalized understanding and gave verbal consent to proceed with ERCP.    LOS: 0 days   Vladimir Crofts  PA-C 03/05/2021, 2:41 PM  Contact #  [phone]

## 2021-03-05 NOTE — ED Provider Notes (Signed)
[facility] DEPT Provider Note   CSN: [id number] Arrival date & time: 03/05/21  1351     History Chief Complaint  Patient presents with   Abdominal Pain   Nausea   Emesis   itching    ROCHESTER SERPE is a 75 y.o. male.  Patient is a past medical history of hypertension, hypercholesterolemia, cholecystectomy in March 2022.  He presents the emergency department today with complaints of intermittent gallbladder attacks.  He describes these as having 103 degree fever, chills, jaundice, dark urine, extreme itching, nausea, vomiting, right upper quadrant abdominal pain.  He says these episodes do not last very long.  They initially started about once a month after his gallbladder was removed.  They now occur almost weekly.  His last attack was yesterday.  Right now he says is not having any abdominal pain, but he is more jaundiced than normal, his urine is darkened, and he is having extreme itching.  Be set up with outpatient ERCP, but due to scheduling reasons this never occurred.  Gastroenterology doctor today with another attack.  His labs in the office showed a bilirubin of 5.8, alkaline phosphatase of 580, AST of 72, and ALT of 91.  This was highly elevated compared to previous labs.  He was advised to come to the emergency department to have ERCP done in the hospital.   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   Diverticulitis 03/05/2021   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   Syncope and collapse 11/29/2020   Hypokalemia 11/29/2020   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   AKI (acute kidney injury) ([place]) 11/29/2020   Leukocytosis 11/29/2020   Elevated troponin 11/29/2020   Hypertension 08/17/2016   Hypercholesterolemia 08/17/2016    Past Surgical History:  Procedure Laterality Date   APPENDECTOMY  1956   CHOLECYSTECTOMY N/A 06/26/2020   Procedure: LAPAROSCOPIC CHOLECYSTECTOMY;  Surgeon: Greer Pickerel, MD;  Location: [facility];  Service: General;  Laterality: N/A;   COLON SURGERY  2001   COLONOSCOPY  2010   FRACTURE SURGERY Right 1959   ROTATOR CUFF REPAIR Left 2008       Family History  Problem Relation Age of Onset   Allergic rhinitis Mother    Heart failure Father    Allergic rhinitis Sister    Asthma Neg Hx    Eczema Neg Hx    Urticaria Neg Hx     Social History   Tobacco Use   Smoking status: Never   Smokeless tobacco: Never  Vaping Use   Vaping Use: Never used  Substance Use Topics   Alcohol use: Yes    Alcohol/week: 24.0 standard drinks    Types: 14 Glasses of wine, 10 Standard drinks or equivalent per week   Drug use: Never    Home Medications Prior to Admission medications   Medication Sig Start Date End  Date Taking? Authorizing Provider  acetaminophen (TYLENOL) 325 MG tablet Take 650 mg by mouth every 6 (six) hours as needed for moderate pain.   Yes [provider]  aspirin EC 81 MG tablet Take 81 mg by mouth daily. Swallow whole.   Yes [provider]  atorvastatin (LIPITOR) 40 MG tablet Take 40 mg by mouth daily. 08/08/20  Yes [provider]  b complex vitamins capsule Take 1 capsule by mouth daily.   Yes [provider]  Carboxymethylcellul-Glycerin (LUBRICATING EYE DROPS OP) Place 1 drop into both eyes 2 (two) times daily.   Yes [provider]   Cholecalciferol (VITAMIN D) 50 MCG (2000 UT) tablet Take 2,000 Units by mouth daily.   Yes [provider]  diphenhydrAMINE (BENADRYL) 25 MG tablet Take 25 mg by mouth 2 (two) times daily as needed for allergies.   Yes [provider]  dorzolamide-timolol (COSOPT) 22.3-6.8 MG/ML ophthalmic solution Place 1 drop into both eyes 2 (two) times daily. 01/18/21  Yes [provider]  EPINEPHrine 0.3 mg/0.3 mL IJ SOAJ injection Inject 0.3 mg into the muscle as needed for anaphylaxis. 01/22/21  Yes Garnet Sierras, DO  hydrocortisone cream 1 % Apply 1 application topically 2 (two) times daily as needed for itching.   Yes [provider]  MAGNESIUM PO Take 1 tablet by mouth daily.   Yes [provider]  oxybutynin (DITROPAN) 5 MG tablet Take 5 mg by mouth 3 (three) times daily. 01/18/21  Yes [provider]  telmisartan (MICARDIS) 80 MG tablet Take 80 mg by mouth daily. 06/01/20  Yes [provider]  Turmeric 500 MG CAPS Take 500 mg by mouth 2 (two) times daily.   Yes [provider]  vitamin C (ASCORBIC ACID) 500 MG tablet Take 500 mg by mouth daily.   Yes [provider]  vitamin E 180 MG (400 UNITS) capsule Take 400 Units by mouth daily.   Yes [provider]  zinc gluconate 50 MG tablet Take 50 mg by mouth 2 (two) times daily.   Yes [provider]  zolpidem (AMBIEN) 10 MG tablet Take 10 mg by mouth at bedtime as needed for sleep.   Yes [provider]    Allergies    Bee venom, Wasp venom, Other, and Pollen extract  Review of Systems   Review of Systems  Constitutional:  Positive for chills and fever.  HENT:  Negative for congestion, rhinorrhea and sore throat.   Eyes:  Negative for visual disturbance.  Respiratory:  Negative for cough, chest tightness and shortness of breath.   Cardiovascular:  Negative for chest pain, palpitations and leg swelling.  Gastrointestinal:  Positive for abdominal  pain, nausea and vomiting. Negative for blood in stool, constipation and diarrhea.  Genitourinary:  Negative for dysuria, flank pain and hematuria.       Dark urine  Musculoskeletal:  Negative for back pain.  Skin:  Positive for color change. Negative for rash and wound.       Itching jaundice  Neurological:  Negative for dizziness, syncope, weakness, light-headedness and headaches.  Psychiatric/Behavioral:  Negative for confusion.   All other systems reviewed and are negative.  Physical Exam Updated Vital Signs BP 137/74   Pulse 68   Temp 98.5 F (36.9 C) (Oral)   Resp 18   SpO2 94%   Physical Exam Vitals and nursing note reviewed.  Constitutional:      General: He is not in acute distress.  Appearance: Normal appearance. He is not ill-appearing, toxic-appearing or diaphoretic.  HENT:     Head: Normocephalic and atraumatic.     Nose: No nasal deformity.     Mouth/Throat:     Lips: Pink. No lesions.     Mouth: Mucous membranes are moist. No injury, lacerations, oral lesions or angioedema.     Pharynx: Oropharynx is clear. Uvula midline. No pharyngeal swelling, oropharyngeal exudate, posterior oropharyngeal erythema or uvula swelling.  Eyes:     General: Gaze aligned appropriately. No scleral icterus.       Right eye: No discharge.        Left eye: No discharge.     Conjunctiva/sclera: Conjunctivae normal.     Right eye: Right conjunctiva is not injected. No exudate or hemorrhage.    Left eye: Left conjunctiva is not injected. No exudate or hemorrhage.    Pupils: Pupils are equal, round, and reactive to light.  Cardiovascular:     Rate and Rhythm: Normal rate and regular rhythm.     Pulses: Normal pulses.          Radial pulses are 2+ on the right side and 2+ on the left side.       Dorsalis pedis pulses are 2+ on the right side and 2+ on the left side.     Heart sounds: Normal heart sounds, S1 normal and S2 normal. Heart sounds not distant. No murmur heard.   No  friction rub. No gallop. No S3 or S4 sounds.  Pulmonary:     Effort: Pulmonary effort is normal. No accessory muscle usage or respiratory distress.     Breath sounds: Normal breath sounds. No stridor. No wheezing, rhonchi or rales.  Chest:     Chest wall: No tenderness.  Abdominal:     General: Abdomen is flat. Bowel sounds are normal. There is no distension.     Palpations: Abdomen is soft. There is no shifting dullness, fluid wave, hepatomegaly, splenomegaly, mass or pulsatile mass.     Tenderness: There is no abdominal tenderness. There is no right CVA tenderness, left CVA tenderness, guarding or rebound. Negative signs include Murphy's sign.  Musculoskeletal:     Right lower leg: No edema.     Left lower leg: No edema.  Skin:    General: Skin is warm and dry.     Coloration: Skin is jaundiced. Skin is not pale.     Findings: No bruising, erythema, lesion or rash.  Neurological:     General: No focal deficit present.     Mental Status: He is alert and oriented to person, place, and time.     GCS: GCS eye subscore is 4. GCS verbal subscore is 5. GCS motor subscore is 6.  Psychiatric:        Mood and Affect: Mood normal.        Behavior: Behavior normal. Behavior is cooperative.    ED Results / Procedures / Treatments   Labs (all labs ordered are listed, but only abnormal results are displayed) Labs Reviewed  COMPREHENSIVE METABOLIC PANEL - Abnormal; Notable for the following components:      Result Value   Glucose, Bld 113 (*)    AST 81 (*)    ALT 101 (*)    Alkaline Phosphatase 554 (*)    Total Bilirubin 5.7 (*)    All other components within normal limits  URINALYSIS, ROUTINE W REFLEX MICROSCOPIC - Abnormal; Notable for the following components:   Color, Urine AMBER (*)  Hgb urine dipstick SMALL (*)    All other components within normal limits  RESP PANEL BY RT-PCR (FLU A&B, COVID) ARPGX2  CBC WITH DIFFERENTIAL/PLATELET  LIPASE, BLOOD    EKG None  Radiology No  results found.  Procedures Procedures   Medications Ordered in ED Medications  hydrOXYzine (ATARAX/VISTARIL) tablet 25 mg (25 mg Oral Given 03/05/21 2006)  piperacillin-tazobactam (ZOSYN) IVPB 3.375 g (has no administration in time range)  irbesartan (AVAPRO) tablet 300 mg (has no administration in time range)  oxybutynin (DITROPAN) tablet 5 mg (has no administration in time range)  0.9 %  sodium chloride infusion ( Intravenous New Bag/Given 03/05/21 1929)  cholestyramine light (PREVALITE) packet 4 g (has no administration in time range)  cholestyramine light (PREVALITE) packet 4 g (has no administration in time range)  piperacillin-tazobactam (ZOSYN) IVPB 3.375 g (0 g Intravenous Stopped 03/05/21 1734)    ED Course  I have reviewed the triage vital signs and the nursing notes.  Pertinent labs & imaging results that were available during my care of the patient were reviewed by me and considered in my medical decision making (see chart for details).  Clinical Course as of 03/05/21 2021  Thu Mar 05, 2021  1629 Will get admitted for ERCP  [GL]  1756 Dr. Grandville Silos to admit patient. [GL]    Clinical Course User Index [GL] Harshini Trent, Adora Fridge, PA-C   MDM Rules/Calculators/A&P                           Patient presents with apparent cholestatic episode status post cholecystectomy.  He was sent here by his gastroenterology doctor.  Apparently he has been having intermittent gallbladder attacks that are happening more frequently where he will become jaundiced, have abdominal pain, nausea, vomiting, extreme itching, fevers, and chills.  He was found to have elevated bilirubin and LFTs compared to baseline.  He was then sent to the emergency department for evaluation with ERCP. His initial vitals with afebrile and vital signs stable. His exam today with no abdominal pain.  He has jaundice and is visibly itching his skin.  He appears to be overall in no acute distress.  Labs are reviewed with  AST of 81, ALT of 101, alkaline phosphatase of 554, T bili of 5.7.  He has no leukocytosis.  Lipase normal.  Dr. Therisa Doyne has seen patient in the emergency department.  Plan for ERCP tomorrow.  Will be started on Zosyn, cholestyramine, and hydroxyzine tonight for chest symptoms.  He should be n.p.o. at midnight and on clear liquid diet prior to this.  Plan to admit patient to hospitalist service.  1756, spoke with Dr. Grandville Silos who will admit patient for suspected cholestasis and plan for ERCP.   Final Clinical Impression(s) / ED Diagnoses Final diagnoses:  Cholestatic jaundice    Rx / DC Orders ED Discharge Orders     None        Adolphus Birchwood, PA-C 03/05/21 2021    Truddie Hidden, MD 03/05/21 2034

## 2021-03-05 NOTE — Progress Notes (Signed)
Pharmacy Antibiotic Note  Stanley Romero is a 75 y.o. male admitted on 03/05/2021 with cholestatic pruritis .  Pharmacy has been consulted for Zosyn dosing.  Plan: Zosyn 3.375g IV q8h (4 hour infusion). No dose adjustments needed, Pharmacy will sign off    Temp (24hrs), Avg:98.2 F (36.8 C), Min:98.2 F (36.8 C), Max:98.2 F (36.8 C)  No results for input(s): WBC, CREATININE, LATICACIDVEN, VANCOTROUGH, VANCOPEAK, VANCORANDOM, GENTTROUGH, GENTPEAK, GENTRANDOM, TOBRATROUGH, TOBRAPEAK, TOBRARND, AMIKACINPEAK, AMIKACINTROU, AMIKACIN in the last 168 hours.  CrCl cannot be calculated (Patient's most recent lab result is older than the maximum 21 days allowed.).    Allergies  Allergen Reactions   Bee Venom Anaphylaxis   Wasp Venom Anaphylaxis   Other Other (See Comments)    Mold  No known reaction.    Pollen Extract Other (See Comments)    No known reaction.     Antimicrobials this admission: 11/10 Zosyn >>  Dose adjustments this admission: None  Microbiology results: None  Thank you for allowing pharmacy to be a part of this patient's care.  Peggyann Juba, PharmD, BCPS Pharmacy: [phone] 03/05/2021 3:34 PM

## 2021-03-05 NOTE — ED Triage Notes (Signed)
The patient had his gallbladder removed in March of this year. Since then he has had what he refers to as "attacks." These attacks consist of 103 fevers, chills, jaundice, dark urine, itching, nausea, vomiting and abdominal pain. In September an MRI found sludge. Since September attacks have increased. He was scheduled to have an ERCT November the 1st, but it was rescheduled to December 23rd. MD told him to come here.

## 2021-03-05 NOTE — H&P (Signed)
History and Physical    Stanley Romero GUY:[id number] DOB: [birth year] DOA: 03/05/2021  PCP: Maury Dus, MD (Confirm with patient/family/NH records and if not entered, this has to be entered at [facility] point of entry) Patient coming from: Home  I have personally briefly reviewed patient's old medical records in [place]  Chief Complaint: Abnormal labs/nausea emesis itching abdominal pain  HPI: Stanley Romero is a 75 y.o. male with medical history significant of hypertension, hyperlipidemia, history of kidney stones, cholecystectomy March 2022 presenting to the ED with complaints of intermittent gallbladder attacks as he describes it. Patient states ever since his cholecystectomy March 2022 he has been having what he describes as gallbladder attacks monthly which have progressed from monthly to every 2 weeks and then subsequently to weekly with last attack 2 days prior to admission.  Patient describes his attacks as fevers with temperatures as high as 103, chills, nausea, nonbloody emesis, epigastric abdominal pain, jaundice, extreme itching and states symptoms last for about 8 to 10 hours and subsequently resolved.  Patient does endorse some lightheadedness during these episodes.  With elevated blood pressure.  Patient also endorses a 22 pound weight loss over the past 6 months.  Patient denies any chest pain, no shortness of breath, no diarrhea, no constipation, no syncope, no melena, no hematemesis, no hematochezia.  Patient states went to gastroenterologist office today for blood work and was called back to present to the ED for further evaluation and to be set up for ERCP to be done in house tomorrow 03/06/2021.  ED Course: Patient seen in the ED vital signs stable.  Labs obtained with comprehensive metabolic profile with a glucose of 113, alk phosphatase of 554, AST 81, ALT 101, bilirubin 5.7 otherwise was within normal limits.  CBC unremarkable.  COVID-19 PCR and influenza PCR pending.   Urinalysis done nitrite negative leukocytes negative protein negative bilirubin negative.  Patient seen in consultation by GI who requested medical admission for further evaluation and management  Review of Systems: As per HPI otherwise all other systems reviewed and are negative.   Past Medical History:  Diagnosis Date   Diverticulitis    Hepatitis 1973   High cholesterol    History of kidney stones 2010   Hypertension     Past Surgical History:  Procedure Laterality Date   APPENDECTOMY  1956   CHOLECYSTECTOMY N/A 06/26/2020   Procedure: LAPAROSCOPIC CHOLECYSTECTOMY;  Surgeon: Greer Pickerel, MD;  Location: [facility];  Service: General;  Laterality: N/A;   COLON SURGERY  2001   COLONOSCOPY  2010   FRACTURE SURGERY Right 1959   ROTATOR CUFF REPAIR Left 2008    Social History  reports that he has never smoked. He has never used smokeless tobacco. He reports current alcohol use of about 24.0 standard drinks per week. He reports that he does not use drugs.  Allergies  Allergen Reactions   Bee Venom Anaphylaxis   Wasp Venom Anaphylaxis   Other Other (See Comments)    Mold  No known reaction.    Pollen Extract Other (See Comments)    No known reaction.     Family History  Problem Relation Age of Onset   Allergic rhinitis Mother    Heart failure Father    Allergic rhinitis Sister    Asthma Neg Hx    Eczema Neg Hx    Urticaria Neg Hx    Mother deceased age 38 per patient from old age.  Father deceased age 63 from CHF  Prior to Admission medications   Medication Sig Start Date End Date Taking? Authorizing Provider  acetaminophen (TYLENOL) 325 MG tablet Take 650 mg by mouth every 6 (six) hours as needed for moderate pain.   Yes [provider]  aspirin EC 81 MG tablet Take 81 mg by mouth daily. Swallow whole.   Yes [provider]  atorvastatin (LIPITOR) 40 MG tablet Take 40 mg by mouth daily. 08/08/20   [provider]  b complex vitamins capsule  Take 1 capsule by mouth daily.    [provider]  Carboxymethylcellul-Glycerin (LUBRICATING EYE DROPS OP) Place 1 drop into both eyes 2 (two) times daily.    [provider]  Cholecalciferol (VITAMIN D) 50 MCG (2000 UT) tablet Take 2,000 Units by mouth daily.    [provider]  diphenhydrAMINE (BENADRYL) 25 MG tablet Take 25 mg by mouth 2 (two) times daily as needed for allergies.    [provider]  dorzolamide-timolol (COSOPT) 22.3-6.8 MG/ML ophthalmic solution Place 1 drop into both eyes 2 (two) times daily. 01/18/21   [provider]  EPINEPHrine 0.3 mg/0.3 mL IJ SOAJ injection Inject 0.3 mg into the muscle as needed for anaphylaxis. 01/22/21   Garnet Sierras, DO  hydrocortisone cream 1 % Apply 1 application topically 2 (two) times daily as needed for itching.    [provider]  MAGNESIUM PO Take 1 tablet by mouth daily.    [provider]  oxybutynin (DITROPAN) 5 MG tablet Take 5 mg by mouth 3 (three) times daily. 01/18/21   [provider]  telmisartan (MICARDIS) 80 MG tablet Take 80 mg by mouth daily. 06/01/20   [provider]  Turmeric 500 MG CAPS Take 500 mg by mouth 2 (two) times daily.    [provider]  vitamin C (ASCORBIC ACID) 500 MG tablet Take 500 mg by mouth daily.    [provider]  vitamin E 180 MG (400 UNITS) capsule Take 400 Units by mouth daily.    [provider]  zinc gluconate 50 MG tablet Take 50 mg by mouth 2 (two) times daily.    [provider]  zolpidem (AMBIEN) 10 MG tablet Take 10 mg by mouth at bedtime as needed for sleep.    [provider]    Physical Exam: Vitals:   03/05/21 1429 03/05/21 1430 03/05/21 1715  BP: 126/72 126/72 126/72  Pulse: 73 76 65  Resp: 20  18  Temp: 98.2 F (36.8 C)    TempSrc: Oral    SpO2: 96% 94% 100%    Constitutional: NAD, calm, comfortable Vitals:   03/05/21 1429 03/05/21 1430 03/05/21 1715  BP:  126/72 126/72 126/72  Pulse: 73 76 65  Resp: 20  18  Temp: 98.2 F (36.8 C)    TempSrc: Oral    SpO2: 96% 94% 100%   Eyes: PERRL, lids and conjunctivae normal.  Muddy/slight scleral icterus. ENMT: Mucous membranes are moist. Posterior pharynx clear of any exudate or lesions.Normal dentition.  Neck: normal, supple, no masses, no thyromegaly Respiratory: clear to auscultation bilaterally, no wheezing, no crackles. Normal respiratory effort. No accessory muscle use.  Cardiovascular: Regular rate and rhythm, no murmurs / rubs / gallops. No extremity edema. 2+ pedal pulses. No carotid bruits.  Abdomen: no tenderness, no masses palpated. No hepatosplenomegaly. Bowel sounds positive.  Musculoskeletal: no clubbing / cyanosis. No joint deformity upper and lower extremities. Good ROM, no contractures. Normal muscle tone.  Skin: no rashes, lesions, ulcers.  No induration.  Slightly jaundiced. Neurologic: CN 2-12 grossly intact. Sensation intact, DTR normal. Strength 5/5 in all 4.  Psychiatric: Normal judgment and insight. Alert and oriented x 3. Normal mood.  Labs on Admission: I have personally reviewed following labs and imaging studies  CBC: Recent Labs  Lab 03/05/21 1512  WBC 5.8  NEUTROABS 3.9  HGB 14.9  HCT 45.5  MCV 99.3  PLT 503    Basic Metabolic Panel: Recent Labs  Lab 03/05/21 1512  NA 135  K 3.9  CL 101  CO2 26  GLUCOSE 113*  BUN 12  CREATININE 0.72  CALCIUM 9.8    GFR: CrCl cannot be calculated (Unknown ideal weight.).  Liver Function Tests: Recent Labs  Lab 03/05/21 1512  AST 81*  ALT 101*  ALKPHOS 554*  BILITOT 5.7*  PROT 7.4  ALBUMIN 3.8    Urine analysis:    Component Value Date/Time   COLORURINE AMBER (A) 03/05/2021 1634   APPEARANCEUR CLEAR 03/05/2021 1634   LABSPEC 1.011 03/05/2021 1634   PHURINE 5.0 03/05/2021 [place] 03/05/2021 1634   HGBUR SMALL (A) 03/05/2021 1634   BILIRUBINUR NEGATIVE 03/05/2021 [place] 03/05/2021 1634   PROTEINUR NEGATIVE 03/05/2021 1634   NITRITE NEGATIVE 03/05/2021 1634   LEUKOCYTESUR NEGATIVE 03/05/2021 1634    Radiological Exams on Admission: No results found.  EKG: Not done.  Assessment/Plan Principal Problem:   Choledocholithiasis Active Problems:   Cholestatic pruritus   Hypertension   Hypercholesterolemia   Diverticulitis    #1 choledocholithiasis status postcholecystectomy/cholestatic pruritus -Patient presenting with progressive worsening episodes of epigastric abdominal pain, extreme pruritus, jaundice, fever, chills, nausea, vomiting, 22 pound weight loss over 6 months.. -Patient noted to have abnormal LFTs with elevated alk phosphatase at 580, elevated AST 72 and ALT 91 and elevated bilirubin 5.8. -Patient noted to have had an MRCP done 01/04/2021 which showed stable tiny cystic lesions in the pancreatic head and tail, prior cholecystectomy, new mild diffuse biliary ductal dilatation due to choledocholithiasis. -Patient seen in consultation by GI who are recommending admission for ERCP to be done tomorrow. -GI has started patient on cholestyramine and hydroxyzine for severe pruritus as well as started patient on empiric IV Zosyn. -Patient for clear liquid diet tonight and n.p.o. after midnight in anticipation of procedure tomorrow.  2.  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  3.  BPH/urinary frequency -Resume home regimen Ditropan.  4.  Hyperlipidemia -Hold statin.  DVT prophylaxis: SCDs Code Status:   Full Family Communication:  Updated patient.  No family at bedside. Disposition Plan:   Patient is from:  Home  Anticipated DC to:  Home  Anticipated DC date:  1 to 2 days  Anticipated DC barriers: Clinical improvement  Consults called:  Gastroenterology: Sadie Haber, Dr. Therisa Doyne Admission status:  Place in observation/MedSurg.  Severity of Illness: The appropriate patient status for this patient is OBSERVATION. Observation status  is judged to be reasonable and necessary in order to provide the required intensity of service to ensure the patient's safety. The patient's presenting symptoms, physical exam findings, and initial radiographic and laboratory data in the context of their medical condition is felt to place them at decreased risk for further clinical deterioration. Furthermore, it is anticipated that the patient will be medically stable for discharge from the hospital within 2 midnights of admission.     Irine Seal MD [facility]  How to contact the [facility] Attending or Consulting provider [place] or covering provider  during after hours 7P -7A, for this patient?   Check the care team in [facility] and look for a) attending/consulting [facility] provider listed and b) the [facility] team listed Log into [email or web address] and use [employer]'s universal password to access. If you do not have the password, please contact the hospital operator. Locate the [facility] provider you are looking for under [facility] and page to a number that you can be directly reached. If you still have difficulty reaching the provider, please page the [facility] (Director on Call) for the Hospitalists listed on amion for assistance.  03/05/2021, 6:52 PM

## 2021-03-06 ENCOUNTER — Encounter (HOSPITAL_COMMUNITY): Payer: Self-pay | Admitting: Internal Medicine

## 2021-03-06 ENCOUNTER — Observation Stay (HOSPITAL_COMMUNITY): Payer: Medicare Other | Admitting: Certified Registered Nurse Anesthetist

## 2021-03-06 ENCOUNTER — Encounter (HOSPITAL_COMMUNITY)
Admission: EM | Disposition: A | Discharge status: Home / Self Care | Payer: Self-pay | Source: Home / Self Care | Attending: Emergency Medicine

## 2021-03-06 ENCOUNTER — Observation Stay (HOSPITAL_COMMUNITY): Payer: Medicare Other

## 2021-03-06 DIAGNOSIS — R17 Unspecified jaundice: Secondary | ICD-10-CM | POA: Diagnosis not present

## 2021-03-06 DIAGNOSIS — Z79899 Other long term (current) drug therapy: Secondary | ICD-10-CM | POA: Diagnosis not present

## 2021-03-06 DIAGNOSIS — K8050 Calculus of bile duct without cholangitis or cholecystitis without obstruction: Secondary | ICD-10-CM | POA: Diagnosis not present

## 2021-03-06 DIAGNOSIS — K8080 Other cholelithiasis without obstruction: Secondary | ICD-10-CM | POA: Diagnosis not present

## 2021-03-06 DIAGNOSIS — E7800 Pure hypercholesterolemia, unspecified: Secondary | ICD-10-CM | POA: Diagnosis not present

## 2021-03-06 DIAGNOSIS — E876 Hypokalemia: Secondary | ICD-10-CM | POA: Diagnosis not present

## 2021-03-06 DIAGNOSIS — Z9049 Acquired absence of other specified parts of digestive tract: Secondary | ICD-10-CM | POA: Diagnosis not present

## 2021-03-06 DIAGNOSIS — Z20822 Contact with and (suspected) exposure to covid-19: Secondary | ICD-10-CM | POA: Diagnosis not present

## 2021-03-06 DIAGNOSIS — R748 Abnormal levels of other serum enzymes: Secondary | ICD-10-CM | POA: Diagnosis not present

## 2021-03-06 DIAGNOSIS — R109 Unspecified abdominal pain: Secondary | ICD-10-CM | POA: Diagnosis not present

## 2021-03-06 DIAGNOSIS — K838 Other specified diseases of biliary tract: Secondary | ICD-10-CM | POA: Diagnosis not present

## 2021-03-06 DIAGNOSIS — R932 Abnormal findings on diagnostic imaging of liver and biliary tract: Secondary | ICD-10-CM | POA: Diagnosis not present

## 2021-03-06 DIAGNOSIS — Z7982 Long term (current) use of aspirin: Secondary | ICD-10-CM | POA: Diagnosis not present

## 2021-03-06 DIAGNOSIS — I10 Essential (primary) hypertension: Secondary | ICD-10-CM | POA: Diagnosis not present

## 2021-03-06 DIAGNOSIS — L298 Other pruritus: Secondary | ICD-10-CM | POA: Diagnosis not present

## 2021-03-06 HISTORY — PX: SPHINCTEROTOMY: SHX5544

## 2021-03-06 HISTORY — PX: ENDOSCOPIC RETROGRADE CHOLANGIOPANCREATOGRAPHY (ERCP) WITH PROPOFOL: SHX5810

## 2021-03-06 HISTORY — PX: REMOVAL OF STONES: SHX5545

## 2021-03-06 LAB — CBC WITH DIFFERENTIAL/PLATELET
Abs Immature Granulocytes: 0.01 10*3/uL (ref 0.00–0.07)
Basophils Absolute: 0 10*3/uL (ref 0.0–0.1)
Basophils Relative: 1 %
Eosinophils Absolute: 0.1 10*3/uL (ref 0.0–0.5)
Eosinophils Relative: 3 %
HCT: 43.4 % (ref 39.0–52.0)
Hemoglobin: 14.3 g/dL (ref 13.0–17.0)
Immature Granulocytes: 0 %
Lymphocytes Relative: 31 %
Lymphs Abs: 1.4 10*3/uL (ref 0.7–4.0)
MCH: 32.7 pg (ref 26.0–34.0)
MCHC: 32.9 g/dL (ref 30.0–36.0)
MCV: 99.3 fL (ref 80.0–100.0)
Monocytes Absolute: 0.6 10*3/uL (ref 0.1–1.0)
Monocytes Relative: 12 %
Neutro Abs: 2.5 10*3/uL (ref 1.7–7.7)
Neutrophils Relative %: 53 %
Platelets: 198 10*3/uL (ref 150–400)
RBC: 4.37 MIL/uL (ref 4.22–5.81)
RDW: 14.6 % (ref 11.5–15.5)
WBC: 4.6 10*3/uL (ref 4.0–10.5)
nRBC: 0 % (ref 0.0–0.2)

## 2021-03-06 LAB — COMPREHENSIVE METABOLIC PANEL
ALT: 81 U/L — ABNORMAL HIGH (ref 0–44)
AST: 66 U/L — ABNORMAL HIGH (ref 15–41)
Albumin: 3.2 g/dL — ABNORMAL LOW (ref 3.5–5.0)
Alkaline Phosphatase: 492 U/L — ABNORMAL HIGH (ref 38–126)
Anion gap: 9 (ref 5–15)
BUN: 12 mg/dL (ref 8–23)
CO2: 25 mmol/L (ref 22–32)
Calcium: 9.6 mg/dL (ref 8.9–10.3)
Chloride: 103 mmol/L (ref 98–111)
Creatinine, Ser: 0.72 mg/dL (ref 0.61–1.24)
GFR, Estimated: >60 mL/min (ref >60)
Glucose, Bld: 113 mg/dL — ABNORMAL HIGH (ref 70–99)
Potassium: 4.1 mmol/L (ref 3.5–5.1)
Sodium: 137 mmol/L (ref 135–145)
Total Bilirubin: 4.5 mg/dL — ABNORMAL HIGH (ref 0.3–1.2)
Total Protein: 6.7 g/dL (ref 6.5–8.1)

## 2021-03-06 LAB — MAGNESIUM: Magnesium: 2.1 mg/dL (ref 1.7–2.4)

## 2021-03-06 SURGERY — ENDOSCOPIC RETROGRADE CHOLANGIOPANCREATOGRAPHY (ERCP) WITH PROPOFOL
Anesthesia: General

## 2021-03-06 MED ORDER — LACTATED RINGERS IV SOLN: INTRAVENOUS | Status: DC | PRN

## 2021-03-06 MED ORDER — PHENYLEPHRINE 40 MCG/ML (10ML) SYRINGE FOR IV PUSH (FOR BLOOD PRESSURE SUPPORT)
PREFILLED_SYRINGE | INTRAVENOUS | Status: DC | PRN
  Administered 2021-03-06: 160 ug via INTRAVENOUS

## 2021-03-06 MED ORDER — INDOMETHACIN 50 MG RE SUPP
RECTAL | Status: AC
  Filled 2021-03-06: qty 2

## 2021-03-06 MED ORDER — ASPIRIN EC 81 MG PO TBEC
81.0000 mg | DELAYED_RELEASE_TABLET | Freq: Every day | ORAL | Status: DC
  Administered 2021-03-06: 81 mg via ORAL
  Filled 2021-03-06: qty 1

## 2021-03-06 MED ORDER — SODIUM CHLORIDE 0.9 % IV SOLN: INTRAVENOUS | Status: DC

## 2021-03-06 MED ORDER — SUGAMMADEX SODIUM 500 MG/5ML IV SOLN
INTRAVENOUS | Status: DC | PRN
  Administered 2021-03-06: 400 mg via INTRAVENOUS

## 2021-03-06 MED ORDER — FENTANYL CITRATE (PF) 100 MCG/2ML IJ SOLN
INTRAMUSCULAR | Status: DC | PRN
  Administered 2021-03-06: 50 ug via INTRAVENOUS

## 2021-03-06 MED ORDER — DEXAMETHASONE SODIUM PHOSPHATE 10 MG/ML IJ SOLN
INTRAMUSCULAR | Status: DC | PRN
  Administered 2021-03-06: 10 mg via INTRAVENOUS

## 2021-03-06 MED ORDER — FENTANYL CITRATE (PF) 100 MCG/2ML IJ SOLN
INTRAMUSCULAR | Status: AC
  Filled 2021-03-06: qty 2

## 2021-03-06 MED ORDER — ONDANSETRON HCL 4 MG/2ML IJ SOLN
INTRAMUSCULAR | Status: DC | PRN
  Administered 2021-03-06: 4 mg via INTRAVENOUS

## 2021-03-06 MED ORDER — CIPROFLOXACIN IN D5W 400 MG/200ML IV SOLN
INTRAVENOUS | Status: AC
  Filled 2021-03-06: qty 200

## 2021-03-06 MED ORDER — SODIUM CHLORIDE 0.9 % IV SOLN: INTRAVENOUS | Status: DC | PRN

## 2021-03-06 MED ORDER — PROPOFOL 10 MG/ML IV BOLUS
INTRAVENOUS | Status: DC | PRN
  Administered 2021-03-06: 130 mg via INTRAVENOUS

## 2021-03-06 MED ORDER — PROPOFOL 10 MG/ML IV BOLUS
INTRAVENOUS | Status: AC
  Filled 2021-03-06: qty 20

## 2021-03-06 MED ORDER — INDOMETHACIN 50 MG RE SUPP: 100.0000 mg | Freq: Once | RECTAL | Status: DC

## 2021-03-06 MED ORDER — LIDOCAINE 2% (20 MG/ML) 5 ML SYRINGE
INTRAMUSCULAR | Status: DC | PRN
  Administered 2021-03-06: 40 mg via INTRAVENOUS

## 2021-03-06 MED ORDER — CHOLESTYRAMINE LIGHT 4 G PO PACK: 4.0000 g | PACK | Freq: Two times a day (BID) | ORAL | 0 refills | Status: DC | PRN

## 2021-03-06 MED ORDER — GLUCAGON HCL RDNA (DIAGNOSTIC) 1 MG IJ SOLR
INTRAMUSCULAR | Status: AC
  Filled 2021-03-06: qty 1

## 2021-03-06 MED ORDER — ROCURONIUM BROMIDE 10 MG/ML (PF) SYRINGE
PREFILLED_SYRINGE | INTRAVENOUS | Status: DC | PRN
  Administered 2021-03-06: 70 mg via INTRAVENOUS

## 2021-03-06 NOTE — Anesthesia Preprocedure Evaluation (Addendum)
Anesthesia Evaluation  Patient identified by MRN, date of birth, ID band Patient awake    Reviewed: Allergy & Precautions, NPO status , Patient's Chart, lab work & pertinent test results  Airway Mallampati: I  TM Distance: >3 FB Neck ROM: Full    Dental  (+) Dental Advisory Given, Chipped,    Pulmonary neg pulmonary ROS,    Pulmonary exam normal breath sounds clear to auscultation       Cardiovascular hypertension, Pt. on medications Normal cardiovascular exam Rhythm:Regular Rate:Normal     Neuro/Psych negative neurological ROS  negative psych ROS   GI/Hepatic negative GI ROS, (+) Hepatitis -  Endo/Other  negative endocrine ROS  Renal/GU negative Renal ROS  negative genitourinary   Musculoskeletal negative musculoskeletal ROS (+)   Abdominal   Peds  Hematology negative hematology ROS (+)   Anesthesia Other Findings   Reproductive/Obstetrics                            Anesthesia Physical Anesthesia Plan  ASA: 2  Anesthesia Plan: General   Post-op Pain Management:    Induction: Intravenous  PONV Risk Score and Plan: 2 and Dexamethasone, Ondansetron and Treatment may vary due to age or medical condition  Airway Management Planned: Oral ETT  Additional Equipment:   Intra-op Plan:   Post-operative Plan: Extubation in OR  Informed Consent: I have reviewed the patients History and Physical, chart, labs and discussed the procedure including the risks, benefits and alternatives for the proposed anesthesia with the patient or authorized representative who has indicated his/her understanding and acceptance.     Dental advisory given  Plan Discussed with: CRNA  Anesthesia Plan Comments:         Anesthesia Quick Evaluation

## 2021-03-06 NOTE — Anesthesia Postprocedure Evaluation (Signed)
Anesthesia Post Note  Patient: Stanley Romero  Procedure(s) Performed: ENDOSCOPIC RETROGRADE CHOLANGIOPANCREATOGRAPHY (ERCP) WITH PROPOFOL SPHINCTEROTOMY REMOVAL OF STONES     Patient location during evaluation: PACU Anesthesia Type: General Level of consciousness: awake and alert Pain management: pain level controlled Vital Signs Assessment: post-procedure vital signs reviewed and stable Respiratory status: spontaneous breathing, nonlabored ventilation, respiratory function stable and patient connected to nasal cannula oxygen Cardiovascular status: blood pressure returned to baseline and stable Postop Assessment: no apparent nausea or vomiting Anesthetic complications: no   No notable events documented.  Last Vitals:  Vitals:   03/06/21 1420 03/06/21 1510  BP: (!) 146/66 (!) 150/79  Pulse: 60 61  Resp: 11 16  Temp:  (!) 36.4 C  SpO2: 97% 97%    Last Pain:  Vitals:   03/06/21 1510  TempSrc: Oral  PainSc:                  Codi Kertz L Oracio Galen

## 2021-03-06 NOTE — Op Note (Signed)
[facility] Patient Name: Stanley Romero Procedure Date: 03/06/2021 MRN: [id number] Attending MD: Ronnette Juniper , MD Date of Birth: 1946/03/20 CSN: [id number] Age: 75 Admit Type: Inpatient Procedure:                ERCP Indications:              Common bile duct stone(s), Abdominal pain of                            suspected biliary origin, Abnormal MRCP, Jaundice,                            Elevated liver enzymes Providers:                Ronnette Juniper, MD, Doristine Johns, RN, Elmer Ramp.                            Tilden Dome, RN, Eliberto Ivory Referring MD:             [facility] Medicines:                Monitored Anesthesia Care Complications:            No immediate complications. Estimated blood loss:                            Minimal. Estimated Blood Loss:     Estimated blood loss was minimal. Procedure:                Pre-Anesthesia Assessment:                           - Prior to the procedure, a History and Physical                            was performed, and patient medications and                            allergies were reviewed. The patient's tolerance of                            previous anesthesia was also reviewed. The risks                            and benefits of the procedure and the sedation                            options and risks were discussed with the patient.                            All questions were answered, and informed consent                            was obtained. Prior Anticoagulants: The patient has                            taken no  previous anticoagulant or antiplatelet                            agents except for aspirin. ASA Grade Assessment: II                            - A patient with mild systemic disease. After                            reviewing the risks and benefits, the patient was                            deemed in satisfactory condition to undergo the                            procedure.                            After obtaining informed consent, the scope was                            passed under direct vision. Throughout the                            procedure, the patient's blood pressure, pulse, and                            oxygen saturations were monitored continuously. The                            TJF-Q180V ([id number]) Olympus duodenoscope was                            introduced through the mouth, and used to inject                            contrast into and used to inject contrast into the                            bile duct. The ERCP was accomplished without                            difficulty. The patient tolerated the procedure                            well. Scope In: Scope Out: Findings:      A scout film of the abdomen was obtained. Surgical clips, consistent       with a previous cholecystectomy, were seen in the area of the right       upper quadrant of the abdomen. The esophagus was successfully intubated       under direct vision. The scope was advanced to a normal major papilla in       the descending duodenum without detailed examination of the pharynx,       larynx and  associated structures, and upper GI tract. The upper GI tract       was grossly normal.      The stomach was "J" shaped and abdominal pressure was applied to advance       the scope into the duodenum.      The ampulla was noted near a diverticulum in the 2nd portion of duodenum.      The bile duct was readily and deeply cannulated with the sphincterotome       on the first attempt. Contrast was injected. I personally interpreted       the bile duct images. There was brisk flow of contrast through the       ducts. Image quality was excellent. Contrast extended to the main bile       duct. The lower third of the main bile duct contained two stones, the       largest of which was 6 mm in diameter. The main bile duct was moderately       dilated. The largest diameter was 10 mm. A  cholecystectomy had been       performed. A straight Roadrunner wire was passed into the biliary tree.       A 9 mm biliary sphincterotomy was made with a braided sphincterotome       using ERBE electrocautery. There was no post-sphincterotomy bleeding.       The biliary tree was swept with a 12 mm balloon starting at the       bifurcation. Two stones were removed. No stones remained.      The pancreatic duct was not canulated or injected. Impression:               - The entire main bile duct was moderately dilated.                           - The patient has had a cholecystectomy.                           - Choledocholithiasis was found. Complete removal                            was accomplished by biliary sphincterotomy and                            balloon extraction.                           - A biliary sphincterotomy was performed.                           - The biliary tree was swept. Moderate Sedation:      Patient did not receive moderate sedation for this procedure, but       instead received monitored anesthesia care. Recommendation:           - Advance diet as tolerated.                           - If patient remains pain free and able to tolerate  diet ok to d/c home toyda evening. Procedure Code(s):        --- Professional ---                           [phone], Endoscopic retrograde                            cholangiopancreatography (ERCP); with removal of                            calculi/debris from biliary/pancreatic duct(s)                           43262, Endoscopic retrograde                            cholangiopancreatography (ERCP); with                            sphincterotomy/papillotomy Diagnosis Code(s):        --- Professional ---                           Z90.49, Acquired absence of other specified parts                            of digestive tract                           K80.50, Calculus of bile duct without cholangitis                             or cholecystitis without obstruction                           R10.9, Unspecified abdominal pain                           R17, Unspecified jaundice                           R74.8, Abnormal levels of other serum enzymes                           K83.8, Other specified diseases of biliary tract                           R93.2, Abnormal findings on diagnostic imaging of                            liver and biliary tract CPT copyright 2019 American Medical Association. All rights reserved. The codes documented in this report are preliminary and upon coder review may  be revised to meet current compliance requirements. Ronnette Juniper, MD 03/06/2021 1:56:56 PM This report has been signed electronically. Number of Addenda: 0

## 2021-03-06 NOTE — Interval H&P Note (Signed)
History and Physical Interval Note: 75/male with CBD stones, abnormal LFTs and imaging, fever, abdominal pain and weight loss for an ERCP.  03/06/2021 12:25 PM  Stanley Romero  has presented today for ERCP, with the diagnosis of Jaundice, AB pain, elevated LFTs/alk phos outpatient, MRCP 12/2020 with CBD stone.  The various methods of treatment have been discussed with the patient and family. After consideration of risks, benefits and other options for treatment, the patient has consented to  Procedure(s): ENDOSCOPIC RETROGRADE CHOLANGIOPANCREATOGRAPHY (ERCP) WITH PROPOFOL (N/A) as a surgical intervention.  The patient's history has been reviewed, patient examined, no change in status, stable for surgery.  I have reviewed the patient's chart and labs.  Questions were answered to the patient's satisfaction.     Ronnette Juniper

## 2021-03-06 NOTE — Progress Notes (Signed)
Initial Nutrition Assessment  INTERVENTION:   Once diet advanced: -Ensure MAX Protein po BID, each supplement provides 150 kcal and 30 grams of protein  -Multivitamin with minerals daily  NUTRITION DIAGNOSIS:   Inadequate oral intake related to poor appetite as evidenced by per patient/family report.  GOAL:   Patient will meet greater than or equal to 90% of their needs  MONITOR:   PO intake, Supplement acceptance, Diet advancement, Labs, Weight trends, I & O's  REASON FOR ASSESSMENT:   Malnutrition Screening Tool    ASSESSMENT:   75 year old gentleman history of hypertension, hyperlipidemia, history of kidney stones, status postcholecystectomy March 2022 presented to the ED with complaints of progressive intermittent gallbladder attacks which he describes as fever, chills, nausea, vomiting, epigastric abdominal pain, extreme pruritus, jaundice which have become more frequent.  MRI abdomen done 01/04/2021 with mild diffuse biliary ductal dilatation due to choledocholithiasis.  Patient seen in the GI office labs obtained with elevated bilirubin, transaminitis.  Patient admitted for further evaluation and ERCP to be done 03/06/2021.  Patient placed empirically on IV Zosyn  Patient in room with family at bedside. Pt awaiting ERCP today so NPO. Pt states he has had poor appetite since his gallbladder surgery in March 2022. Most recently intakes were worse given N/V. Last time he ate something solid was 11/9 that night (ate a ham sandwich). States he will drink Muscle milk after workouts. Agreeable to having protein shakes once diet advanced.  Pt reports ~20 lbs of weight loss. Feels it is muscle loss as he is pretty active and has noticed his workouts have felt more difficult lately. Per weight records, pt has lost 17 lbs since March 2022 (9% wt loss x 8 months, insignificant for time frame).   Medications: Vitamin C, Vitamin D, Zinc sulfate  Labs reviewed.  NUTRITION - FOCUSED  PHYSICAL EXAM:  Visit interrupted as pt was taken to endoscopy.  Diet Order:   Diet Order             Diet NPO time specified  Diet effective now                   EDUCATION NEEDS:   No education needs have been identified at this time  Skin:  Skin Assessment: Reviewed RN Assessment  Last BM:  11/7  Height:   Ht Readings from Last 1 Encounters:  03/06/21 5\' 8"  (1.727 m)    Weight:   Wt Readings from Last 1 Encounters:  03/06/21 75 kg    BMI:  Body mass index is 25.14 kg/m.  Estimated Nutritional Needs:   Kcal:  1900-2100  Protein:  90-105g  Fluid:  2L/day   Clayton Bibles, MS, RD, LDN Inpatient Clinical Dietitian Contact information available via Amion

## 2021-03-06 NOTE — Progress Notes (Signed)
PROGRESS NOTE    LARZ MARK  PYY:[id number] DOB: 1945/11/13 DOA: 03/05/2021 PCP: Maury Dus, MD   Chief Complaint  Patient presents with   Abdominal Pain   Nausea   Emesis   itching    Brief Narrative:  Patient pleasant 75 year old gentleman history of hypertension, hyperlipidemia, history of kidney stones, status postcholecystectomy March 2022 presented to the ED with complaints of progressive intermittent gallbladder attacks which he describes as fever, chills, nausea, vomiting, epigastric abdominal pain, extreme pruritus, jaundice which have become more frequent.  MRI abdomen done 01/04/2021 with mild diffuse biliary ductal dilatation due to choledocholithiasis.  Patient seen in the GI office labs obtained with elevated bilirubin, transaminitis.  Patient admitted for further evaluation and ERCP to be done 03/06/2021.  Patient placed empirically on IV Zosyn   Assessment & Plan:   Principal Problem:   Choledocholithiasis Active Problems:   Cholestatic pruritus   Hypertension   Hypercholesterolemia   Diverticulitis  #1 choledocholithiasis status postcholecystectomy/cholestatic pruritus -Patient presenting with progressive worsening episodes of epigastric abdominal pain, extreme pruritus, jaundice, fever, chills, nausea, vomiting, 22 pound weight loss over 6 months.. -Patient noted to have abnormal LFTs with elevated alk phosphatase at 580, elevated AST 72 and ALT 91 and elevated bilirubin 5.8. -Patient noted to have had an MRCP done 01/04/2021 which showed stable tiny cystic lesions in the pancreatic head and tail, prior cholecystectomy, new mild diffuse biliary ductal dilatation due to choledocholithiasis. -Patient seen in consultation by GI who are recommended admission for ERCP to be done today. -LFTs trending down. -Some clinical improvement. -Cholestyramine increased to twice daily to help with pruritus which patient states it is. -Continue empiric IV Zosyn. -Per  GI.  2.  Hypertension -Continue Avapro.  3.  BPH/urinary frequency -Continue home regimen Ditropan.  4.  Hyperlipidemia -Continue to hold statin.   DVT prophylaxis: SCDs Code Status: Full Family Communication: Updated patient.  No family at bedside. Disposition:   Status is: Observation  The patient remains OBS appropriate and will d/c before 2 midnights.      Consultants:  GI: Dr. Therisa Doyne 03/05/2021  Procedures:  ERCP pending 03/06/2021  Antimicrobials:  IV Zosyn 03/05/2021>>>>>>>   Subjective: Laying in bed on the telephone.  Denies any chest pain.  No shortness of breath.  Denies any epigastric pain.  No nausea or emesis.  Overall feeling better than on admission.  Patient stating cholestyramine helped significantly with pruritus.  Objective: Vitals:   03/06/21 0042 03/06/21 0500 03/06/21 0508 03/06/21 0909  BP: 129/75  136/75 129/80  Pulse: 70  (!) 57 64  Resp: 18  18 16   Temp: (!) 97.5 F (36.4 C)  97.8 F (36.6 C) (!) 97.5 F (36.4 C)  TempSrc: Oral   Oral  SpO2: 95%  97% 97%  Weight:  87.2 kg    Height:        Intake/Output Summary (Last 24 hours) at 03/06/2021 0912 Last data filed at 03/06/2021 0500 Gross per 24 hour  Intake 616.51 ml  Output 2 ml  Net 614.51 ml   Filed Weights   03/05/21 2110 03/06/21 0500  Weight: 86.9 kg 87.2 kg    Examination:  General exam: Appears calm and comfortable.  Slightly jaundiced. Respiratory system: Clear to auscultation. Respiratory effort normal. Cardiovascular system: S1 & S2 heard, RRR. No JVD, murmurs, rubs, gallops or clicks. No pedal edema. Gastrointestinal system: Abdomen is nondistended, soft and nontender. No organomegaly or masses felt. Normal bowel sounds heard. Central nervous system: Alert and  oriented. No focal neurological deficits. Extremities: Symmetric 5 x 5 power. Skin: No rashes, lesions or ulcers Psychiatry: Judgement and insight appear normal. Mood & affect appropriate.     Data  Reviewed: I have personally reviewed following labs and imaging studies  CBC: Recent Labs  Lab 03/05/21 1512 03/06/21 0456  WBC 5.8 4.6  NEUTROABS 3.9 2.5  HGB 14.9 14.3  HCT 45.5 43.4  MCV 99.3 99.3  PLT 204 062    Basic Metabolic Panel: Recent Labs  Lab 03/05/21 1512 03/06/21 0456  NA 135 137  K 3.9 4.1  CL 101 103  CO2 26 25  GLUCOSE 113* 113*  BUN 12 12  CREATININE 0.72 0.72  CALCIUM 9.8 9.6  MG  --  2.1    GFR: Estimated Creatinine Clearance: 85.7 mL/min (by C-G formula based on SCr of 0.72 mg/dL).  Liver Function Tests: Recent Labs  Lab 03/05/21 1512 03/06/21 0456  AST 81* 66*  ALT 101* 81*  ALKPHOS 554* 492*  BILITOT 5.7* 4.5*  PROT 7.4 6.7  ALBUMIN 3.8 3.2*    CBG: No results for input(s): GLUCAP in the last 168 hours.   Recent Results (from the past 240 hour(s))  Resp Panel by RT-PCR (Flu A&B, Covid) Nasopharyngeal Swab     Status: None   Collection Time: 03/05/21  5:56 PM   Specimen: Nasopharyngeal Swab; Nasopharyngeal(NP) swabs in vial transport medium  Result Value Ref Range Status   SARS Coronavirus 2 by RT PCR NEGATIVE NEGATIVE Final    Comment: (NOTE) SARS-CoV-2 target nucleic acids are NOT DETECTED.  The SARS-CoV-2 RNA is generally detectable in upper respiratory specimens during the acute phase of infection. The lowest concentration of SARS-CoV-2 viral copies this assay can detect is 138 copies/mL. A negative result does not preclude SARS-Cov-2 infection and should not be used as the sole basis for treatment or other patient management decisions. A negative result may occur with  improper specimen collection/handling, submission of specimen other than nasopharyngeal swab, presence of viral mutation(s) within the areas targeted by this assay, and inadequate number of viral copies(<138 copies/mL). A negative result must be combined with clinical observations, patient history, and epidemiological information. The expected result is  Negative.  Fact Sheet for Patients:  EntrepreneurPulse.com.au  Fact Sheet for Healthcare Providers:  IncredibleEmployment.be  This test is no t yet approved or cleared by the [place] FDA and  has been authorized for detection and/or diagnosis of SARS-CoV-2 by FDA under an Emergency Use Authorization (EUA). This EUA will remain  in effect (meaning this test can be used) for the duration of the COVID-19 declaration under Section 564(b)(1) of the Act, 21 U.S.C.section 360bbb-3(b)(1), unless the authorization is terminated  or revoked sooner.       Influenza A by PCR NEGATIVE NEGATIVE Final   Influenza B by PCR NEGATIVE NEGATIVE Final    Comment: (NOTE) The Xpert Xpress SARS-CoV-2/FLU/RSV plus assay is intended as an aid in the diagnosis of influenza from Nasopharyngeal swab specimens and should not be used as a sole basis for treatment. Nasal washings and aspirates are unacceptable for Xpert Xpress SARS-CoV-2/FLU/RSV testing.  Fact Sheet for Patients: EntrepreneurPulse.com.au  Fact Sheet for Healthcare Providers: IncredibleEmployment.be  This test is not yet approved or cleared by the [place] FDA and has been authorized for detection and/or diagnosis of SARS-CoV-2 by FDA under an Emergency Use Authorization (EUA). This EUA will remain in effect (meaning this test can be used) for the duration of the COVID-19 declaration  under Section 564(b)(1) of the Act, 21 U.S.C. section 360bbb-3(b)(1), unless the authorization is terminated or revoked.  Performed at [facility], [place] [address], [place], [place] [address]          Radiology Studies: No results found.      Scheduled Meds:  vitamin C  500 mg Oral Daily   aspirin EC  81 mg Oral Daily   cholecalciferol  2,000 Units Oral Daily   cholestyramine light  4 g Oral BID   dorzolamide-timolol  1 drop Both Eyes BID    feeding supplement  1 Container Oral TID BM   irbesartan  300 mg Oral Daily   oxybutynin  5 mg Oral TID   polyvinyl alcohol  1 drop Both Eyes BID   sodium chloride flush  3 mL Intravenous Q12H   zinc sulfate  220 mg Oral BID   Continuous Infusions:  sodium chloride 100 mL/hr at 03/06/21 0443   piperacillin-tazobactam 3.375 g (03/06/21 0648)     LOS: 0 days    Time spent: 35 minutes    Irine Seal, MD [facility]   To contact the attending provider between 7A-7P or the covering provider during after hours 7P-7A, please log into the web site [email or web address] and access using universal [place] password for that web site. If you do not have the password, please call the hospital operator.  03/06/2021, 9:12 AM

## 2021-03-06 NOTE — Discharge Summary (Signed)
Physician Discharge Summary  [name]:[id number] DOB: [birth year] DOA: 03/05/2021  PCP: Maury Dus, MD  Admit date: 03/05/2021 Discharge date: 03/06/2021  Time spent: 50 minutes  Recommendations for Outpatient Follow-up:  Follow-up with Dr. Therisa Doyne, gastroenterology in 3 weeks.  On follow-up patient will need a comprehensive metabolic profile done to follow-up on electrolytes, renal function, LFTs.   Discharge Diagnoses:  Principal Problem:   Choledocholithiasis Active Problems:   Cholestatic pruritus   Hypertension   Hypercholesterolemia   Diverticulitis   Discharge Condition: Stable and improved.  Diet recommendation: Soft diet  Filed Weights   03/05/21 2110 03/06/21 0500 03/06/21 1154  Weight: 86.9 kg 87.2 kg 75 kg    History of present illness:   Stanley Romero is a 75 y.o. male with medical history significant of hypertension, hyperlipidemia, history of kidney stones, cholecystectomy March 2022 presenting to the ED with complaints of intermittent gallbladder attacks as he describes it. Patient states ever since his cholecystectomy March 2022 he has been having what he describes as gallbladder attacks monthly which have progressed from monthly to every 2 weeks and then subsequently to weekly with last attack 2 days prior to admission.  Patient describes his attacks as fevers with temperatures as high as 103, chills, nausea, nonbloody emesis, epigastric abdominal pain, jaundice, extreme itching and states symptoms last for about 8 to 10 hours and subsequently resolved.  Patient does endorse some lightheadedness during these episodes.  With elevated blood pressure.  Patient also endorses a 22 pound weight loss over the past 6 months.  Patient denies any chest pain, no shortness of breath, no diarrhea, no constipation, no syncope, no melena, no hematemesis, no hematochezia.  Patient states went to gastroenterologist office today for blood work and was called back to  present to the ED for further evaluation and to be set up for ERCP to be done in house tomorrow 03/06/2021.   ED Course: Patient seen in the ED vital signs stable.  Labs obtained with comprehensive metabolic profile with a glucose of 113, alk phosphatase of 554, AST 81, ALT 101, bilirubin 5.7 otherwise was within normal limits.  CBC unremarkable.  COVID-19 PCR and influenza PCR pending.  Urinalysis done nitrite negative leukocytes negative protein negative bilirubin negative.  Patient seen in consultation by GI who requested medical admission for further evaluation and management    Hospital Course:  #1 choledocholithiasis status postcholecystectomy/cholestatic pruritus -Patient presented with progressive worsening episodes of epigastric abdominal pain, extreme pruritus, jaundice, fever, chills, nausea, vomiting, 22 pound weight loss over 6 months.. -Patient noted to have abnormal LFTs with elevated alk phosphatase at 580, elevated AST 72 and ALT 91 and elevated bilirubin 5.8. -Patient noted to have had an MRCP done 01/04/2021 which showed stable tiny cystic lesions in the pancreatic head and tail, prior cholecystectomy, new mild diffuse biliary ductal dilatation due to choledocholithiasis. -Patient seen in consultation by GI who are recommended admission for ERCP. -Patient placed empirically on IV Zosyn, LFTs trended down. -Patient also placed on cholestyramine twice daily which help with pruritus. -Patient improved clinically. -Patient subsequently underwent ERCP 03/06/2021 which showed moderately dilated entire main bile duct, choledocholithiasis found, complete removal was accomplished by biliary sphincterectomy and balloon extraction, biliary tree swept. -Patient placed on a full liquid diet which he tolerated. -Patient cleared by GI for discharge home. -Patient was discharged home in stable improved condition and is to follow-up with GI 3 weeks post discharge.  2.  Hypertension -Patient  maintained on Avapro during  the hospitalization.  3.  BPH/urinary frequency -Patient resume back on home regimen Ditropan.  4.  Hyperlipidemia -Statin held during the hospitalization but resumed on discharge.  Procedures: ERCP 03/06/2021  Consultations: GI: Dr. Therisa Doyne 03/05/2021    Discharge Exam: Vitals:   03/06/21 1420 03/06/21 1510  BP: (!) 146/66 (!) 150/79  Pulse: 60 61  Resp: 11 16  Temp:  (!) 97.5 F (36.4 C)  SpO2: 97% 97%    General: NAD Cardiovascular: RRR no murmurs rubs or gallops.  No JVD.  No lower extremity edema. Respiratory: CTA B.  No wheezes, no crackles, no rhonchi.  Normal respiratory effort  Discharge Instructions   Discharge Instructions     Diet - low sodium heart healthy   Complete by: As directed    Soft diet   Increase activity slowly   Complete by: As directed       Allergies as of 03/06/2021       Reactions   Bee Venom Anaphylaxis   Wasp Venom Anaphylaxis   Other Other (See Comments)   Mold No known reaction.    Pollen Extract Other (See Comments)   No known reaction.         Medication List     TAKE these medications    acetaminophen 325 MG tablet Commonly known as: TYLENOL Take 650 mg by mouth every 6 (six) hours as needed for moderate pain.   aspirin EC 81 MG tablet Take 81 mg by mouth daily. Swallow whole.   atorvastatin 40 MG tablet Commonly known as: LIPITOR Take 40 mg by mouth daily.   b complex vitamins capsule Take 1 capsule by mouth daily.   cholestyramine light 4 g packet Commonly known as: PREVALITE Take 1 packet (4 g total) by mouth 2 (two) times daily as needed.   diphenhydrAMINE 25 MG tablet Commonly known as: BENADRYL Take 25 mg by mouth 2 (two) times daily as needed for allergies.   dorzolamide-timolol 22.3-6.8 MG/ML ophthalmic solution Commonly known as: COSOPT Place 1 drop into both eyes 2 (two) times daily.   EPINEPHrine 0.3 mg/0.3 mL Soaj injection Commonly known as:  EPI-PEN Inject 0.3 mg into the muscle as needed for anaphylaxis.   hydrocortisone cream 1 % Apply 1 application topically 2 (two) times daily as needed for itching.   LUBRICATING EYE DROPS OP Place 1 drop into both eyes 2 (two) times daily.   MAGNESIUM PO Take 1 tablet by mouth daily.   oxybutynin 5 MG tablet Commonly known as: DITROPAN Take 5 mg by mouth 3 (three) times daily.   telmisartan 80 MG tablet Commonly known as: MICARDIS Take 80 mg by mouth daily.   Turmeric 500 MG Caps Take 500 mg by mouth 2 (two) times daily.   vitamin C 500 MG tablet Commonly known as: ASCORBIC ACID Take 500 mg by mouth daily.   Vitamin D 50 MCG (2000 UT) tablet Take 2,000 Units by mouth daily.   vitamin E 180 MG (400 UNITS) capsule Take 400 Units by mouth daily.   zinc gluconate 50 MG tablet Take 50 mg by mouth 2 (two) times daily.   zolpidem 10 MG tablet Commonly known as: AMBIEN Take 10 mg by mouth at bedtime as needed for sleep.       Allergies  Allergen Reactions   Bee Venom Anaphylaxis   Wasp Venom Anaphylaxis   Other Other (See Comments)    Mold  No known reaction.    Pollen Extract Other (See Comments)  No known reaction.     Follow-up Information     Ronnette Juniper, MD. Schedule an appointment as soon as possible for a visit in 3 week(s).   Specialty: Gastroenterology Contact information: [place] [address] [phone]                  The results of significant diagnostics from this hospitalization (including imaging, microbiology, ancillary and laboratory) are listed below for reference.    Significant Diagnostic Studies: DG ERCP  Result Date: 03/06/2021 CLINICAL DATA:  Choledocholithiasis. EXAM: ERCP TECHNIQUE: Multiple spot images obtained with the fluoroscopic device and submitted for interpretation post-procedure. COMPARISON:  MRCP-01/04/2021 FLUOROSCOPY TIME:  2 minutes, 32 seconds (thirty-one mGy) FINDINGS: Two  spot intraoperative fluoroscopic images of the right upper abdominal quadrant during ERCP are provided for review. Initial image demonstrates an ERCP probe overlying the right upper abdominal quadrant. Cholecystectomy clips overlie the expected location of the gallbladder fossa. There is selective cannulation and opacification of common bile duct which appears moderately dilated. There is an apparent nonocclusive filling defect at the level of the biliary hilum. No definitive opacification of the pancreatic or cystic duct. IMPRESSION: ERCP as above. These images were submitted for radiologic interpretation only. Please see the procedural report for the amount of contrast and the fluoroscopy time utilized. Electronically Signed   By: Sandi Mariscal M.D.   On: 03/06/2021 14:16    Microbiology: Recent Results (from the past 240 hour(s))  Resp Panel by RT-PCR (Flu A&B, Covid) Nasopharyngeal Swab     Status: None   Collection Time: 03/05/21  5:56 PM   Specimen: Nasopharyngeal Swab; Nasopharyngeal(NP) swabs in vial transport medium  Result Value Ref Range Status   SARS Coronavirus 2 by RT PCR NEGATIVE NEGATIVE Final    Comment: (NOTE) SARS-CoV-2 target nucleic acids are NOT DETECTED.  The SARS-CoV-2 RNA is generally detectable in upper respiratory specimens during the acute phase of infection. The lowest concentration of SARS-CoV-2 viral copies this assay can detect is 138 copies/mL. A negative result does not preclude SARS-Cov-2 infection and should not be used as the sole basis for treatment or other patient management decisions. A negative result may occur with  improper specimen collection/handling, submission of specimen other than nasopharyngeal swab, presence of viral mutation(s) within the areas targeted by this assay, and inadequate number of viral copies(<138 copies/mL). A negative result must be combined with clinical observations, patient history, and epidemiological information. The  expected result is Negative.  Fact Sheet for Patients:  EntrepreneurPulse.com.au  Fact Sheet for Healthcare Providers:  IncredibleEmployment.be  This test is no t yet approved or cleared by the [place] FDA and  has been authorized for detection and/or diagnosis of SARS-CoV-2 by FDA under an Emergency Use Authorization (EUA). This EUA will remain  in effect (meaning this test can be used) for the duration of the COVID-19 declaration under Section 564(b)(1) of the Act, 21 U.S.C.section 360bbb-3(b)(1), unless the authorization is terminated  or revoked sooner.       Influenza A by PCR NEGATIVE NEGATIVE Final   Influenza B by PCR NEGATIVE NEGATIVE Final    Comment: (NOTE) The Xpert Xpress SARS-CoV-2/FLU/RSV plus assay is intended as an aid in the diagnosis of influenza from Nasopharyngeal swab specimens and should not be used as a sole basis for treatment. Nasal washings and aspirates are unacceptable for Xpert Xpress SARS-CoV-2/FLU/RSV testing.  Fact Sheet for Patients: EntrepreneurPulse.com.au  Fact Sheet for Healthcare Providers: IncredibleEmployment.be  This  test is not yet approved or cleared by the [place] and has been authorized for detection and/or diagnosis of SARS-CoV-2 by FDA under an Emergency Use Authorization (EUA). This EUA will remain in effect (meaning this test can be used) for the duration of the COVID-19 declaration under Section 564(b)(1) of the Act, 21 U.S.C. section 360bbb-3(b)(1), unless the authorization is terminated or revoked.  Performed at [facility], [place] [address], [place], [place] [address]      Labs: Basic Metabolic Panel: Recent Labs  Lab 03/05/21 1512 03/06/21 0456  NA 135 137  K 3.9 4.1  CL 101 103  CO2 26 25  GLUCOSE 113* 113*  BUN 12 12  CREATININE 0.72 0.72  CALCIUM 9.8 9.6  MG  --  2.1   Liver Function  Tests: Recent Labs  Lab 03/05/21 1512 03/06/21 0456  AST 81* 66*  ALT 101* 81*  ALKPHOS 554* 492*  BILITOT 5.7* 4.5*  PROT 7.4 6.7  ALBUMIN 3.8 3.2*   Recent Labs  Lab 03/05/21 1512  LIPASE 38   No results for input(s): AMMONIA in the last 168 hours. CBC: Recent Labs  Lab 03/05/21 1512 03/06/21 0456  WBC 5.8 4.6  NEUTROABS 3.9 2.5  HGB 14.9 14.3  HCT 45.5 43.4  MCV 99.3 99.3  PLT 204 198   Cardiac Enzymes: No results for input(s): CKTOTAL, CKMB, CKMBINDEX, TROPONINI in the last 168 hours. BNP: BNP (last 3 results) No results for input(s): BNP in the last 8760 hours.  ProBNP (last 3 results) No results for input(s): PROBNP in the last 8760 hours.  CBG: No results for input(s): GLUCAP in the last 168 hours.     Signed:  Irine Seal MD.  [facility] 03/06/2021, 5:31 PM

## 2021-03-06 NOTE — Transfer of Care (Signed)
Immediate Anesthesia Transfer of Care Note  Patient: Stanley Romero  Procedure(s) Performed: Procedure(s): ENDOSCOPIC RETROGRADE CHOLANGIOPANCREATOGRAPHY (ERCP) WITH PROPOFOL (N/A) SPHINCTEROTOMY REMOVAL OF STONES  Patient Location: PACU  Anesthesia Type:General  Level of Consciousness: Alert, Awake, Oriented  Airway & Oxygen Therapy: Patient Spontanous Breathing  Post-op Assessment: Report given to RN  Post vital signs: Reviewed and stable  Last Vitals:  Vitals:   03/06/21 0909 03/06/21 1154  BP: 129/80 (!) 152/83  Pulse: 64 62  Resp: 16 14  Temp: (!) 36.4 C 36.5 C  SpO2: 30% 17%    Complications: No apparent anesthesia complications

## 2021-03-06 NOTE — Care Management CC44 (Signed)
Condition Code 44 Documentation Completed  Patient Details  Name: Stanley Romero MRN: [id number] Date of Birth: [birth year]   Condition Code 44 given:  Yes Patient signature on Condition Code 44 notice:  Yes Documentation of 2 MD's agreement:  Yes Code 44 added to claim:  Yes    Illene Regulus, LCSW 03/06/2021, 6:32 PM

## 2021-03-06 NOTE — Anesthesia Procedure Notes (Signed)
Procedure Name: Intubation Date/Time: 03/06/2021 1:17 PM Performed by: Gerald Leitz, CRNA Pre-anesthesia Checklist: Patient identified, Patient being monitored, Timeout performed, Emergency Drugs available and Suction available Patient Re-evaluated:Patient Re-evaluated prior to induction Oxygen Delivery Method: Circle system utilized Preoxygenation: Pre-oxygenation with 100% oxygen Induction Type: IV induction Ventilation: Mask ventilation without difficulty Laryngoscope Size: Mac and 3 Grade View: Grade III Tube type: Oral Tube size: 7.0 mm Number of attempts: 2 Airway Equipment and Method: Stylet Placement Confirmation: positive ETCO2 and breath sounds checked- equal and bilateral Secured at: 21 cm Tube secured with: Tape Dental Injury: Teeth and Oropharynx as per pre-operative assessment  Difficulty Due To: Difficulty was unanticipated and Difficult Airway- due to anterior larynx Comments: MAC 4 needed

## 2021-03-06 NOTE — Care Management Important Message (Signed)
Important Message  Patient Details  Name: EWIN REHBERG MRN: [id number] Date of Birth: [birth year]   Medicare Important Message Given:  Yes     Illene Regulus, LCSW 03/06/2021, 6:31 PM

## 2021-03-09 ENCOUNTER — Encounter (HOSPITAL_COMMUNITY): Payer: Self-pay | Admitting: Gastroenterology

## 2021-03-09 DIAGNOSIS — L299 Pruritus, unspecified: Secondary | ICD-10-CM | POA: Diagnosis not present

## 2021-03-09 DIAGNOSIS — Z1211 Encounter for screening for malignant neoplasm of colon: Secondary | ICD-10-CM | POA: Diagnosis not present

## 2021-03-09 DIAGNOSIS — R7989 Other specified abnormal findings of blood chemistry: Secondary | ICD-10-CM | POA: Diagnosis not present

## 2021-03-09 DIAGNOSIS — K8050 Calculus of bile duct without cholangitis or cholecystitis without obstruction: Secondary | ICD-10-CM | POA: Diagnosis not present

## 2021-03-09 DIAGNOSIS — R945 Abnormal results of liver function studies: Secondary | ICD-10-CM | POA: Diagnosis not present

## 2021-03-11 ENCOUNTER — Ambulatory Visit (HOSPITAL_COMMUNITY): Admit: 2021-03-11 | Payer: Medicare Other | Admitting: Gastroenterology

## 2021-03-11 ENCOUNTER — Encounter (HOSPITAL_COMMUNITY): Payer: Self-pay

## 2021-03-11 SURGERY — ERCP, WITH INTERVENTION IF INDICATED
Anesthesia: Monitor Anesthesia Care | Laterality: Bilateral

## 2021-03-13 ENCOUNTER — Other Ambulatory Visit: Payer: Self-pay

## 2021-03-13 ENCOUNTER — Ambulatory Visit (INDEPENDENT_AMBULATORY_CARE_PROVIDER_SITE_OTHER): Payer: Medicare Other

## 2021-03-13 DIAGNOSIS — T63481D Toxic effect of venom of other arthropod, accidental (unintentional), subsequent encounter: Secondary | ICD-10-CM | POA: Diagnosis not present

## 2021-03-13 DIAGNOSIS — T782XXD Anaphylactic shock, unspecified, subsequent encounter: Secondary | ICD-10-CM | POA: Diagnosis not present

## 2021-03-14 ENCOUNTER — Encounter: Payer: Self-pay | Admitting: Allergy

## 2021-03-16 DIAGNOSIS — R197 Diarrhea, unspecified: Secondary | ICD-10-CM | POA: Diagnosis not present

## 2021-03-17 DIAGNOSIS — L814 Other melanin hyperpigmentation: Secondary | ICD-10-CM | POA: Diagnosis not present

## 2021-03-17 DIAGNOSIS — D1801 Hemangioma of skin and subcutaneous tissue: Secondary | ICD-10-CM | POA: Diagnosis not present

## 2021-03-17 DIAGNOSIS — L821 Other seborrheic keratosis: Secondary | ICD-10-CM | POA: Diagnosis not present

## 2021-03-18 ENCOUNTER — Ambulatory Visit (INDEPENDENT_AMBULATORY_CARE_PROVIDER_SITE_OTHER): Payer: Medicare Other

## 2021-03-18 ENCOUNTER — Other Ambulatory Visit: Payer: Self-pay

## 2021-03-18 DIAGNOSIS — Z91038 Other insect allergy status: Secondary | ICD-10-CM

## 2021-03-18 DIAGNOSIS — R945 Abnormal results of liver function studies: Secondary | ICD-10-CM | POA: Diagnosis not present

## 2021-03-18 DIAGNOSIS — R7989 Other specified abnormal findings of blood chemistry: Secondary | ICD-10-CM | POA: Diagnosis not present

## 2021-03-23 ENCOUNTER — Ambulatory Visit (HOSPITAL_COMMUNITY): Admission: RE | Admit: 2021-03-23 | Payer: Medicare Other | Source: Home / Self Care | Admitting: Gastroenterology

## 2021-03-23 SURGERY — ERCP, WITH INTERVENTION IF INDICATED
Anesthesia: General | Laterality: Bilateral

## 2021-03-24 ENCOUNTER — Other Ambulatory Visit: Payer: Self-pay | Admitting: Gastroenterology

## 2021-03-24 DIAGNOSIS — H90A31 Mixed conductive and sensorineural hearing loss, unilateral, right ear with restricted hearing on the contralateral side: Secondary | ICD-10-CM | POA: Diagnosis not present

## 2021-03-24 DIAGNOSIS — H90A22 Sensorineural hearing loss, unilateral, left ear, with restricted hearing on the contralateral side: Secondary | ICD-10-CM | POA: Diagnosis not present

## 2021-03-24 DIAGNOSIS — R748 Abnormal levels of other serum enzymes: Secondary | ICD-10-CM

## 2021-03-25 ENCOUNTER — Other Ambulatory Visit: Payer: Self-pay | Admitting: Gastroenterology

## 2021-03-25 ENCOUNTER — Other Ambulatory Visit: Payer: Self-pay

## 2021-03-25 ENCOUNTER — Other Ambulatory Visit (HOSPITAL_COMMUNITY): Payer: Self-pay | Admitting: Gastroenterology

## 2021-03-25 ENCOUNTER — Ambulatory Visit
Admission: RE | Admit: 2021-03-25 | Discharge: 2021-03-25 | Disposition: A | Discharge status: Home / Self Care | Payer: Medicare Other | Source: Ambulatory Visit | Attending: Gastroenterology | Admitting: Gastroenterology

## 2021-03-25 ENCOUNTER — Ambulatory Visit (INDEPENDENT_AMBULATORY_CARE_PROVIDER_SITE_OTHER): Payer: Medicare Other

## 2021-03-25 DIAGNOSIS — R748 Abnormal levels of other serum enzymes: Secondary | ICD-10-CM

## 2021-03-25 DIAGNOSIS — Z91038 Other insect allergy status: Secondary | ICD-10-CM | POA: Diagnosis not present

## 2021-03-25 DIAGNOSIS — K838 Other specified diseases of biliary tract: Secondary | ICD-10-CM | POA: Diagnosis not present

## 2021-03-25 DIAGNOSIS — K8689 Other specified diseases of pancreas: Secondary | ICD-10-CM | POA: Diagnosis not present

## 2021-03-25 MED ORDER — GADOBENATE DIMEGLUMINE 529 MG/ML IV SOLN
14.0000 mL | Freq: Once | INTRAVENOUS | Status: AC | PRN
  Administered 2021-03-25: 14 mL via INTRAVENOUS

## 2021-03-26 ENCOUNTER — Telehealth: Payer: Self-pay

## 2021-03-26 NOTE — Telephone Encounter (Signed)
Possibly had a reaction after getting an allergy shot yesterday 03/25/21. Stated this is his 5th shot. Intense itching  after a wasp sting back in august. End of September had a spider bite with severe itching. During those times was still getting allergy venom shots. It does not get worse after or during the shots. For itching he was prescribed - hydroxyzine 25 mg 1 tablet every 6 hours and cholestyramine 4 grams 3 times a day. This was around the time he had bile surgery to remove gallstones.    Overall he is not having a reaction to the shot. He is not sure why he is still experiencing itching. The patient wanted to know "Could it be the injections or just him"?

## 2021-03-26 NOTE — Telephone Encounter (Signed)
Patient called to discuss with a nurse & Dr Maudie Mercury the reactions he is having after getting his allergy shots. Patient states each time he gets a shot since getting a wasp sting & spider bite in August he experiences itching all over each time.   Please advise

## 2021-03-26 NOTE — Telephone Encounter (Signed)
Please call patient back.  If the itching persists, then recommend making a follow up sooner to discuss.

## 2021-03-27 NOTE — Telephone Encounter (Signed)
Left detailed message to inform patient of Dr. Julianne Rice message and for patient to call us back

## 2021-03-30 ENCOUNTER — Encounter: Payer: Self-pay | Admitting: Allergy

## 2021-03-30 ENCOUNTER — Ambulatory Visit (INDEPENDENT_AMBULATORY_CARE_PROVIDER_SITE_OTHER): Payer: Medicare Other | Admitting: Allergy

## 2021-03-30 ENCOUNTER — Other Ambulatory Visit: Payer: Self-pay

## 2021-03-30 VITALS — BP 120/68 | HR 67 | Temp 97.6°F | Resp 18 | Ht 68.0 in | Wt 171.4 lb

## 2021-03-30 DIAGNOSIS — L299 Pruritus, unspecified: Secondary | ICD-10-CM | POA: Diagnosis not present

## 2021-03-30 DIAGNOSIS — T63481D Toxic effect of venom of other arthropod, accidental (unintentional), subsequent encounter: Secondary | ICD-10-CM

## 2021-03-30 DIAGNOSIS — T782XXD Anaphylactic shock, unspecified, subsequent encounter: Secondary | ICD-10-CM

## 2021-03-30 NOTE — Assessment & Plan Note (Signed)
.   See below for proper skin care.  . Continue with the hydroxyzine and cholestyramine medications as per your other doctor.  . Recheck CMP.
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
## 2021-03-30 NOTE — Patient Instructions (Addendum)
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 Continue allergy shots for now. If biopsy results don't show anything and bloodwork still shows elevated liver enzymes we will discuss stopping injections for a bit.   Make sure you have both pens with you at the same time. If you have to use the Epipen - then please call 911 or go to the ER.  For mild symptoms you can take over the counter antihistamines such as Benadryl and monitor symptoms closely. If symptoms worsen or if you have severe symptoms including breathing issues, throat closure, significant swelling, whole body hives, severe diarrhea and vomiting, lightheadedness then inject epinephrine and seek immediate medical care afterwards. Emergency action plan in place.   Itching: See below for proper skin care.  Continue with the hydroxyzine and cholestyramine medications as per your other doctor.   Follow up in 1 month or sooner if needed.  Skin care recommendations  Bath time: Always use lukewarm water. AVOID very hot or cold water. Keep bathing time to 5-10 minutes. Do NOT use bubble bath. Use a mild soap and use just enough to wash the dirty areas. Do NOT scrub skin vigorously.  After bathing, pat dry your skin with a towel. Do NOT rub or scrub the skin.  Moisturizers and prescriptions:  ALWAYS apply moisturizers immediately after bathing (within 3 minutes). This helps to lock-in moisture. Use the moisturizer several times a day over the whole body. Good summer moisturizers include: Aveeno, CeraVe, Cetaphil. Good winter moisturizers include: Aquaphor, Vaseline, Cerave, Cetaphil, Eucerin, Vanicream. When using moisturizers along with medications, the moisturizer should be applied about one hour after applying the medication to prevent diluting effect of the medication or moisturize around where you applied the medications. When not using medications, the moisturizer can be continued twice daily as  maintenance.  Laundry and clothing: Avoid laundry products with added color or perfumes. Use unscented hypo-allergenic laundry products such as Tide free, Cheer free & gentle, and All free and clear.  If the skin still seems dry or sensitive, you can try double-rinsing the clothes. Avoid tight or scratchy clothing such as wool. Do not use fabric softeners or dyer sheets.

## 2021-03-31 ENCOUNTER — Telehealth: Payer: Self-pay

## 2021-03-31 LAB — COMPREHENSIVE METABOLIC PANEL
ALT: 93 IU/L — ABNORMAL HIGH (ref 0–44)
AST: 71 IU/L — ABNORMAL HIGH (ref 0–40)
Albumin/Globulin Ratio: 1.8 (ref 1.2–2.2)
Albumin: 4.1 g/dL (ref 3.7–4.7)
Alkaline Phosphatase: 869 IU/L — ABNORMAL HIGH (ref 44–121)
BUN/Creatinine Ratio: 17 (ref 10–24)
BUN: 15 mg/dL (ref 8–27)
Bilirubin Total: 0.9 mg/dL (ref 0.0–1.2)
CO2: 21 mmol/L (ref 20–29)
Calcium: 9.3 mg/dL (ref 8.6–10.2)
Chloride: 105 mmol/L (ref 96–106)
Creatinine, Ser: 0.89 mg/dL (ref 0.76–1.27)
Globulin, Total: 2.3 g/dL (ref 1.5–4.5)
Glucose: 104 mg/dL — ABNORMAL HIGH (ref 70–99)
Potassium: 4.6 mmol/L (ref 3.5–5.2)
Sodium: 141 mmol/L (ref 134–144)
Total Protein: 6.4 g/dL (ref 6.0–8.5)
eGFR: 89 mL/min/{1.73_m2} (ref >59)

## 2021-03-31 NOTE — Telephone Encounter (Signed)
Patient's lab results just came in. His labs are elevated for the alkaline phosphatase please advise.

## 2021-03-31 NOTE — Telephone Encounter (Signed)
Patient came in yesterday 03/30/21 for an office visit. Dr. Maudie Mercury addressed these concerns at that visit.

## 2021-03-31 NOTE — Telephone Encounter (Signed)
Thank you :)

## 2021-03-31 NOTE — Telephone Encounter (Signed)
I reviewed labs and sent message to patient. He has known issues with elevated liver enzymes and getting a liver biopsy later this month.

## 2021-04-01 ENCOUNTER — Other Ambulatory Visit: Payer: Self-pay

## 2021-04-01 ENCOUNTER — Ambulatory Visit (INDEPENDENT_AMBULATORY_CARE_PROVIDER_SITE_OTHER): Payer: Medicare Other

## 2021-04-01 DIAGNOSIS — Z91038 Other insect allergy status: Secondary | ICD-10-CM | POA: Diagnosis not present

## 2021-04-07 ENCOUNTER — Other Ambulatory Visit: Payer: Self-pay | Admitting: Student

## 2021-04-07 ENCOUNTER — Other Ambulatory Visit: Payer: Self-pay | Admitting: Radiology

## 2021-04-08 ENCOUNTER — Ambulatory Visit (HOSPITAL_COMMUNITY)
Admission: RE | Admit: 2021-04-08 | Discharge: 2021-04-08 | Disposition: A | Discharge status: Home / Self Care | Payer: Medicare Other | Source: Ambulatory Visit | Attending: Gastroenterology | Admitting: Gastroenterology

## 2021-04-08 ENCOUNTER — Other Ambulatory Visit: Payer: Self-pay

## 2021-04-08 DIAGNOSIS — Z9049 Acquired absence of other specified parts of digestive tract: Secondary | ICD-10-CM | POA: Insufficient documentation

## 2021-04-08 DIAGNOSIS — I10 Essential (primary) hypertension: Secondary | ICD-10-CM | POA: Diagnosis not present

## 2021-04-08 DIAGNOSIS — E785 Hyperlipidemia, unspecified: Secondary | ICD-10-CM | POA: Insufficient documentation

## 2021-04-08 DIAGNOSIS — R7989 Other specified abnormal findings of blood chemistry: Secondary | ICD-10-CM | POA: Diagnosis present

## 2021-04-08 DIAGNOSIS — K831 Obstruction of bile duct: Secondary | ICD-10-CM | POA: Diagnosis not present

## 2021-04-08 DIAGNOSIS — K7400 Hepatic fibrosis, unspecified: Secondary | ICD-10-CM | POA: Insufficient documentation

## 2021-04-08 DIAGNOSIS — K7401 Hepatic fibrosis, early fibrosis: Secondary | ICD-10-CM | POA: Diagnosis not present

## 2021-04-08 DIAGNOSIS — R748 Abnormal levels of other serum enzymes: Secondary | ICD-10-CM | POA: Diagnosis not present

## 2021-04-08 LAB — CBC
HCT: 44.5 % (ref 39.0–52.0)
Hemoglobin: 15.1 g/dL (ref 13.0–17.0)
MCH: 33 pg (ref 26.0–34.0)
MCHC: 33.9 g/dL (ref 30.0–36.0)
MCV: 97.2 fL (ref 80.0–100.0)
Platelets: 199 10*3/uL (ref 150–400)
RBC: 4.58 MIL/uL (ref 4.22–5.81)
RDW: 13.7 % (ref 11.5–15.5)
WBC: 5.1 10*3/uL (ref 4.0–10.5)
nRBC: 0 % (ref 0.0–0.2)

## 2021-04-08 LAB — PROTIME-INR
INR: 1.1 (ref 0.8–1.2)
Prothrombin Time: 13.7 seconds (ref 11.4–15.2)

## 2021-04-08 MED ORDER — MIDAZOLAM HCL 2 MG/2ML IJ SOLN
INTRAMUSCULAR | Status: AC | PRN
  Administered 2021-04-08 (×3): 1 mg via INTRAVENOUS

## 2021-04-08 MED ORDER — LIDOCAINE HCL (PF) 1 % IJ SOLN
INTRAMUSCULAR | Status: AC
  Filled 2021-04-08: qty 30

## 2021-04-08 MED ORDER — MIDAZOLAM HCL 2 MG/2ML IJ SOLN
INTRAMUSCULAR | Status: AC
  Filled 2021-04-08: qty 4

## 2021-04-08 MED ORDER — FENTANYL CITRATE (PF) 100 MCG/2ML IJ SOLN
INTRAMUSCULAR | Status: AC | PRN
  Administered 2021-04-08 (×2): 50 ug via INTRAVENOUS

## 2021-04-08 MED ORDER — GELATIN ABSORBABLE 12-7 MM EX MISC
CUTANEOUS | Status: AC
  Filled 2021-04-08: qty 1

## 2021-04-08 MED ORDER — SODIUM CHLORIDE 0.9 % IV SOLN: INTRAVENOUS | Status: DC

## 2021-04-08 MED ORDER — FENTANYL CITRATE (PF) 100 MCG/2ML IJ SOLN
INTRAMUSCULAR | Status: AC
  Filled 2021-04-08: qty 4

## 2021-04-08 NOTE — Procedures (Signed)
Interventional Radiology Procedure Note  Procedure: [place] RANDOM LIVER CORE BX    Complications: None  Estimated Blood Loss:  MIN  Findings: 18 G CORES X 2    M. Daryll Brod, MD

## 2021-04-08 NOTE — H&P (Signed)
Chief Complaint: Patient was seen in consultation today for random liver bx  at the request of Ronnette Juniper  Referring Physician(s): Ronnette Juniper  Supervising Physician: Daryll Brod  Patient Status: [facility] - Out-pt  History of Present Illness: Stanley Romero is a 75 y.o. male with PMHs of HTN, HLD, gall stones cholecystectomy in March 2022, and elevated LFT.  Patient presented to ED on 11/10 due to abd pain, N/V and pruritis. Work up was  notable for elevated LFT and t bili, patient underwent ERCP on 11/11 which showed a dilated bile duct with choledocholithiasis, the stone was successfully removed during the ERCP. MRCP on 11/30 showed no explanation of elevated LFT.  A random liver biospy was recommended to the patient for further eval and management by GI, patient decided to proceed with bx after thorough discussion and shared decision making.   Patient presents to [facility] IR today for the bx.  Patient laying in bed, not in acute distress.  Denise headache, fever, chills, shortness of breath, cough, chest pain, abdominal pain, nausea ,vomiting, and bleeding.    Past Medical History:  Diagnosis Date   Diverticulitis    Hepatitis 1973   High cholesterol    History of kidney stones 2010   Hypertension     Past Surgical History:  Procedure Laterality Date   APPENDECTOMY  1956   CHOLECYSTECTOMY N/A 06/26/2020   Procedure: LAPAROSCOPIC CHOLECYSTECTOMY;  Surgeon: Greer Pickerel, MD;  Location: [facility] ORS;  Service: General;  Laterality: N/A;   COLON SURGERY  2001   COLONOSCOPY  2010   ENDOSCOPIC RETROGRADE CHOLANGIOPANCREATOGRAPHY (ERCP) WITH PROPOFOL N/A 03/06/2021   Procedure: ENDOSCOPIC RETROGRADE CHOLANGIOPANCREATOGRAPHY (ERCP) WITH PROPOFOL;  Surgeon: Ronnette Juniper, MD;  Location: [facility] ENDOSCOPY;  Service: Gastroenterology;  Laterality: N/A;   FRACTURE SURGERY Right 1959   REMOVAL OF STONES  03/06/2021   Procedure: REMOVAL OF STONES;  Surgeon: Ronnette Juniper, MD;  Location: [facility] ENDOSCOPY;   Service: Gastroenterology;;   ROTATOR CUFF REPAIR Left 2008   SPHINCTEROTOMY  03/06/2021   Procedure: Joan Mayans;  Surgeon: Ronnette Juniper, MD;  Location: [facility] ENDOSCOPY;  Service: Gastroenterology;;    Allergies: Bee venom, Wasp venom, Other, and Pollen extract  Medications: Prior to Admission medications   Medication Sig Start Date End Date Taking? Authorizing Provider  acetaminophen (TYLENOL) 325 MG tablet Take 650 mg by mouth every 6 (six) hours as needed for moderate pain.   Yes [provider]  aspirin EC 81 MG tablet Take 81 mg by mouth daily. Swallow whole.   Yes [provider]  atorvastatin (LIPITOR) 40 MG tablet Take 40 mg by mouth daily. 08/08/20  Yes [provider]  b complex vitamins capsule Take 1 capsule by mouth daily.   Yes [provider]  Carboxymethylcellul-Glycerin (LUBRICATING EYE DROPS OP) Place 1 drop into both eyes 2 (two) times daily.   Yes [provider]  Cholecalciferol (VITAMIN D) 50 MCG (2000 UT) tablet Take 2,000 Units by mouth daily.   Yes [provider]  diphenhydrAMINE (BENADRYL) 25 MG tablet Take 25 mg by mouth 2 (two) times daily as needed for allergies.   Yes [provider]  dorzolamide-timolol (COSOPT) 22.3-6.8 MG/ML ophthalmic solution Place 1 drop into both eyes 2 (two) times daily. 01/18/21  Yes [provider]  hydrocortisone cream 1 % Apply 1 application topically 2 (two) times daily as needed for itching.   Yes [provider]  MAGNESIUM PO Take 1 tablet by mouth daily.   Yes [provider]  oxybutynin (DITROPAN) 5 MG tablet Take 5 mg by mouth 3 (three) times daily. 01/18/21  Yes [provider]  telmisartan (MICARDIS) 80 MG tablet Take 80 mg by mouth daily. 06/01/20  Yes [provider]  Turmeric 500 MG CAPS Take 500 mg by mouth 2 (two) times daily.   Yes [provider]  vitamin C (ASCORBIC ACID) 500 MG tablet Take 500 mg by mouth  daily.   Yes [provider]  vitamin E 180 MG (400 UNITS) capsule Take 400 Units by mouth daily.   Yes [provider]  zinc gluconate 50 MG tablet Take 50 mg by mouth 2 (two) times daily.   Yes [provider]  zolpidem (AMBIEN) 10 MG tablet Take 10 mg by mouth at bedtime as needed for sleep.   Yes [provider]  EPINEPHrine 0.3 mg/0.3 mL IJ SOAJ injection Inject 0.3 mg into the muscle as needed for anaphylaxis. 01/22/21   Garnet Sierras, DO     Family History  Problem Relation Age of Onset   Allergic rhinitis Mother    Heart failure Father    Allergic rhinitis Sister    Asthma Neg Hx    Eczema Neg Hx    Urticaria Neg Hx     Social History   Socioeconomic History   Marital status: Single    Spouse name: Not on file   Number of children: Not on file   Years of education: Not on file   Highest education level: Not on file  Occupational History   Not on file  Tobacco Use   Smoking status: Never   Smokeless tobacco: Never  Vaping Use   Vaping Use: Never used  Substance and Sexual Activity   Alcohol use: Yes    Alcohol/week: 24.0 standard drinks    Types: 14 Glasses of wine, 10 Standard drinks or equivalent per week   Drug use: Never   Sexual activity: Not on file  Other Topics Concern   Not on file  Social History Narrative   Not on file   Social Determinants of Health   Financial Resource Strain: Not on file  Food Insecurity: Not on file  Transportation Needs: Not on file  Physical Activity: Not on file  Stress: Not on file  Social Connections: Not on file     Review of Systems: A 12 point ROS discussed and pertinent positives are indicated in the HPI above.  All other systems are negative.  Vital Signs: BP 134/73 (BP Location: Right Arm)    Pulse 64    Temp 98.1 F (36.7 C) (Oral)    Resp 18    Ht 5\' 8"  (1.727 m)    Wt 165 lb (74.8 kg)    SpO2 99%    BMI 25.09 kg/m   Physical Exam Vitals and nursing note reviewed.   Constitutional:      General: Patient is not in acute distress.    Appearance: Normal appearance. Patient is not ill-appearing.  HENT:     Head: Normocephalic and atraumatic.     Mouth/Throat:     Mouth: Mucous membranes are moist.     Pharynx: Oropharynx is clear.  Cardiovascular:     Rate and Rhythm: Normal rate and regular rhythm.     Pulses: Normal pulses.     Heart sounds: Normal heart sounds.  Pulmonary:     Effort: Pulmonary effort is normal.     Breath sounds: Normal breath sounds.  Abdominal:  General: Abdomen is flat. Bowel sounds are normal.     Palpations: Abdomen is soft.  Musculoskeletal:     Cervical back: Neck supple.  Skin:    General: Skin is warm and dry.     Coloration: Skin is not jaundiced or pale.  Neurological:     Mental Status: Patient is alert and oriented to person, place, and time.  Psychiatric:        Mood and Affect: Mood normal.        Behavior: Behavior normal.        Judgment: Judgment normal.     MD Evaluation Airway: WNL Heart: WNL Abdomen: WNL Chest/ Lungs: WNL ASA  Classification: 2 Mallampati/Airway Score: One  Imaging: MR ABDOMEN MRCP W WO CONTAST  Result Date: 03/25/2021 CLINICAL DATA:  Elevated alkaline phosphatase level. Prior bowel resection. Appendectomy. EXAM: MRI ABDOMEN WITHOUT AND WITH CONTRAST (INCLUDING MRCP) TECHNIQUE: Multiplanar multisequence MR imaging of the abdomen was performed both before and after the administration of intravenous contrast. Heavily T2-weighted images of the biliary and pancreatic ducts were obtained, and three-dimensional MRCP images were rendered by post processing. CONTRAST:  59mL MULTIHANCE GADOBENATE DIMEGLUMINE 529 MG/ML IV SOLN COMPARISON:  01/04/2021 FINDINGS: Lower chest: Normal heart size without pericardial or pleural effusion. Hepatobiliary: Normal liver.  No significant steatosis. Cholecystectomy.  Long cystic duct, including on 19/5 and 14/13. Resolution of previously described  common duct dilatation. Example 6 mm common duct on 20/13. No residual choledocholithiasis. Pancreas:  No pancreatic duct dilatation or acute inflammation. The pancreatic body/tail cystic lesions x2 are similar, including at 7 and 2-3 mm on 22/4. Pancreatic head cystic lesion is similar at 5 mm on 32/12. Spleen:  Normal in size, without focal abnormality. Adrenals/Urinary Tract: Normal adrenal glands. Bilateral renal cysts, including a dominant lower pole right renal 4.1 cm lesion. No hydronephrosis. Stomach/Bowel: Normal stomach and abdominal bowel loops. Vascular/Lymphatic: Aortic atherosclerosis. No retroperitoneal or retrocrural adenopathy. Other:  No ascites. Musculoskeletal: Right-sided T10 T2 hyperintense lesion on 12/04 similar to on the prior exam and likely a hemangioma. IMPRESSION: 1. No acute process or explanation for elevated liver function tests. 2. Resolution of common duct dilatation and passage or removal of common duct stones. 3. 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 Recommend pre and post-contrast abdominal MRI/MRCP follow-up at 2 years. This recommendation follows ACR consensus guidelines: Management of Incidental Pancreatic Cysts: A White Paper of the ACR Incidental Findings Committee. J Am Coll Radiol 0973;53:299-242. 4.  Aortic Atherosclerosis (ICD10-I70.0). Electronically Signed   By: Abigail Miyamoto M.D.   On: 03/25/2021 13:30    Labs:  CBC: Recent Labs    11/29/20 1332 11/29/20 1344 11/30/20 0431 03/05/21 1512 03/06/21 0456  WBC 18.0*  --  21.9* 5.8 4.6  HGB 15.1 15.6 13.9 14.9 14.3  HCT 47.1 46.0 42.4 45.5 43.4  PLT 147*  --  206 204 198    COAGS: Recent Labs    04/08/21 1133  INR 1.1    BMP: Recent Labs    11/29/20 1332 11/29/20 1344 11/30/20 0431 03/05/21 1512 03/06/21 0456 03/30/21 1231  NA 139   < > 136 135 137 141  K 3.3*   < > 4.5 3.9 4.1 4.6  CL 108   < > 109 101 103 105  CO2 18*  --  21* 26 25 21   GLUCOSE 149*   < >  169* 113* 113* 104*  BUN 22   < > 18 12 12 15   CALCIUM 9.0  --  8.7* 9.8 9.6 9.3  CREATININE 1.36*   < > 1.00 0.72 0.72 0.89  GFRNONAA 54*  --  >60 >60 >60  --    < > = values in this interval not displayed.    LIVER FUNCTION TESTS: Recent Labs    06/17/20 1006 03/05/21 1512 03/06/21 0456 03/30/21 1231  BILITOT 0.8 5.7* 4.5* 0.9  AST 24 81* 66* 71*  ALT 23 101* 81* 93*  ALKPHOS 109 554* 492* 869*  PROT 6.4* 7.4 6.7 6.4  ALBUMIN 3.7 3.8 3.2* 4.1    TUMOR MARKERS: No results for input(s): AFPTM, CEA, CA199, CHROMGRNA in the last 8760 hours.  Assessment and Plan: 75 y.o. male with elevated LET with unknown etiology, in need of tissue sampling for further evaluation and management.   NPO since MN VSS CBC pending  INR 1.1  On ASA 81 mg, have not taken for a week   Risks and benefits of random liver bx was discussed with the patient and/or patient's family including, but not limited to bleeding, infection, damage to adjacent structures or low yield requiring additional tests.  All of the questions were answered and there is agreement to proceed.  Consent signed and in chart.   Thank you for this interesting consult.  I greatly enjoyed meeting Stanley Romero and look forward to participating in their care.  A copy of this report was sent to the requesting provider on this date.  Electronically Signed: Tera Mater, PA-C 04/08/2021, 12:38 PM   I spent a total of  30 Minutes   in face to face in clinical consultation, greater than 50% of which was counseling/coordinating care for random liver bx   This chart was dictated using voice recognition software.  Despite best efforts to proofread,  errors can occur which can change the documentation meaning.

## 2021-04-10 ENCOUNTER — Ambulatory Visit: Payer: Medicare Other

## 2021-04-14 ENCOUNTER — Ambulatory Visit (INDEPENDENT_AMBULATORY_CARE_PROVIDER_SITE_OTHER): Payer: Medicare Other | Admitting: *Deleted

## 2021-04-14 ENCOUNTER — Other Ambulatory Visit: Payer: Self-pay

## 2021-04-14 DIAGNOSIS — T782XXD Anaphylactic shock, unspecified, subsequent encounter: Secondary | ICD-10-CM | POA: Diagnosis not present

## 2021-04-14 DIAGNOSIS — T63481D Toxic effect of venom of other arthropod, accidental (unintentional), subsequent encounter: Secondary | ICD-10-CM | POA: Diagnosis not present

## 2021-04-20 LAB — SURGICAL PATHOLOGY

## 2021-04-21 ENCOUNTER — Other Ambulatory Visit: Payer: Self-pay

## 2021-04-21 ENCOUNTER — Ambulatory Visit (INDEPENDENT_AMBULATORY_CARE_PROVIDER_SITE_OTHER): Payer: Medicare Other | Admitting: *Deleted

## 2021-04-21 DIAGNOSIS — Z91038 Other insect allergy status: Secondary | ICD-10-CM

## 2021-04-23 DIAGNOSIS — H15113 Episcleritis periodica fugax, bilateral: Secondary | ICD-10-CM | POA: Diagnosis not present

## 2021-04-23 DIAGNOSIS — H10503 Unspecified blepharoconjunctivitis, bilateral: Secondary | ICD-10-CM | POA: Diagnosis not present

## 2021-04-23 DIAGNOSIS — H16223 Keratoconjunctivitis sicca, not specified as Sjogren's, bilateral: Secondary | ICD-10-CM | POA: Diagnosis not present

## 2021-04-25 NOTE — Patient Instructions (Addendum)
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 Continue allergy shots for now Make sure you have both pens with you at the same time. If you have to use the Epipen - then please call 911 or go to the ER. For mild symptoms you can take over the counter antihistamines such as Benadryl and monitor symptoms closely. If symptoms worsen or if you have severe symptoms including breathing issues, throat closure, significant swelling, whole body hives, severe diarrhea and vomiting, lightheadedness then inject epinephrine and seek immediate medical care afterwards. Emergency action plan in place.   Itching: See below for proper skin care.   Continue cholestyramine medications as per your other doctor.  Call your GI doctor and see if she is okay with you stopping hydroxyzine due to it causing you to feel wired and starting Allegra once a day as needed for itching Continue to follow-up with GI and get lab work as recommended  Follow up in 2-3 months or sooner if needed.  Skin care recommendations  Bath time: Always use lukewarm water. AVOID very hot or cold water. Keep bathing time to 5-10 minutes. Do NOT use bubble bath. Use a mild soap and use just enough to wash the dirty areas. Do NOT scrub skin vigorously.  After bathing, pat dry your skin with a towel. Do NOT rub or scrub the skin.  Moisturizers and prescriptions:  ALWAYS apply moisturizers immediately after bathing (within 3 minutes). This helps to lock-in moisture. Use the moisturizer several times a day over the whole body. Good summer moisturizers include: Aveeno, CeraVe, Cetaphil. Good winter moisturizers include: Aquaphor, Vaseline, Cerave, Cetaphil, Eucerin, Vanicream. When using moisturizers along with medications, the moisturizer should be applied about one hour after applying the medication to prevent diluting effect of the medication or moisturize around where you applied the medications. When not using medications,  the moisturizer can be continued twice daily as maintenance.  Laundry and clothing: Avoid laundry products with added color or perfumes. Use unscented hypo-allergenic laundry products such as Tide free, Cheer free & gentle, and All free and clear.  If the skin still seems dry or sensitive, you can try double-rinsing the clothes. Avoid tight or scratchy clothing such as wool. Do not use fabric softeners or dyer sheets.

## 2021-04-27 ENCOUNTER — Ambulatory Visit (INDEPENDENT_AMBULATORY_CARE_PROVIDER_SITE_OTHER): Payer: Medicare Other | Admitting: Family

## 2021-04-27 ENCOUNTER — Ambulatory Visit: Payer: Medicare Other

## 2021-04-27 ENCOUNTER — Encounter: Payer: Self-pay | Admitting: Family

## 2021-04-27 ENCOUNTER — Other Ambulatory Visit: Payer: Self-pay

## 2021-04-27 VITALS — BP 120/72 | HR 77 | Temp 97.6°F | Resp 16 | Ht 68.0 in | Wt 172.4 lb

## 2021-04-27 DIAGNOSIS — L299 Pruritus, unspecified: Secondary | ICD-10-CM

## 2021-04-27 DIAGNOSIS — Z91038 Other insect allergy status: Secondary | ICD-10-CM

## 2021-04-27 DIAGNOSIS — T63481D Toxic effect of venom of other arthropod, accidental (unintentional), subsequent encounter: Secondary | ICD-10-CM | POA: Diagnosis not present

## 2021-04-27 DIAGNOSIS — T782XXD Anaphylactic shock, unspecified, subsequent encounter: Secondary | ICD-10-CM

## 2021-04-27 NOTE — Progress Notes (Signed)
[facility] [place] [address] Dept: [phone]  FOLLOW UP NOTE  Patient ID: Stanley Romero, male    DOB: 1946-02-09  Age: 76 y.o. 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  He was last seen on March 30, 2021 by Dr. Maudie Mercury.  Since his last office visit he reports that he had a liver biopsy and has had a stye on his right upper eyelid for the past week.  He has seen his eye doctor for the stye.  He continues to receive venom injections and since his last office visit he denies any large local reactions and he has not had any insect stings.  He has not had to use his EpiPen and his EpiPen is up-to-date.  He reports that he carries 1 EpiPen in his car and another EpiPen at home.  He agrees that it is safer for him to be on venom immunotherapy than not due to his previous reactions.  Pruritus is reported as relatively better with hydroxyzine as needed and cholestyramine.  He tries not to take hydroxyzine as much because it wires him up.  He mentions that the pruritus comes and goes but when it comes it comes at night.  He does not ever see a rash when it occurs, but an area on his chest will be red.  He reports that he had his gallbladder removed on March 1 and they left 2 stones in the duct.  He kept having attacks.  He mentions that those 2 stones were removed in November of this past year.  His liver enzymes are still elevated and he will go next week for follow-up lab work.Marland Kitchen  He mentions that his GI doctor feels like his elevated liver enzymes are from the stones. Results from the liver biopsy show":  SURGICAL PATHOLOGY SURGICAL PATHOLOGY  CASE: [id number]  PATIENT: Stanley Romero  Surgical Pathology Report      Clinical History: increased LFTs (cm)      FINAL  MICROSCOPIC DIAGNOSIS:   A. LIVER, RIGHT, NEEDLE CORE BIOPSY:  - Features of bile duct injury and distal bile duct obstruction, See  comment  - Minimal portal fibrosis, Stage 0-1 of 4.        COMMENT:   Sections reveal two cores of liver adequate for evaluation. Most of the  portal tracts are expanded by mild fibrosis and bile ductular reaction  with mild lymphocytic inflammation. Few neutrophils are associated with  the proliferating ductules. No fibro-obliterative lesions or definite  periductal onion skin-like periductal fibrosis is not identified. CK7  shows bile duct loss with ductopenia and evidence of chronic  cholestasis.  The portal inflammation overall is generally contained  within the portal tracts. The lobules are relatively quiescent and do  not contain significant necroinflammatory foci. Trichrome and reticulin  stains show that several of the rare portal tracts are expanded by mild  fibrosis with rounded contours to the portal tracts. PAS-D stain is  negative for globular intracytoplasmic inclusions. Iron and PAS stains  show no further pathologic features. The findings are suggestive of  distal bile duct obstruction. Etiology can include Choledocholithiasis  or primary sclerosing cholangitis. Differential can also include  drug-induced injury. Clinical and radiologic correlation is suggested.     GROSS DESCRIPTION:   Received in formalin are 2 cores  of soft brown tissue each 1.7 cm in  length and less than 0.1 cm in diameter.  The specimen is submitted in  toto.  [facility] 04/08/2021)    Final Diagnosis performed by Jaquita Folds, MD.   Electronically  signed 04/20/2021     Drug Allergies:  Allergies  Allergen Reactions   Bee Venom Anaphylaxis   Wasp Venom Anaphylaxis   Other Other (See Comments)    Mold  Stuffy head   Pollen Extract Other (See Comments)    Stuffy nose and head    Review of Systems: Review of Systems  Constitutional:  Negative  for chills and fever.  HENT:         Reports clear rhinorrhea and nasal congestion every spring and winter  Eyes:        Reports he has a stye in his right for the past week.  He has been to see his eye doctor.  Respiratory:  Negative for cough, shortness of breath and wheezing.   Cardiovascular:  Negative for chest pain and palpitations.  Gastrointestinal:        Reports heartburn/reflux symptoms for which he takes Tums once a day to twice a day.  He used to take another medicine that he is unaware of the name but stopped due to his elevated liver enzymes.  Genitourinary:  Positive for frequency.       Reports increased frequency of urination for several years since having an enlarged prostate  Neurological:  Negative for headaches.    Physical Exam: BP 120/72    Pulse 77    Temp 97.6 F (36.4 C)    Resp 16    Ht 5\' 8"  (1.727 m)    Wt 172 lb 6.4 oz (78.2 kg)    SpO2 96%    BMI 26.21 kg/m    Physical Exam Constitutional:      Appearance: Normal appearance.  HENT:     Head: Normocephalic and atraumatic.     Comments: Pharynx normal, ears normal, nose normal, eyes: erythema and slight inflammation noted on right upper eyelid.  Left eye normal    Right Ear: Tympanic membrane, ear canal and external ear normal.     Left Ear: Tympanic membrane, ear canal and external ear normal.     Nose: Nose normal.     Mouth/Throat:     Mouth: Mucous membranes are moist.     Pharynx: Oropharynx is clear.  Eyes:     Conjunctiva/sclera: Conjunctivae normal.  Cardiovascular:     Rate and Rhythm: Regular rhythm.     Heart sounds: Normal heart sounds.  Pulmonary:     Effort: Pulmonary effort is normal.     Breath sounds: Normal breath sounds.     Comments: Lungs clear to auscultation Musculoskeletal:     Cervical back: Neck supple.  Skin:    General: Skin is warm.  Neurological:     Mental Status: He is alert and oriented to person, place, and time.  Psychiatric:        Mood and Affect: Mood  normal.        Behavior: Behavior normal.        Thought Content: Thought content normal.        Judgment: Judgment normal.    Diagnostics: None  Assessment and Plan: 1. Anaphylaxis due to hymenoptera venom, accidental or unintentional, subsequent encounter   2. Pruritus     No orders of the defined types were placed in this encounter.   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 Continue allergy shots for now Make sure you have both pens with you at the same time. If you have to use the Epipen - then please call 911 or go to the ER. For mild symptoms you can take over the counter antihistamines such as Benadryl and monitor symptoms closely. If symptoms worsen or if you have severe symptoms including breathing issues, throat closure, significant swelling, whole body hives, severe diarrhea and vomiting, lightheadedness then inject epinephrine and seek immediate medical care afterwards. Emergency action plan in place.   Itching: See below for proper skin care.   Continue cholestyramine medications as per your other doctor.  Call your GI doctor and see if she is okay with you stopping hydroxyzine due to it causing you to feel wired and starting Allegra once a day as needed for itching Continue to follow-up with GI and get lab work as recommended  Follow up in 2-3 months or sooner if needed.  Skin care recommendations  Bath time: Always use lukewarm water. AVOID very hot or cold water. Keep bathing time to 5-10 minutes. Do NOT use bubble bath. Use a mild soap and use just enough to wash the dirty areas. Do NOT scrub skin vigorously.  After bathing, pat dry your skin with a towel. Do NOT rub or scrub the skin.  Moisturizers and prescriptions:  ALWAYS apply moisturizers immediately after bathing (within 3 minutes). This helps to lock-in moisture. Use the moisturizer several times a day over the whole body. Good summer moisturizers  include: Aveeno, CeraVe, Cetaphil. Good winter moisturizers include: Aquaphor, Vaseline, Cerave, Cetaphil, Eucerin, Vanicream. When using moisturizers along with medications, the moisturizer should be applied about one hour after applying the medication to prevent diluting effect of the medication or moisturize around where you applied the medications. When not using medications, the moisturizer can be continued twice daily as maintenance.  Laundry and clothing: Avoid laundry products with added color or perfumes. Use unscented hypo-allergenic laundry products such as Tide free, Cheer free & gentle, and All free and clear.  If the skin still seems dry or sensitive, you can try double-rinsing the clothes. Avoid tight or scratchy clothing such as wool. Do not use fabric softeners or dyer sheets.  Return in about 2 months (around 06/25/2021), or if symptoms worsen or fail to improve.    Thank you for the opportunity to care for this patient.  Please do not hesitate to contact me with questions.  Althea Charon, FNP [facility]

## 2021-04-30 DIAGNOSIS — H00011 Hordeolum externum right upper eyelid: Secondary | ICD-10-CM | POA: Diagnosis not present

## 2021-04-30 DIAGNOSIS — H10503 Unspecified blepharoconjunctivitis, bilateral: Secondary | ICD-10-CM | POA: Diagnosis not present

## 2021-04-30 DIAGNOSIS — H16223 Keratoconjunctivitis sicca, not specified as Sjogren's, bilateral: Secondary | ICD-10-CM | POA: Diagnosis not present

## 2021-05-04 DIAGNOSIS — R748 Abnormal levels of other serum enzymes: Secondary | ICD-10-CM | POA: Diagnosis not present

## 2021-05-05 ENCOUNTER — Other Ambulatory Visit: Payer: Self-pay

## 2021-05-05 ENCOUNTER — Ambulatory Visit (INDEPENDENT_AMBULATORY_CARE_PROVIDER_SITE_OTHER): Payer: Medicare Other | Admitting: *Deleted

## 2021-05-05 DIAGNOSIS — Z91038 Other insect allergy status: Secondary | ICD-10-CM | POA: Diagnosis not present

## 2021-05-12 ENCOUNTER — Other Ambulatory Visit: Payer: Self-pay

## 2021-05-12 ENCOUNTER — Ambulatory Visit (INDEPENDENT_AMBULATORY_CARE_PROVIDER_SITE_OTHER): Payer: Medicare Other

## 2021-05-12 DIAGNOSIS — Z91038 Other insect allergy status: Secondary | ICD-10-CM | POA: Diagnosis not present

## 2021-05-12 DIAGNOSIS — H26493 Other secondary cataract, bilateral: Secondary | ICD-10-CM | POA: Diagnosis not present

## 2021-05-12 DIAGNOSIS — H10503 Unspecified blepharoconjunctivitis, bilateral: Secondary | ICD-10-CM | POA: Diagnosis not present

## 2021-05-12 DIAGNOSIS — H16223 Keratoconjunctivitis sicca, not specified as Sjogren's, bilateral: Secondary | ICD-10-CM | POA: Diagnosis not present

## 2021-05-12 DIAGNOSIS — H00011 Hordeolum externum right upper eyelid: Secondary | ICD-10-CM | POA: Diagnosis not present

## 2021-05-12 DIAGNOSIS — H35033 Hypertensive retinopathy, bilateral: Secondary | ICD-10-CM | POA: Diagnosis not present

## 2021-05-12 DIAGNOSIS — H35373 Puckering of macula, bilateral: Secondary | ICD-10-CM | POA: Diagnosis not present

## 2021-05-12 DIAGNOSIS — H401132 Primary open-angle glaucoma, bilateral, moderate stage: Secondary | ICD-10-CM | POA: Diagnosis not present

## 2021-05-19 ENCOUNTER — Other Ambulatory Visit: Payer: Self-pay

## 2021-05-19 ENCOUNTER — Ambulatory Visit (INDEPENDENT_AMBULATORY_CARE_PROVIDER_SITE_OTHER): Payer: Medicare Other

## 2021-05-19 DIAGNOSIS — Z91038 Other insect allergy status: Secondary | ICD-10-CM

## 2021-05-25 DIAGNOSIS — Z1211 Encounter for screening for malignant neoplasm of colon: Secondary | ICD-10-CM | POA: Diagnosis not present

## 2021-05-25 DIAGNOSIS — R748 Abnormal levels of other serum enzymes: Secondary | ICD-10-CM | POA: Diagnosis not present

## 2021-05-25 DIAGNOSIS — L299 Pruritus, unspecified: Secondary | ICD-10-CM | POA: Diagnosis not present

## 2021-05-26 ENCOUNTER — Other Ambulatory Visit: Payer: Self-pay

## 2021-05-26 ENCOUNTER — Ambulatory Visit (INDEPENDENT_AMBULATORY_CARE_PROVIDER_SITE_OTHER): Payer: Medicare Other | Admitting: *Deleted

## 2021-05-26 DIAGNOSIS — Z91038 Other insect allergy status: Secondary | ICD-10-CM

## 2021-05-26 DIAGNOSIS — H26492 Other secondary cataract, left eye: Secondary | ICD-10-CM | POA: Diagnosis not present

## 2021-06-02 ENCOUNTER — Ambulatory Visit (INDEPENDENT_AMBULATORY_CARE_PROVIDER_SITE_OTHER): Payer: Medicare Other | Admitting: *Deleted

## 2021-06-02 ENCOUNTER — Other Ambulatory Visit: Payer: Self-pay

## 2021-06-02 DIAGNOSIS — Z91038 Other insect allergy status: Secondary | ICD-10-CM

## 2021-06-09 ENCOUNTER — Ambulatory Visit (INDEPENDENT_AMBULATORY_CARE_PROVIDER_SITE_OTHER): Payer: Medicare Other | Admitting: *Deleted

## 2021-06-09 DIAGNOSIS — Z91038 Other insect allergy status: Secondary | ICD-10-CM

## 2021-06-16 ENCOUNTER — Other Ambulatory Visit: Payer: Self-pay

## 2021-06-16 ENCOUNTER — Ambulatory Visit (INDEPENDENT_AMBULATORY_CARE_PROVIDER_SITE_OTHER): Payer: Medicare Other

## 2021-06-16 DIAGNOSIS — Z91038 Other insect allergy status: Secondary | ICD-10-CM | POA: Diagnosis not present

## 2021-06-23 ENCOUNTER — Other Ambulatory Visit: Payer: Self-pay

## 2021-06-23 ENCOUNTER — Ambulatory Visit (INDEPENDENT_AMBULATORY_CARE_PROVIDER_SITE_OTHER): Payer: Medicare Other

## 2021-06-23 DIAGNOSIS — Z91038 Other insect allergy status: Secondary | ICD-10-CM | POA: Diagnosis not present

## 2021-06-24 DIAGNOSIS — R748 Abnormal levels of other serum enzymes: Secondary | ICD-10-CM | POA: Diagnosis not present

## 2021-06-26 DIAGNOSIS — K648 Other hemorrhoids: Secondary | ICD-10-CM | POA: Diagnosis not present

## 2021-06-26 DIAGNOSIS — D122 Benign neoplasm of ascending colon: Secondary | ICD-10-CM | POA: Diagnosis not present

## 2021-06-26 DIAGNOSIS — D123 Benign neoplasm of transverse colon: Secondary | ICD-10-CM | POA: Diagnosis not present

## 2021-06-26 DIAGNOSIS — D120 Benign neoplasm of cecum: Secondary | ICD-10-CM | POA: Diagnosis not present

## 2021-06-26 DIAGNOSIS — K5730 Diverticulosis of large intestine without perforation or abscess without bleeding: Secondary | ICD-10-CM | POA: Diagnosis not present

## 2021-06-26 DIAGNOSIS — Z1211 Encounter for screening for malignant neoplasm of colon: Secondary | ICD-10-CM | POA: Diagnosis not present

## 2021-06-30 ENCOUNTER — Ambulatory Visit: Payer: Medicare Other

## 2021-06-30 DIAGNOSIS — D122 Benign neoplasm of ascending colon: Secondary | ICD-10-CM | POA: Diagnosis not present

## 2021-07-02 ENCOUNTER — Ambulatory Visit (INDEPENDENT_AMBULATORY_CARE_PROVIDER_SITE_OTHER): Payer: Medicare Other | Admitting: *Deleted

## 2021-07-02 ENCOUNTER — Other Ambulatory Visit: Payer: Self-pay

## 2021-07-02 DIAGNOSIS — Z91038 Other insect allergy status: Secondary | ICD-10-CM

## 2021-07-07 ENCOUNTER — Ambulatory Visit (INDEPENDENT_AMBULATORY_CARE_PROVIDER_SITE_OTHER): Payer: Medicare Other

## 2021-07-07 ENCOUNTER — Other Ambulatory Visit: Payer: Self-pay

## 2021-07-07 DIAGNOSIS — Z91038 Other insect allergy status: Secondary | ICD-10-CM

## 2021-07-14 ENCOUNTER — Other Ambulatory Visit: Payer: Self-pay

## 2021-07-14 ENCOUNTER — Ambulatory Visit (INDEPENDENT_AMBULATORY_CARE_PROVIDER_SITE_OTHER): Payer: Medicare Other

## 2021-07-14 DIAGNOSIS — Z91038 Other insect allergy status: Secondary | ICD-10-CM | POA: Diagnosis not present

## 2021-07-20 DIAGNOSIS — E7800 Pure hypercholesterolemia, unspecified: Secondary | ICD-10-CM | POA: Diagnosis not present

## 2021-07-20 DIAGNOSIS — G4700 Insomnia, unspecified: Secondary | ICD-10-CM | POA: Diagnosis not present

## 2021-07-20 DIAGNOSIS — Z6827 Body mass index (BMI) 27.0-27.9, adult: Secondary | ICD-10-CM | POA: Diagnosis not present

## 2021-07-20 DIAGNOSIS — N3281 Overactive bladder: Secondary | ICD-10-CM | POA: Diagnosis not present

## 2021-07-20 DIAGNOSIS — I10 Essential (primary) hypertension: Secondary | ICD-10-CM | POA: Diagnosis not present

## 2021-07-20 DIAGNOSIS — N400 Enlarged prostate without lower urinary tract symptoms: Secondary | ICD-10-CM | POA: Diagnosis not present

## 2021-07-20 DIAGNOSIS — Z23 Encounter for immunization: Secondary | ICD-10-CM | POA: Diagnosis not present

## 2021-07-20 DIAGNOSIS — H9190 Unspecified hearing loss, unspecified ear: Secondary | ICD-10-CM | POA: Diagnosis not present

## 2021-07-20 DIAGNOSIS — R7303 Prediabetes: Secondary | ICD-10-CM | POA: Diagnosis not present

## 2021-07-20 DIAGNOSIS — Z0000 Encounter for general adult medical examination without abnormal findings: Secondary | ICD-10-CM | POA: Diagnosis not present

## 2021-07-20 DIAGNOSIS — Z1389 Encounter for screening for other disorder: Secondary | ICD-10-CM | POA: Diagnosis not present

## 2021-07-20 DIAGNOSIS — I700 Atherosclerosis of aorta: Secondary | ICD-10-CM | POA: Diagnosis not present

## 2021-07-22 ENCOUNTER — Encounter: Payer: Self-pay | Admitting: Allergy

## 2021-07-22 ENCOUNTER — Ambulatory Visit (INDEPENDENT_AMBULATORY_CARE_PROVIDER_SITE_OTHER): Payer: Medicare Other

## 2021-07-22 ENCOUNTER — Ambulatory Visit (INDEPENDENT_AMBULATORY_CARE_PROVIDER_SITE_OTHER): Payer: Medicare Other | Admitting: Allergy

## 2021-07-22 VITALS — BP 110/66 | HR 70 | Temp 98.0°F | Resp 16 | Ht 67.0 in | Wt 182.0 lb

## 2021-07-22 DIAGNOSIS — L299 Pruritus, unspecified: Secondary | ICD-10-CM

## 2021-07-22 DIAGNOSIS — Z91038 Other insect allergy status: Secondary | ICD-10-CM

## 2021-07-22 DIAGNOSIS — T63481D Toxic effect of venom of other arthropod, accidental (unintentional), subsequent encounter: Secondary | ICD-10-CM | POA: Diagnosis not present

## 2021-07-22 MED ORDER — EPINEPHRINE 0.3 MG/0.3ML IJ SOAJ: 0.3000 mg | INTRAMUSCULAR | 2 refills | Status: DC | PRN

## 2021-07-22 NOTE — Patient Instructions (Addendum)
Bee sting allergy: ?Continue strict avoidance of bee stings. ?Continue allergy shots - given today.  ?Make sure you have both pens with you at the same time. ?If you have to use the Epipen - then please call 911 or go to the ER. ?For mild symptoms you can take over the counter antihistamines such as Benadryl and monitor symptoms closely. If symptoms worsen or if you have severe symptoms including breathing issues, throat closure, significant swelling, whole body hives, severe diarrhea and vomiting, lightheadedness then inject epinephrine and seek immediate medical care afterwards. ?Emergency action plan in place.  ?Recommend getting a medical alert bracelet/necklace regarding the bee sting allergy. ? ?Itching: ?Continue proper skin care.  ?Continue cholestyramine medications as per your other doctor.  ?Continue to follow-up with GI and get lab work as recommended ? ?Follow up in 12 months or sooner if needed. ?
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
## 2021-07-22 NOTE — Assessment & Plan Note (Signed)
Slowly improving. Had liver biopsy which showed some abnormalities. Followed by GI. ?? Continue proper skin care.  ?? Continue cholestyramine medications as per your other doctor.  ?? Continue to follow-up with GI.  ?

## 2021-07-29 ENCOUNTER — Ambulatory Visit (INDEPENDENT_AMBULATORY_CARE_PROVIDER_SITE_OTHER): Payer: Medicare Other

## 2021-07-29 ENCOUNTER — Ambulatory Visit: Payer: Medicare Other

## 2021-07-29 DIAGNOSIS — Z91038 Other insect allergy status: Secondary | ICD-10-CM | POA: Diagnosis not present

## 2021-08-04 ENCOUNTER — Ambulatory Visit (INDEPENDENT_AMBULATORY_CARE_PROVIDER_SITE_OTHER): Payer: Medicare Other

## 2021-08-04 DIAGNOSIS — Z91038 Other insect allergy status: Secondary | ICD-10-CM | POA: Diagnosis not present

## 2021-08-10 ENCOUNTER — Ambulatory Visit (INDEPENDENT_AMBULATORY_CARE_PROVIDER_SITE_OTHER): Payer: Medicare Other

## 2021-08-10 DIAGNOSIS — Z91038 Other insect allergy status: Secondary | ICD-10-CM

## 2021-08-21 ENCOUNTER — Ambulatory Visit (INDEPENDENT_AMBULATORY_CARE_PROVIDER_SITE_OTHER): Payer: Medicare Other

## 2021-08-21 DIAGNOSIS — Z91038 Other insect allergy status: Secondary | ICD-10-CM | POA: Diagnosis not present

## 2021-08-23 ENCOUNTER — Inpatient Hospital Stay (HOSPITAL_COMMUNITY)
Admission: EM | Admit: 2021-08-23 | Discharge: 2021-08-24 | DRG: 390 | Disposition: A | Discharge status: Home / Self Care | Payer: Medicare Other | Attending: General Surgery | Admitting: General Surgery

## 2021-08-23 ENCOUNTER — Emergency Department (HOSPITAL_COMMUNITY): Payer: Medicare Other

## 2021-08-23 ENCOUNTER — Encounter (HOSPITAL_COMMUNITY): Payer: Self-pay

## 2021-08-23 ENCOUNTER — Inpatient Hospital Stay (HOSPITAL_COMMUNITY): Payer: Medicare Other

## 2021-08-23 ENCOUNTER — Other Ambulatory Visit: Payer: Self-pay

## 2021-08-23 DIAGNOSIS — K5669 Other partial intestinal obstruction: Secondary | ICD-10-CM | POA: Diagnosis not present

## 2021-08-23 DIAGNOSIS — Z7982 Long term (current) use of aspirin: Secondary | ICD-10-CM

## 2021-08-23 DIAGNOSIS — Z87442 Personal history of urinary calculi: Secondary | ICD-10-CM | POA: Diagnosis not present

## 2021-08-23 DIAGNOSIS — Z91030 Bee allergy status: Secondary | ICD-10-CM | POA: Diagnosis not present

## 2021-08-23 DIAGNOSIS — N281 Cyst of kidney, acquired: Secondary | ICD-10-CM | POA: Diagnosis not present

## 2021-08-23 DIAGNOSIS — Z9049 Acquired absence of other specified parts of digestive tract: Secondary | ICD-10-CM

## 2021-08-23 DIAGNOSIS — Z87892 Personal history of anaphylaxis: Secondary | ICD-10-CM

## 2021-08-23 DIAGNOSIS — K56600 Partial intestinal obstruction, unspecified as to cause: Principal | ICD-10-CM | POA: Diagnosis present

## 2021-08-23 DIAGNOSIS — K6389 Other specified diseases of intestine: Secondary | ICD-10-CM | POA: Diagnosis not present

## 2021-08-23 DIAGNOSIS — Z8249 Family history of ischemic heart disease and other diseases of the circulatory system: Secondary | ICD-10-CM

## 2021-08-23 DIAGNOSIS — E7800 Pure hypercholesterolemia, unspecified: Secondary | ICD-10-CM | POA: Diagnosis not present

## 2021-08-23 DIAGNOSIS — K5730 Diverticulosis of large intestine without perforation or abscess without bleeding: Secondary | ICD-10-CM | POA: Diagnosis not present

## 2021-08-23 DIAGNOSIS — Z91048 Other nonmedicinal substance allergy status: Secondary | ICD-10-CM

## 2021-08-23 DIAGNOSIS — I10 Essential (primary) hypertension: Secondary | ICD-10-CM | POA: Diagnosis not present

## 2021-08-23 DIAGNOSIS — R112 Nausea with vomiting, unspecified: Secondary | ICD-10-CM | POA: Diagnosis not present

## 2021-08-23 DIAGNOSIS — Z79899 Other long term (current) drug therapy: Secondary | ICD-10-CM | POA: Diagnosis not present

## 2021-08-23 DIAGNOSIS — K56609 Unspecified intestinal obstruction, unspecified as to partial versus complete obstruction: Secondary | ICD-10-CM

## 2021-08-23 LAB — COMPREHENSIVE METABOLIC PANEL
ALT: 41 U/L (ref 0–44)
AST: 36 U/L (ref 15–41)
Albumin: 4.3 g/dL (ref 3.5–5.0)
Alkaline Phosphatase: 249 U/L — ABNORMAL HIGH (ref 38–126)
Anion gap: 9 (ref 5–15)
BUN: 16 mg/dL (ref 8–23)
CO2: 26 mmol/L (ref 22–32)
Calcium: 9.9 mg/dL (ref 8.9–10.3)
Chloride: 104 mmol/L (ref 98–111)
Creatinine, Ser: 0.89 mg/dL (ref 0.61–1.24)
GFR, Estimated: >60 mL/min (ref >60)
Glucose, Bld: 143 mg/dL — ABNORMAL HIGH (ref 70–99)
Potassium: 3.8 mmol/L (ref 3.5–5.1)
Sodium: 139 mmol/L (ref 135–145)
Total Bilirubin: 1.6 mg/dL — ABNORMAL HIGH (ref 0.3–1.2)
Total Protein: 7.2 g/dL (ref 6.5–8.1)

## 2021-08-23 LAB — URINALYSIS, ROUTINE W REFLEX MICROSCOPIC
Bacteria, UA: NONE SEEN
Bilirubin Urine: NEGATIVE
Glucose, UA: NEGATIVE mg/dL
Hgb urine dipstick: NEGATIVE
Ketones, ur: 20 mg/dL — AB
Leukocytes,Ua: NEGATIVE
Nitrite: NEGATIVE
Protein, ur: 30 mg/dL — AB
Specific Gravity, Urine: 1.018 (ref 1.005–1.030)
pH: 7 (ref 5.0–8.0)

## 2021-08-23 LAB — CBC
HCT: 47.8 % (ref 39.0–52.0)
Hemoglobin: 16.7 g/dL (ref 13.0–17.0)
MCH: 32.8 pg (ref 26.0–34.0)
MCHC: 34.9 g/dL (ref 30.0–36.0)
MCV: 93.9 fL (ref 80.0–100.0)
Platelets: 215 10*3/uL (ref 150–400)
RBC: 5.09 MIL/uL (ref 4.22–5.81)
RDW: 13.7 % (ref 11.5–15.5)
WBC: 11.1 10*3/uL — ABNORMAL HIGH (ref 4.0–10.5)
nRBC: 0 % (ref 0.0–0.2)

## 2021-08-23 LAB — LIPASE, BLOOD: Lipase: 37 U/L (ref 11–51)

## 2021-08-23 MED ORDER — ENOXAPARIN SODIUM 40 MG/0.4ML IJ SOSY
40.0000 mg | PREFILLED_SYRINGE | INTRAMUSCULAR | Status: DC
  Administered 2021-08-23: 40 mg via SUBCUTANEOUS
  Filled 2021-08-23: qty 0.4

## 2021-08-23 MED ORDER — HYDROMORPHONE HCL 1 MG/ML IJ SOLN: 1.0000 mg | INTRAMUSCULAR | Status: DC | PRN

## 2021-08-23 MED ORDER — OXYBUTYNIN CHLORIDE 5 MG PO TABS
5.0000 mg | ORAL_TABLET | Freq: Three times a day (TID) | ORAL | Status: DC
  Administered 2021-08-24: 5 mg via ORAL
  Filled 2021-08-23 (×2): qty 1

## 2021-08-23 MED ORDER — SODIUM CHLORIDE 0.9 % IV BOLUS
1000.0000 mL | Freq: Once | INTRAVENOUS | Status: AC
  Administered 2021-08-23: 1000 mL via INTRAVENOUS

## 2021-08-23 MED ORDER — SODIUM CHLORIDE (PF) 0.9 % IJ SOLN
INTRAMUSCULAR | Status: AC
  Filled 2021-08-23: qty 50

## 2021-08-23 MED ORDER — ONDANSETRON HCL 4 MG/2ML IJ SOLN: 4.0000 mg | Freq: Four times a day (QID) | INTRAMUSCULAR | Status: DC | PRN

## 2021-08-23 MED ORDER — ONDANSETRON HCL 4 MG/2ML IJ SOLN
4.0000 mg | Freq: Once | INTRAMUSCULAR | Status: AC
  Administered 2021-08-23: 4 mg via INTRAVENOUS
  Filled 2021-08-23: qty 2

## 2021-08-23 MED ORDER — IOHEXOL 300 MG/ML  SOLN
100.0000 mL | Freq: Once | INTRAMUSCULAR | Status: AC | PRN
  Administered 2021-08-23: 100 mL via INTRAVENOUS

## 2021-08-23 MED ORDER — POLYVINYL ALCOHOL 1.4 % OP SOLN
2.0000 [drp] | Freq: Two times a day (BID) | OPHTHALMIC | Status: DC
  Administered 2021-08-23 – 2021-08-24 (×2): 2 [drp] via OPHTHALMIC
  Filled 2021-08-23: qty 15

## 2021-08-23 MED ORDER — FAMOTIDINE IN NACL 20-0.9 MG/50ML-% IV SOLN
20.0000 mg | Freq: Once | INTRAVENOUS | Status: AC
  Administered 2021-08-23: 20 mg via INTRAVENOUS
  Filled 2021-08-23: qty 50

## 2021-08-23 MED ORDER — ZOLPIDEM TARTRATE 5 MG PO TABS
5.0000 mg | ORAL_TABLET | Freq: Every evening | ORAL | Status: DC | PRN
  Filled 2021-08-23: qty 1

## 2021-08-23 MED ORDER — ONDANSETRON 4 MG PO TBDP: 4.0000 mg | ORAL_TABLET | Freq: Four times a day (QID) | ORAL | Status: DC | PRN

## 2021-08-23 MED ORDER — DIATRIZOATE MEGLUMINE & SODIUM 66-10 % PO SOLN
90.0000 mL | Freq: Once | ORAL | Status: AC
  Administered 2021-08-23: 90 mL via NASOGASTRIC
  Filled 2021-08-23: qty 90

## 2021-08-23 MED ORDER — ALUM & MAG HYDROXIDE-SIMETH 200-200-20 MG/5ML PO SUSP
30.0000 mL | Freq: Once | ORAL | Status: AC
  Administered 2021-08-23: 30 mL via ORAL
  Filled 2021-08-23: qty 30

## 2021-08-23 MED ORDER — KCL IN DEXTROSE-NACL 20-5-0.9 MEQ/L-%-% IV SOLN
INTRAVENOUS | Status: DC
  Filled 2021-08-23 (×3): qty 1000

## 2021-08-23 MED ORDER — LIDOCAINE VISCOUS HCL 2 % MT SOLN
15.0000 mL | Freq: Once | OROMUCOSAL | Status: AC
  Administered 2021-08-23: 15 mL via ORAL
  Filled 2021-08-23: qty 15

## 2021-08-23 MED ORDER — IRBESARTAN 75 MG PO TABS
75.0000 mg | ORAL_TABLET | Freq: Every day | ORAL | Status: DC
  Administered 2021-08-23 – 2021-08-24 (×2): 75 mg via ORAL
  Filled 2021-08-23 (×2): qty 1

## 2021-08-23 MED ORDER — METOPROLOL TARTRATE 5 MG/5ML IV SOLN: 5.0000 mg | Freq: Four times a day (QID) | INTRAVENOUS | Status: DC | PRN

## 2021-08-23 MED ORDER — TAMSULOSIN HCL 0.4 MG PO CAPS
0.4000 mg | ORAL_CAPSULE | Freq: Every day | ORAL | Status: DC
  Administered 2021-08-23 – 2021-08-24 (×2): 0.4 mg via ORAL
  Filled 2021-08-23 (×2): qty 1

## 2021-08-23 NOTE — ED Triage Notes (Signed)
Patient said he has a severe stomach ache that started 8 hours ago. Pain is centralized around the belly button. Vomited several times, but dry heaving now.  ?

## 2021-08-23 NOTE — H&P (Signed)
Stanley Romero is an 76 y.o. male.   ?Chief Complaint: Nausea, vomiting abdominal pain ?HPI: Patient presents to ED with a 24-hour history of progressive nausea, vomiting abdominal pain.  This began about 9 AM yesterday.  They progressed into the evening and the pain became worse.  Location is periumbilical.  He had multiple bouts of nausea vomiting.  He had a bowel movement yesterday which was normal.  He feels better this morning.  The vomiting and retching stopped about 6 hours ago.  He had no further nausea or vomiting while in the ED.  Abdominal CT scan revealed a low-grade partial small bowel obstruction.  Patient is a history of sigmoid colectomy in 2001 and a laparoscopic cholecystectomy 2022 by Dr. [name] ?ERCP was performed in November 2022 for choledocholithiasis.  No fever or chills.  No burning when he urinates. ? ?Past Medical History:  ?Diagnosis Date  ? Diverticulitis   ? Hepatitis 1973  ? High cholesterol   ? History of kidney stones 2010  ? Hypertension   ? ? ?Past Surgical History:  ?Procedure Laterality Date  ? APPENDECTOMY  1956  ? CHOLECYSTECTOMY N/A 06/26/2020  ? Procedure: LAPAROSCOPIC CHOLECYSTECTOMY;  Surgeon: Greer Pickerel, MD;  Location: [facility];  Service: General;  Laterality: N/A;  ? COLON SURGERY  2001  ? COLONOSCOPY  2010  ? ENDOSCOPIC RETROGRADE CHOLANGIOPANCREATOGRAPHY (ERCP) WITH PROPOFOL N/A 03/06/2021  ? Procedure: ENDOSCOPIC RETROGRADE CHOLANGIOPANCREATOGRAPHY (ERCP) WITH PROPOFOL;  Surgeon: Ronnette Juniper, MD;  Location: [facility];  Service: Gastroenterology;  Laterality: N/A;  ? FRACTURE SURGERY Right 1959  ? REMOVAL OF STONES  03/06/2021  ? Procedure: REMOVAL OF STONES;  Surgeon: Ronnette Juniper, MD;  Location: [facility];  Service: Gastroenterology;;  ? ROTATOR CUFF REPAIR Left 2008  ? SPHINCTEROTOMY  03/06/2021  ? Procedure: SPHINCTEROTOMY;  Surgeon: Ronnette Juniper, MD;  Location: [facility];  Service: Gastroenterology;;  ? ? ?Family History  ?Problem Relation Age of Onset  ?  Allergic rhinitis Mother   ? Heart failure Father   ? Allergic rhinitis Sister   ? Asthma Neg Hx   ? Eczema Neg Hx   ? Urticaria Neg Hx   ? ?Social History:  reports that he has never smoked. He has never used smokeless tobacco. He reports current alcohol use of about 24.0 standard drinks per week. He reports that he does not use drugs. ? ?Allergies:  ?Allergies  ?Allergen Reactions  ? Bee Venom Anaphylaxis  ? Wasp Venom Anaphylaxis  ? Other Other (See Comments)  ?  Mold ? ?Stuffy head  ? Pollen Extract Other (See Comments)  ?  Stuffy nose and head  ? ? ?(Not in a hospital admission) ? ? ?Results for orders placed or performed during the hospital encounter of 08/23/21 (from the past 48 hour(s))  ?Lipase, blood     Status: None  ? Collection Time: 08/23/21  6:32 AM  ?Result Value Ref Range  ? Lipase 37 11 - 51 U/L  ?  Comment: Performed at [facility], [place] [address] [place], [place] [address]  ?Comprehensive metabolic panel     Status: Abnormal  ? Collection Time: 08/23/21  6:32 AM  ?Result Value Ref Range  ? Sodium 139 135 - 145 mmol/L  ? Potassium 3.8 3.5 - 5.1 mmol/L  ? Chloride 104 98 - 111 mmol/L  ? CO2 26 22 - 32 mmol/L  ? Glucose, Bld 143 (H) 70 - 99 mg/dL  ?  Comment: Glucose reference range applies only to  samples taken after fasting for at least 8 hours.  ? BUN 16 8 - 23 mg/dL  ? Creatinine, Ser 0.89 0.61 - 1.24 mg/dL  ? Calcium 9.9 8.9 - 10.3 mg/dL  ? Total Protein 7.2 6.5 - 8.1 g/dL  ? Albumin 4.3 3.5 - 5.0 g/dL  ? AST 36 15 - 41 U/L  ? ALT 41 0 - 44 U/L  ? Alkaline Phosphatase 249 (H) 38 - 126 U/L  ? Total Bilirubin 1.6 (H) 0.3 - 1.2 mg/dL  ? GFR, Estimated >60 >60 mL/min  ?  Comment: (NOTE) ?Calculated using the CKD-EPI Creatinine Equation (2021) ?  ? Anion gap 9 5 - 15  ?  Comment: Performed at [facility], [place] [address] [place], [place] [address]  ?CBC     Status: Abnormal  ? Collection Time: 08/23/21  6:32 AM  ?Result Value Ref Range  ? WBC 11.1 (H) 4.0 -  10.5 K/uL  ? RBC 5.09 4.22 - 5.81 MIL/uL  ? Hemoglobin 16.7 13.0 - 17.0 g/dL  ? HCT 47.8 39.0 - 52.0 %  ? MCV 93.9 80.0 - 100.0 fL  ? MCH 32.8 26.0 - 34.0 pg  ? MCHC 34.9 30.0 - 36.0 g/dL  ? RDW 13.7 11.5 - 15.5 %  ? Platelets 215 150 - 400 K/uL  ? nRBC 0.0 0.0 - 0.2 %  ?  Comment: Performed at [facility], [place] [address], [place], [place] [address]  ?Urinalysis, Routine w reflex microscopic Urine, Clean Catch     Status: Abnormal  ? Collection Time: 08/23/21  6:50 AM  ?Result Value Ref Range  ? Color, Urine AMBER (A) YELLOW  ?  Comment: BIOCHEMICALS MAY BE AFFECTED BY COLOR  ? APPearance CLOUDY (A) CLEAR  ? Specific Gravity, Urine 1.018 1.005 - 1.030  ? pH 7.0 5.0 - 8.0  ? Glucose, UA NEGATIVE NEGATIVE mg/dL  ? Hgb urine dipstick NEGATIVE NEGATIVE  ? Bilirubin Urine NEGATIVE NEGATIVE  ? Ketones, ur 20 (A) NEGATIVE mg/dL  ? Protein, ur 30 (A) NEGATIVE mg/dL  ? Nitrite NEGATIVE NEGATIVE  ? Leukocytes,Ua NEGATIVE NEGATIVE  ? RBC / HPF 11-20 0 - 5 RBC/hpf  ? WBC, UA 6-10 0 - 5 WBC/hpf  ? Bacteria, UA NONE SEEN NONE SEEN  ? Squamous Epithelial / LPF 0-5 0 - 5  ? Mucus PRESENT   ? Amorphous Crystal PRESENT   ?  Comment: Performed at [facility], [place] [address], [place], [place] [address]  ? ?CT ABDOMEN PELVIS W CONTRAST ? ?Result Date: 08/23/2021 ?CLINICAL DATA:  76 year old male with abdominal pain, nausea vomiting. History of bowel resection, ERCP. EXAM: CT ABDOMEN AND PELVIS WITH CONTRAST TECHNIQUE: Multidetector CT imaging of the abdomen and pelvis was performed using the standard protocol following bolus administration of intravenous contrast. RADIATION DOSE REDUCTION: This exam was performed according to the departmental dose-optimization program which includes automated exposure control, adjustment of the mA and/or kV according to patient size and/or use of iterative reconstruction technique. CONTRAST:  164m OMNIPAQUE IOHEXOL 300 MG/ML  SOLN COMPARISON:  CT Abdomen and  Pelvis 05/29/2015. ERCP report 03/06/2021. FINDINGS: Lower chest: Calcified coronary artery atherosclerosis and/or stents. Otherwise negative; mild lung base scarring. Hepatobiliary: Pneumobilia is new since 2017 but gallbladder is surgically absent, favor sphincterotomy related. There is a small volume of perihepatic free fluid with simple fluid density (series 2, image 26). Liver enhancement within normal limits. Pancreas: Chronic pancreatic atrophy. Spleen: Trace perisplenic free fluid, otherwise negative.  Adrenals/Urinary Tract: Negative; chronic benign right renal lower pole cyst (no follow-up imaging recommended). Stomach/Bowel: Fairly extensive diverticulosis of the large bowel from the splenic flexure through the sigmoid. No dilated large bowel, but fluid containing colon from the cecum to the splenic flexure. Diminutive or absent appendix. No large bowel inflammation identified. Terminal ileum is decompressed as are multiple small bowel loops located in the pelvis. However, upstream ileal loops in the right abdomen become fluid distended. And distal jejunal loops are mildly dilated with gas and fluid throughout. There is a relative transition point in the mid abdomen on series 2, image 36, also coronal image 71. Suspected he Erlinda Hong and. No free air. Relatively decompressed stomach, duodenum, and proximal jejunum. Vascular/Lymphatic: Aortoiliac calcified atherosclerosis. Normal caliber abdominal aorta. Major arterial structures appear patent. Portal venous system is patent. No lymphadenopathy identified. Reproductive: Small fat containing left inguinal hernia is stable. Other: Small volume low-density free fluid in the pelvis is abnormal on series 2, image 70. Musculoskeletal: Advanced lumbar spine degeneration appears stable since 2017. No acute osseous abnormality identified. IMPRESSION: 1. Moderate grade Small-bowel Obstruction with a relative transition point in the mid abdomen on series 2, image 36. Favor  adhesions. Small volume of free fluid in both the abdomen and pelvis. No free air. 2. Surgically absent gallbladder and post ERCP pneumobilia since 2017. 3. Pancreatic atrophy. Large bowel diverticulosis. Ao

## 2021-08-23 NOTE — ED Provider Notes (Signed)
?[facility] DEPT ?Provider Note ? ? ?CSN: [id number] ?Arrival date & time: 08/23/21  0608 ? ?  ? ?History ? ?Chief Complaint  ?Patient presents with  ? Abdominal Pain  ? ? ?Stanley Romero is a 76 y.o. male. ? ? Patient as above with significant medical history as below, including diverticulitis, hld, nephrolithiasis, prior appendectomy/cholecystectomy (choledocholithiasis) who presents to the ED with complaint of peri-umbilical pain.  Reports onset discomfort around 9 PM yesterday evening.  He began vomiting around 10 or 11 PM.  Episodes of emesis throughout the night.  He has since been dry heaving this morning without further emesis.  Unable to tolerate p.o. intake secondary to discomfort, emesis.  No melena or BRBPR.  No dysuria or hematuria.  Fevers or chills. ? ?Of note patient reports that he recently had a root canal has been taking Motrin/ibuprofen frequently.  He also recently traveled out of the country and utilize more alcohol than typical for him. ?  ? ? ? ? ?Past Medical History: ?No date: Diverticulitis ?1973: Hepatitis ?No date: High cholesterol ?2010: History of kidney stones ?No date: Hypertension ? ?Past Surgical History: ?1956: APPENDECTOMY ?06/26/2020: CHOLECYSTECTOMY; N/A ?    Comment:  Procedure: LAPAROSCOPIC CHOLECYSTECTOMY;  Surgeon:  ?             Greer Pickerel, MD;  Location: [facility];  Service: General;   ?             Laterality: N/A; ?2001: COLON SURGERY ?2010: COLONOSCOPY ?03/06/2021: ENDOSCOPIC RETROGRADE CHOLANGIOPANCREATOGRAPHY (ERCP)  ?WITH PROPOFOL; N/A ?    Comment:  Procedure: ENDOSCOPIC RETROGRADE  ?             CHOLANGIOPANCREATOGRAPHY (ERCP) WITH PROPOFOL;  Surgeon:  ?             Ronnette Juniper, MD;  Location: [facility];  Service:  ?             Gastroenterology;  Laterality: N/A; ?1959: FRACTURE SURGERY; Right ?03/06/2021: REMOVAL OF STONES ?    Comment:  Procedure: REMOVAL OF STONES;  Surgeon: Ronnette Juniper, MD; ?             Location: [facility]  ENDOSCOPY;  Service: Gastroenterology;; ?2008: [place]; Left ?03/06/2021: SPHINCTEROTOMY ?    Comment:  Procedure: SPHINCTEROTOMY;  Surgeon: Ronnette Juniper, MD;   ?             Location: [facility] ENDOS[facility];  Service: Gastroenterology;;  ? ? ?The history is provided by the patient. No language interpreter was used.  ?Abdominal Pain ?Associated symptoms: nausea and vomiting   ?Associated symptoms: no chest pain, no chills, no cough, no fever, no hematuria and no shortness of breath   ? ?  ? ?Home Medications ?Prior to Admission medications   ?Medication Sig Start Date End Date Taking? Authorizing Provider  ?aspirin EC 81 MG tablet Take 81 mg by mouth daily. Swallow whole.   Yes [provider]  ?atorvastatin (LIPITOR) 40 MG tablet Take 40 mg by mouth daily. 08/08/20  Yes [provider]  ?b complex vitamins capsule Take 1 capsule by mouth daily.   Yes [provider]  ?BISACODYL 5 MG EC tablet Take 5 mg by mouth See admin instructions. 06/23/21  Yes [provider]  ?Carboxymethylcellul-Glycerin (LUBRICATING EYE DROPS OP) Place 1 drop into both eyes 2 (two) times daily.   Yes [provider]  ?Cholecalciferol (VITAMIN D) 50 MCG (2000 UT) tablet Take 2,000 Units by mouth  daily.   Yes [provider]  ?dorzolamide-timolol (COSOPT) 22.3-6.8 MG/ML ophthalmic solution Place 1 drop into both eyes 2 (two) times daily. 01/18/21  Yes [provider]  ?EPINEPHrine 0.3 mg/0.3 mL IJ SOAJ injection Inject 0.3 mg into the muscle as needed for anaphylaxis. 07/22/21  Yes Garnet Sierras, DO  ?hydrocortisone cream 1 % Apply 1 application topically 2 (two) times daily as needed for itching.   Yes [provider]  ?hydrOXYzine (ATARAX) 25 MG tablet Take 25 mg by mouth every 6 (six) hours as needed. 04/27/21  Yes [provider]  ?MAGNESIUM PO Take 1 tablet by mouth daily.   Yes [provider]  ?oxybutynin (DITROPAN) 5 MG tablet Take 5 mg by mouth 3  (three) times daily. 01/18/21  Yes [provider]  ?PREVALITE 4 GM/DOSE powder SMARTSIG:1 scoopful By Mouth Twice Daily 03/31/21  Yes [provider]  ?tamsulosin (FLOMAX) 0.4 MG CAPS capsule Take 0.4 mg by mouth daily. 07/20/21  Yes [provider]  ?telmisartan (MICARDIS) 80 MG tablet Take 80 mg by mouth daily. 06/01/20  Yes [provider]  ?Turmeric 500 MG CAPS Take 500 mg by mouth 2 (two) times daily.   Yes [provider]  ?vitamin C (ASCORBIC ACID) 500 MG tablet Take 500 mg by mouth daily.   Yes [provider]  ?vitamin E 180 MG (400 UNITS) capsule Take 400 Units by mouth daily.   Yes [provider]  ?zinc gluconate 50 MG tablet Take 50 mg by mouth 2 (two) times daily.   Yes [provider]  ?zolpidem (AMBIEN) 10 MG tablet Take 10 mg by mouth at bedtime as needed for sleep.   Yes [provider]  ?   ? ?Allergies    ?Bee venom, Wasp venom, Other, and Pollen extract   ? ?Review of Systems   ?Review of Systems  ?Constitutional:  Positive for appetite change. Negative for chills and fever.  ?HENT:  Negative for facial swelling and trouble swallowing.   ?Eyes:  Negative for photophobia and visual disturbance.  ?Respiratory:  Negative for cough and shortness of breath.   ?Cardiovascular:  Negative for chest pain and palpitations.  ?Gastrointestinal:  Positive for abdominal pain, nausea and vomiting.  ?Endocrine: Negative for polydipsia and polyuria.  ?Genitourinary:  Negative for difficulty urinating and hematuria.  ?Musculoskeletal:  Negative for gait problem and joint swelling.  ?Skin:  Negative for pallor and rash.  ?Neurological:  Negative for syncope and headaches.  ?Psychiatric/Behavioral:  Negative for agitation and confusion.   ? ?Physical Exam ?Updated Vital Signs ?BP 119/64   Pulse 68   Temp 97.6 ?F (36.4 ?C) (Oral)   Resp 14   SpO2 98%  ?Physical Exam ?Vitals and nursing note reviewed.  ?Constitutional:   ?   General: He  is not in acute distress. ?   Appearance: He is well-developed. He is not ill-appearing or diaphoretic.  ?HENT:  ?   Head: Normocephalic and atraumatic.  ?   Right Ear: External ear normal.  ?   Left Ear: External ear normal.  ?   Mouth/Throat:  ?   Mouth: Mucous membranes are moist.  ?Eyes:  ?   General: No scleral icterus. ?Cardiovascular:  ?   Rate and Rhythm: Normal rate and regular rhythm.  ?   Pulses: Normal pulses.  ?   Heart sounds: Normal heart sounds.  ?Pulmonary:  ?   Effort: Pulmonary effort is normal. No respiratory distress.  ?  Breath sounds: Normal breath sounds.  ?Abdominal:  ?   General: Abdomen is flat. There is no distension.  ?   Palpations: Abdomen is soft. There is no pulsatile mass.  ?   Tenderness: There is abdominal tenderness in the periumbilical area.  ?Musculoskeletal:     ?   General: Normal range of motion.  ?   Cervical back: Normal range of motion.  ?   Right lower leg: No edema.  ?   Left lower leg: No edema.  ?Skin: ?   General: Skin is warm and dry.  ?   Capillary Refill: Capillary refill takes less than 2 seconds.  ?Neurological:  ?   Mental Status: He is alert and oriented to person, place, and time.  ?Psychiatric:     ?   Mood and Affect: Mood normal.     ?   Behavior: Behavior normal.  ? ? ?ED Results / Procedures / Treatments   ?Labs ?(all labs ordered are listed, but only abnormal results are displayed) ?Labs Reviewed  ?COMPREHENSIVE METABOLIC PANEL - Abnormal; Notable for the following components:  ?    Result Value  ? Glucose, Bld 143 (*)   ? Alkaline Phosphatase 249 (*)   ? Total Bilirubin 1.6 (*)   ? All other components within normal limits  ?CBC - Abnormal; Notable for the following components:  ? WBC 11.1 (*)   ? All other components within normal limits  ?URINALYSIS, ROUTINE W REFLEX MICROSCOPIC - Abnormal; Notable for the following components:  ? Color, Urine AMBER (*)   ? APPearance CLOUDY (*)   ? Ketones, ur 20 (*)   ? Protein, ur 30 (*)   ? All other components  within normal limits  ?LIPASE, BLOOD  ? ? ?EKG ?None ? ?Radiology ?CT ABDOMEN PELVIS W CONTRAST ? ?Result Date: 08/23/2021 ?CLINICAL DATA:  76 year old male with abdominal pain, nausea vomiting. History of bowel resecti

## 2021-08-24 ENCOUNTER — Inpatient Hospital Stay (HOSPITAL_COMMUNITY): Payer: Medicare Other

## 2021-08-24 DIAGNOSIS — K5669 Other partial intestinal obstruction: Secondary | ICD-10-CM | POA: Diagnosis not present

## 2021-08-24 DIAGNOSIS — K56609 Unspecified intestinal obstruction, unspecified as to partial versus complete obstruction: Secondary | ICD-10-CM | POA: Diagnosis not present

## 2021-08-24 DIAGNOSIS — Z7982 Long term (current) use of aspirin: Secondary | ICD-10-CM | POA: Diagnosis not present

## 2021-08-24 DIAGNOSIS — K6389 Other specified diseases of intestine: Secondary | ICD-10-CM | POA: Diagnosis not present

## 2021-08-24 DIAGNOSIS — Z79899 Other long term (current) drug therapy: Secondary | ICD-10-CM | POA: Diagnosis not present

## 2021-08-24 DIAGNOSIS — Z91030 Bee allergy status: Secondary | ICD-10-CM | POA: Diagnosis not present

## 2021-08-24 DIAGNOSIS — Z8249 Family history of ischemic heart disease and other diseases of the circulatory system: Secondary | ICD-10-CM | POA: Diagnosis not present

## 2021-08-24 DIAGNOSIS — Z87442 Personal history of urinary calculi: Secondary | ICD-10-CM | POA: Diagnosis not present

## 2021-08-24 DIAGNOSIS — I10 Essential (primary) hypertension: Secondary | ICD-10-CM | POA: Diagnosis not present

## 2021-08-24 DIAGNOSIS — E7800 Pure hypercholesterolemia, unspecified: Secondary | ICD-10-CM | POA: Diagnosis not present

## 2021-08-24 DIAGNOSIS — Z9049 Acquired absence of other specified parts of digestive tract: Secondary | ICD-10-CM | POA: Diagnosis not present

## 2021-08-24 DIAGNOSIS — Z91048 Other nonmedicinal substance allergy status: Secondary | ICD-10-CM | POA: Diagnosis not present

## 2021-08-24 DIAGNOSIS — Z87892 Personal history of anaphylaxis: Secondary | ICD-10-CM | POA: Diagnosis not present

## 2021-08-24 DIAGNOSIS — K56600 Partial intestinal obstruction, unspecified as to cause: Secondary | ICD-10-CM | POA: Diagnosis not present

## 2021-08-24 LAB — COMPREHENSIVE METABOLIC PANEL
ALT: 30 U/L (ref 0–44)
AST: 25 U/L (ref 15–41)
Albumin: 3.1 g/dL — ABNORMAL LOW (ref 3.5–5.0)
Alkaline Phosphatase: 184 U/L — ABNORMAL HIGH (ref 38–126)
Anion gap: <3 — ABNORMAL LOW (ref 5–15)
BUN: 12 mg/dL (ref 8–23)
CO2: 28 mmol/L (ref 22–32)
Calcium: 8.6 mg/dL — ABNORMAL LOW (ref 8.9–10.3)
Chloride: 114 mmol/L — ABNORMAL HIGH (ref 98–111)
Creatinine, Ser: 1.05 mg/dL (ref 0.61–1.24)
GFR, Estimated: >60 mL/min (ref >60)
Glucose, Bld: 110 mg/dL — ABNORMAL HIGH (ref 70–99)
Potassium: 4.1 mmol/L (ref 3.5–5.1)
Sodium: 143 mmol/L (ref 135–145)
Total Bilirubin: 1.1 mg/dL (ref 0.3–1.2)
Total Protein: 5.6 g/dL — ABNORMAL LOW (ref 6.5–8.1)

## 2021-08-24 LAB — CBC
HCT: 40.2 % (ref 39.0–52.0)
Hemoglobin: 13.4 g/dL (ref 13.0–17.0)
MCH: 32.7 pg (ref 26.0–34.0)
MCHC: 33.3 g/dL (ref 30.0–36.0)
MCV: 98 fL (ref 80.0–100.0)
Platelets: 168 10*3/uL (ref 150–400)
RBC: 4.1 MIL/uL — ABNORMAL LOW (ref 4.22–5.81)
RDW: 14.3 % (ref 11.5–15.5)
WBC: 5 10*3/uL (ref 4.0–10.5)
nRBC: 0 % (ref 0.0–0.2)

## 2021-08-24 NOTE — Progress Notes (Signed)
? ?Progress Note ? ?   ?Subjective: ?Pt reports several BMs since drinking PO gastrografin yesterday. Denies n/v. Denies abdominal pain this AM. Hopeful to maybe go home later today if doing well still.  ? ?Objective: ?Vital signs in last 24 hours: ?Temp:  [97.6 ?F (36.4 ?C)-98.1 ?F (36.7 ?C)] 97.6 ?F (36.4 ?C) (05/01 0726) ?Pulse Rate:  [62-74] 69 (05/01 0726) ?Resp:  [13-19] 18 (05/01 0726) ?BP: (105-119)/(61-74) 118/74 (05/01 0726) ?SpO2:  [90 %-100 %] 100 % (05/01 0726) ?Weight:  [80.7 kg] 80.7 kg (04/30 1255) ?Last BM Date : 08/23/21 ? ?Intake/Output from previous day: ?04/30 0701 - 05/01 0700 ?In: 2202.3 [I.V.:1102.3; IV [id number]] ?Out: 400 [Urine:400] ?Intake/Output this shift: ?No intake/output data recorded. ? ?PE: ?General: pleasant, WD, WN male who is laying in bed in NAD ?Heart: regular, rate, and rhythm.   ?Lungs: CTAB, no wheezes, rhonchi, or rales noted.  Respiratory effort nonlabored ?Abd: soft, NT, ND, +BS ?Psych: A&Ox3 with an appropriate affect.  ? ? ?Lab Results:  ?Recent Labs  ?  08/23/21 ?0632 08/24/21 ?0352  ?WBC 11.1* 5.0  ?HGB 16.7 13.4  ?HCT 47.8 40.2  ?PLT 215 168  ? ?BMET ?Recent Labs  ?  08/23/21 ?5784 08/24/21 ?0352  ?NA 139 143  ?K 3.8 4.1  ?CL 104 114*  ?CO2 26 28  ?GLUCOSE 143* 110*  ?BUN 16 12  ?CREATININE 0.89 1.05  ?CALCIUM 9.9 8.6*  ? ?PT/INR ?No results for input(s): LABPROT, INR in the last 72 hours. ?CMP  ?   ?Component Value Date/Time  ? NA 143 08/24/2021 0352  ? NA 141 03/30/2021 1231  ? K 4.1 08/24/2021 0352  ? CL 114 (H) 08/24/2021 0352  ? CO2 28 08/24/2021 0352  ? GLUCOSE 110 (H) 08/24/2021 0352  ? BUN 12 08/24/2021 0352  ? BUN 15 03/30/2021 1231  ? CREATININE 1.05 08/24/2021 0352  ? CALCIUM 8.6 (L) 08/24/2021 0352  ? PROT 5.6 (L) 08/24/2021 0352  ? PROT 6.4 03/30/2021 1231  ? ALBUMIN 3.1 (L) 08/24/2021 0352  ? ALBUMIN 4.1 03/30/2021 1231  ? AST 25 08/24/2021 0352  ? ALT 30 08/24/2021 0352  ? ALKPHOS 184 (H) 08/24/2021 0352  ? BILITOT 1.1 08/24/2021 0352  ? BILITOT  0.9 03/30/2021 1231  ? GFRNONAA >60 08/24/2021 0352  ? ?Lipase  ?   ?Component Value Date/Time  ? LIPASE 37 08/23/2021 6962  ? ? ? ? ? ?Studies/Results: ?CT ABDOMEN PELVIS W CONTRAST ? ?Result Date: 08/23/2021 ?CLINICAL DATA:  76 year old male with abdominal pain, nausea vomiting. History of bowel resection, ERCP. EXAM: CT ABDOMEN AND PELVIS WITH CONTRAST TECHNIQUE: Multidetector CT imaging of the abdomen and pelvis was performed using the standard protocol following bolus administration of intravenous contrast. RADIATION DOSE REDUCTION: This exam was performed according to the departmental dose-optimization program which includes automated exposure control, adjustment of the mA and/or kV according to patient size and/or use of iterative reconstruction technique. CONTRAST:  169m OMNIPAQUE IOHEXOL 300 MG/ML  SOLN COMPARISON:  CT Abdomen and Pelvis 05/29/2015. ERCP report 03/06/2021. FINDINGS: Lower chest: Calcified coronary artery atherosclerosis and/or stents. Otherwise negative; mild lung base scarring. Hepatobiliary: Pneumobilia is new since 2017 but gallbladder is surgically absent, favor sphincterotomy related. There is a small volume of perihepatic free fluid with simple fluid density (series 2, image 26). Liver enhancement within normal limits. Pancreas: Chronic pancreatic atrophy. Spleen: Trace perisplenic free fluid, otherwise negative. Adrenals/Urinary Tract: Negative; chronic benign right renal lower pole cyst (no follow-up imaging recommended). Stomach/Bowel: Fairly extensive diverticulosis  of the large bowel from the splenic flexure through the sigmoid. No dilated large bowel, but fluid containing colon from the cecum to the splenic flexure. Diminutive or absent appendix. No large bowel inflammation identified. Terminal ileum is decompressed as are multiple small bowel loops located in the pelvis. However, upstream ileal loops in the right abdomen become fluid distended. And distal jejunal loops are  mildly dilated with gas and fluid throughout. There is a relative transition point in the mid abdomen on series 2, image 36, also coronal image 71. Suspected he Erlinda Hong and. No free air. Relatively decompressed stomach, duodenum, and proximal jejunum. Vascular/Lymphatic: Aortoiliac calcified atherosclerosis. Normal caliber abdominal aorta. Major arterial structures appear patent. Portal venous system is patent. No lymphadenopathy identified. Reproductive: Small fat containing left inguinal hernia is stable. Other: Small volume low-density free fluid in the pelvis is abnormal on series 2, image 70. Musculoskeletal: Advanced lumbar spine degeneration appears stable since 2017. No acute osseous abnormality identified. IMPRESSION: 1. Moderate grade Small-bowel Obstruction with a relative transition point in the mid abdomen on series 2, image 36. Favor adhesions. Small volume of free fluid in both the abdomen and pelvis. No free air. 2. Surgically absent gallbladder and post ERCP pneumobilia since 2017. 3. Pancreatic atrophy. Large bowel diverticulosis. Aortic Atherosclerosis (ICD10-I70.0). Electronically Signed   By: Genevie Ann M.D.   On: 08/23/2021 08:29  ? ?DG Abd Portable 1V-Small Bowel Obstruction Protocol-initial, 8 hr delay ? ?Result Date: 08/23/2021 ?CLINICAL DATA:  Small-bowel obstruction, 8 hour delay. EXAM: PORTABLE ABDOMEN - 1 VIEW COMPARISON:  CT abdomen and pelvis 08/23/2021. FINDINGS: Oral contrast reaches the rectum is seen throughout the colon. Air-filled loops of small bowel in the mid abdomen are mildly dilated measuring up to 3.8 cm, unchanged. There are surgical clips in the right upper quadrant. Visualized lung bases are clear. Osseous structures are within normal limits. IMPRESSION: 1. Oral contrast reaches the rectum. 2. Stable mildly dilated small bowel favored is small-bowel ileus or partial bowel obstruction. No complete obstruction identified. Electronically Signed   By: Ronney Asters M.D.   On:  08/23/2021 22:33   ? ?Anti-infectives: ?Anti-infectives (From admission, onward)  ? ? None  ? ?  ? ? ? ?Assessment/Plan ?Hx of partial colectomy for diverticulitis and laparoscopic cholecystectomy ?SBO ?- CT 4/30: moderate grade SBO with relative transition in midabdomen, small volume free fluid ?- no NGT as n/v resolved ?- patient took PO gastrografin and 8h delay film with contrast to rectum, patient having bowel function ?- start FLD this AM and ADAT to soft ?- if tolerating soft diet then possibly discharge home later today  ?- no indication for acute surgical intervention  ? ?FEN: FLD, IVF to 50cc/h ?VTE: LMWH ?ID: no current abx ? ?HTN ?HLD ? LOS: 1 day  ? ? ? ?Norm Parcel, PA-C ?[facility] ?08/24/2021, 8:19 AM ?Please see Amion for pager number during day hours 7:00am-4:30pm ? ?

## 2021-08-24 NOTE — Plan of Care (Signed)

## 2021-08-24 NOTE — Discharge Summary (Signed)
[facility] ?Discharge Summary  ? ?Patient ID: ?Mora Appl Hanf ?MRN: [id number] ?DOB/AGE: 76-08-1945 76 y.o. ? ?Admit date: 08/23/2021 ?Discharge date: 08/24/2021 ? ?Admitting Diagnosis: ?Hx of partial colectomy for diverticulitis and laparoscopic cholecystectomy ?SBO ? ?Discharge Diagnosis ?Same as above  ? ?Consultants ?None  ? ?Imaging: ?CT ABDOMEN PELVIS W CONTRAST ? ?Result Date: 08/23/2021 ?CLINICAL DATA:  76 year old male with abdominal pain, nausea vomiting. History of bowel resection, ERCP. EXAM: CT ABDOMEN AND PELVIS WITH CONTRAST TECHNIQUE: Multidetector CT imaging of the abdomen and pelvis was performed using the standard protocol following bolus administration of intravenous contrast. RADIATION DOSE REDUCTION: This exam was performed according to the departmental dose-optimization program which includes automated exposure control, adjustment of the mA and/or kV according to patient size and/or use of iterative reconstruction technique. CONTRAST:  168m OMNIPAQUE IOHEXOL 300 MG/ML  SOLN COMPARISON:  CT Abdomen and Pelvis 05/29/2015. ERCP report 03/06/2021. FINDINGS: Lower chest: Calcified coronary artery atherosclerosis and/or stents. Otherwise negative; mild lung base scarring. Hepatobiliary: Pneumobilia is new since 2017 but gallbladder is surgically absent, favor sphincterotomy related. There is a small volume of perihepatic free fluid with simple fluid density (series 2, image 26). Liver enhancement within normal limits. Pancreas: Chronic pancreatic atrophy. Spleen: Trace perisplenic free fluid, otherwise negative. Adrenals/Urinary Tract: Negative; chronic benign right renal lower pole cyst (no follow-up imaging recommended). Stomach/Bowel: Fairly extensive diverticulosis of the large bowel from the splenic flexure through the sigmoid. No dilated large bowel, but fluid containing colon from the cecum to the splenic flexure. Diminutive or absent appendix. No large bowel inflammation  identified. Terminal ileum is decompressed as are multiple small bowel loops located in the pelvis. However, upstream ileal loops in the right abdomen become fluid distended. And distal jejunal loops are mildly dilated with gas and fluid throughout. There is a relative transition point in the mid abdomen on series 2, image 36, also coronal image 71. Suspected he XErlinda Hongand. No free air. Relatively decompressed stomach, duodenum, and proximal jejunum. Vascular/Lymphatic: Aortoiliac calcified atherosclerosis. Normal caliber abdominal aorta. Major arterial structures appear patent. Portal venous system is patent. No lymphadenopathy identified. Reproductive: Small fat containing left inguinal hernia is stable. Other: Small volume low-density free fluid in the pelvis is abnormal on series 2, image 70. Musculoskeletal: Advanced lumbar spine degeneration appears stable since 2017. No acute osseous abnormality identified. IMPRESSION: 1. Moderate grade Small-bowel Obstruction with a relative transition point in the mid abdomen on series 2, image 36. Favor adhesions. Small volume of free fluid in both the abdomen and pelvis. No free air. 2. Surgically absent gallbladder and post ERCP pneumobilia since 2017. 3. Pancreatic atrophy. Large bowel diverticulosis. Aortic Atherosclerosis (ICD10-I70.0). Electronically Signed   By: HGenevie AnnM.D.   On: 08/23/2021 08:29  ? ?DG Abd Portable 1V-Small Bowel Obstruction Protocol-initial, 8 hr delay ? ?Result Date: 08/23/2021 ?CLINICAL DATA:  Small-bowel obstruction, 8 hour delay. EXAM: PORTABLE ABDOMEN - 1 VIEW COMPARISON:  CT abdomen and pelvis 08/23/2021. FINDINGS: Oral contrast reaches the rectum is seen throughout the colon. Air-filled loops of small bowel in the mid abdomen are mildly dilated measuring up to 3.8 cm, unchanged. There are surgical clips in the right upper quadrant. Visualized lung bases are clear. Osseous structures are within normal limits. IMPRESSION: 1. Oral contrast reaches  the rectum. 2. Stable mildly dilated small bowel favored is small-bowel ileus or partial bowel obstruction. No complete obstruction identified. Electronically Signed   By: ARonney AstersM.D.   On: 08/23/2021 22:33   ? ?  Procedures ?None  ? ?Hospital Course:  ?Patient is a 76 year old male who presented to the ED with abdominal pain and nausea/vomiting.  Workup showed SBO.  Patient was admitted, at that time nausea and vomiting had subsided so NGT was not placed. Patient was given PO gastrografin and 8 h delay KUB showed contrast in colon to the level of the rectum.  Diet was advanced as tolerated. 24 h KUB showed passage of contrast with air throughout colon and non-obstructive bowel gas pattern. On 08/24/21, the patient was voiding well, tolerating diet, ambulating well, pain well controlled, vital signs stable and felt stable for discharge home.  Patient will follow up with PCP and [facility] as needed.  ? ? ? ?Allergies as of 08/24/2021   ? ?   Reactions  ? Bee Venom Anaphylaxis  ? Wasp Venom Anaphylaxis  ? Other Other (See Comments)  ? Mold ?Stuffy head  ? Pollen Extract Other (See Comments)  ? Stuffy nose and head  ? ?  ? ?  ?Medication List  ?  ? ?TAKE these medications   ? ?aspirin EC 81 MG tablet ?Take 81 mg by mouth daily. Swallow whole. ?  ?atorvastatin 40 MG tablet ?Commonly known as: LIPITOR ?Take 40 mg by mouth daily. ?  ?b complex vitamins capsule ?Take 1 capsule by mouth daily. ?  ?bisacodyl 5 MG EC tablet ?Generic drug: bisacodyl ?Take 5 mg by mouth See admin instructions. ?  ?dorzolamide-timolol 22.3-6.8 MG/ML ophthalmic solution ?Commonly known as: COSOPT ?Place 1 drop into both eyes 2 (two) times daily. ?  ?EPINEPHrine 0.3 mg/0.3 mL Soaj injection ?Commonly known as: EPI-PEN ?Inject 0.3 mg into the muscle as needed for anaphylaxis. ?  ?hydrocortisone cream 1 % ?Apply 1 application topically 2 (two) times daily as needed for itching. ?  ?hydrOXYzine 25 MG tablet ?Commonly known as: ATARAX ?Take 25 mg by mouth  every 6 (six) hours as needed. ?  ?LUBRICATING EYE DROPS OP ?Place 1 drop into both eyes 2 (two) times daily. ?  ?MAGNESIUM PO ?Take 1 tablet by mouth daily. ?  ?oxybutynin 5 MG tablet ?Commonly known as: DITROPAN ?Take 5 mg by mouth 3 (three) times daily. ?  ?Prevalite 4 GM/DOSE powder ?Generic drug: cholestyramine light ?SMARTSIG:1 scoopful By Mouth Twice Daily ?  ?tamsulosin 0.4 MG Caps capsule ?Commonly known as: FLOMAX ?Take 0.4 mg by mouth daily. ?  ?telmisartan 80 MG tablet ?Commonly known as: MICARDIS ?Take 80 mg by mouth daily. ?  ?Turmeric 500 MG Caps ?Take 500 mg by mouth 2 (two) times daily. ?  ?vitamin C 500 MG tablet ?Commonly known as: ASCORBIC ACID ?Take 500 mg by mouth daily. ?  ?Vitamin D 50 MCG (2000 UT) tablet ?Take 2,000 Units by mouth daily. ?  ?vitamin E 180 MG (400 UNITS) capsule ?Take 400 Units by mouth daily. ?  ?zinc gluconate 50 MG tablet ?Take 50 mg by mouth 2 (two) times daily. ?  ?zolpidem 10 MG tablet ?Commonly known as: AMBIEN ?Take 10 mg by mouth at bedtime as needed for sleep. ?  ? ?  ? ? ? ? Follow-up Information   ? ? Maury Dus, MD. Call.   ?Specialty: Family Medicine ?Why: As needed ?Contact information: ?[address] ?[address] ?[place] [address] ?[phone] ? ? ?  ?  ? ? [facility], [place]. Call.   ?Specialty: General Surgery ?Why: As needed with questions or if you are having more recurrent episodes of obstructive like symptoms ?Contact information: ?  [place] [address] ?[phone] ? ? ?  ?  ? ?  ?  ? ?  ? ? ?Signed: ?Norm Parcel , PA-C ?[facility] ?08/24/2021, 2:43 PM ?Please see Amion for pager number during day hours 7:00am-4:30pm ? ? ? ?

## 2021-08-24 NOTE — Progress Notes (Signed)
?  Transition of Care (TOC) Screening Note ? ? ?Patient Details  ?Name: Stanley Romero ?Date of Birth: 27-Mar-1946 ? ? ?Transition of Care (TOC) CM/SW Contact:    ?Infinity Jeffords, LCSW ?Phone Number: ?08/24/2021, 10:45 AM ? ? ? ?Transition of Care Department [facility]) has reviewed patient and no TOC needs have been identified at this time. We will continue to monitor patient advancement through interdisciplinary progression rounds. If new patient transition needs arise, please place a TOC consult. ? ? ?

## 2021-08-25 ENCOUNTER — Ambulatory Visit (INDEPENDENT_AMBULATORY_CARE_PROVIDER_SITE_OTHER): Payer: Medicare Other

## 2021-08-25 DIAGNOSIS — Z91038 Other insect allergy status: Secondary | ICD-10-CM

## 2021-09-01 ENCOUNTER — Ambulatory Visit (INDEPENDENT_AMBULATORY_CARE_PROVIDER_SITE_OTHER): Payer: Medicare Other

## 2021-09-01 DIAGNOSIS — Z91038 Other insect allergy status: Secondary | ICD-10-CM

## 2021-09-08 ENCOUNTER — Ambulatory Visit: Payer: Medicare Other

## 2021-09-09 ENCOUNTER — Encounter (INDEPENDENT_AMBULATORY_CARE_PROVIDER_SITE_OTHER): Payer: Self-pay

## 2021-09-09 DIAGNOSIS — H43392 Other vitreous opacities, left eye: Secondary | ICD-10-CM | POA: Diagnosis not present

## 2021-09-09 DIAGNOSIS — H35033 Hypertensive retinopathy, bilateral: Secondary | ICD-10-CM | POA: Diagnosis not present

## 2021-09-09 DIAGNOSIS — H401132 Primary open-angle glaucoma, bilateral, moderate stage: Secondary | ICD-10-CM | POA: Diagnosis not present

## 2021-09-09 DIAGNOSIS — H43821 Vitreomacular adhesion, right eye: Secondary | ICD-10-CM | POA: Diagnosis not present

## 2021-09-09 DIAGNOSIS — H35373 Puckering of macula, bilateral: Secondary | ICD-10-CM | POA: Diagnosis not present

## 2021-09-09 DIAGNOSIS — H16223 Keratoconjunctivitis sicca, not specified as Sjogren's, bilateral: Secondary | ICD-10-CM | POA: Diagnosis not present

## 2021-09-09 DIAGNOSIS — H10503 Unspecified blepharoconjunctivitis, bilateral: Secondary | ICD-10-CM | POA: Diagnosis not present

## 2021-09-10 ENCOUNTER — Encounter (INDEPENDENT_AMBULATORY_CARE_PROVIDER_SITE_OTHER): Payer: Medicare Other | Admitting: Ophthalmology

## 2021-09-10 ENCOUNTER — Ambulatory Visit (INDEPENDENT_AMBULATORY_CARE_PROVIDER_SITE_OTHER): Payer: Medicare Other | Admitting: Ophthalmology

## 2021-09-10 ENCOUNTER — Encounter (INDEPENDENT_AMBULATORY_CARE_PROVIDER_SITE_OTHER): Payer: Self-pay | Admitting: Ophthalmology

## 2021-09-10 DIAGNOSIS — H43822 Vitreomacular adhesion, left eye: Secondary | ICD-10-CM | POA: Diagnosis not present

## 2021-09-10 DIAGNOSIS — H43821 Vitreomacular adhesion, right eye: Secondary | ICD-10-CM | POA: Diagnosis not present

## 2021-09-10 DIAGNOSIS — H35371 Puckering of macula, right eye: Secondary | ICD-10-CM

## 2021-09-10 DIAGNOSIS — Z961 Presence of intraocular lens: Secondary | ICD-10-CM | POA: Insufficient documentation

## 2021-09-10 HISTORY — DX: Vitreomacular adhesion, right eye: H43.821

## 2021-09-10 NOTE — Assessment & Plan Note (Signed)
Under care of Dr. Midge Aver, looks great

## 2021-09-10 NOTE — Progress Notes (Signed)
09/10/2021     CHIEF COMPLAINT Patient presents for  Chief Complaint  Patient presents with   Retina Evaluation      HISTORY OF PRESENT ILLNESS: Stanley Romero is a 76 y.o. male who presents to the clinic today for:   HPI     Retina Evaluation           Associated Symptoms: Negative for Flashes, Floaters, Distortion, Blind Spot, Pain, Redness, Photophobia, Glare, Trauma, Scalp Tenderness, Jaw Claudication, Shoulder/Hip pain, Fever, Weight Loss and Fatigue         Comments   NP- ERM wVMT OCT FP Ref by [name]. Pt reports vision has been stable.  Pt had YAG procedure in OS performed by [name] and said that vision has improved immensely.  Pt stated, "In the past 3 months of so, starting to lose acuity in my right eye. I started noticing in the dead center there is some fogginess and it shows in the picture after my acuity exam." Pt has floaters in left eye. Pt denies FOL. Pt is still taking DORZ-TIMOLOL: 1 drop into both eyes 2x daily.        Last edited by [name] on 09/10/2021  2:28 PM.      Referring physician: [name], MD [address] [place],  [place] [address]  HISTORICAL INFORMATION:   Selected notes from the MEDICAL RECORD NUMBER       CURRENT MEDICATIONS: Current Outpatient Medications (Ophthalmic Drugs)  Medication Sig   Carboxymethylcellul-Glycerin (LUBRICATING EYE DROPS OP) Place 1 drop into both eyes 2 (two) times daily.   dorzolamide-timolol (COSOPT) 22.3-6.8 MG/ML ophthalmic solution Place 1 drop into both eyes 2 (two) times daily.   No current facility-administered medications for this visit. (Ophthalmic Drugs)   Current Outpatient Medications (Other)  Medication Sig   aspirin EC 81 MG tablet Take 81 mg by mouth daily. Swallow whole.   atorvastatin (LIPITOR) 40 MG tablet Take 40 mg by mouth daily.   b complex vitamins capsule Take 1 capsule by mouth daily.   BISACODYL 5 MG EC tablet Take 5 mg by mouth See admin  instructions.   Cholecalciferol (VITAMIN D) 50 MCG (2000 UT) tablet Take 2,000 Units by mouth daily.   EPINEPHrine 0.3 mg/0.3 mL IJ SOAJ injection Inject 0.3 mg into the muscle as needed for anaphylaxis.   hydrocortisone cream 1 % Apply 1 application topically 2 (two) times daily as needed for itching.   hydrOXYzine (ATARAX) 25 MG tablet Take 25 mg by mouth every 6 (six) hours as needed.   MAGNESIUM PO Take 1 tablet by mouth daily.   oxybutynin (DITROPAN) 5 MG tablet Take 5 mg by mouth 3 (three) times daily.   PREVALITE 4 GM/DOSE powder SMARTSIG:1 scoopful By Mouth Twice Daily   tamsulosin (FLOMAX) 0.4 MG CAPS capsule Take 0.4 mg by mouth daily.   telmisartan (MICARDIS) 80 MG tablet Take 80 mg by mouth daily.   Turmeric 500 MG CAPS Take 500 mg by mouth 2 (two) times daily.   vitamin C (ASCORBIC ACID) 500 MG tablet Take 500 mg by mouth daily.   vitamin E 180 MG (400 UNITS) capsule Take 400 Units by mouth daily.   zinc gluconate 50 MG tablet Take 50 mg by mouth 2 (two) times daily.   zolpidem (AMBIEN) 10 MG tablet Take 10 mg by mouth at bedtime as needed for sleep.   No current facility-administered medications for this visit. (Other)  REVIEW OF SYSTEMS: ROS   Negative for: Constitutional, Gastrointestinal, Neurological, Skin, Genitourinary, Musculoskeletal, HENT, Endocrine, Cardiovascular, Eyes, Respiratory, Psychiatric, Allergic/Imm, Heme/Lymph Last edited by [name] on 09/10/2021  2:22 PM.       ALLERGIES Allergies  Allergen Reactions   Bee Venom Anaphylaxis   Wasp Venom Anaphylaxis   Other Other (See Comments)    Mold  Stuffy head   Pollen Extract Other (See Comments)    Stuffy nose and head    PAST MEDICAL HISTORY Past Medical History:  Diagnosis Date   Diverticulitis    Hepatitis 1973   High cholesterol    History of kidney stones 2010   Hypertension    Past Surgical History:  Procedure Laterality Date   APPENDECTOMY  1956   CHOLECYSTECTOMY N/A 06/26/2020    Procedure: LAPAROSCOPIC CHOLECYSTECTOMY;  Surgeon: Greer Pickerel, MD;  Location: [facility];  Service: General;  Laterality: N/A;   COLON SURGERY  2001   COLONOSCOPY  2010   ENDOSCOPIC RETROGRADE CHOLANGIOPANCREATOGRAPHY (ERCP) WITH PROPOFOL N/A 03/06/2021   Procedure: ENDOSCOPIC RETROGRADE CHOLANGIOPANCREATOGRAPHY (ERCP) WITH PROPOFOL;  Surgeon: Ronnette Juniper, MD;  Location: [facility];  Service: Gastroenterology;  Laterality: N/A;   FRACTURE SURGERY Right 1959   REMOVAL OF STONES  03/06/2021   Procedure: REMOVAL OF STONES;  Surgeon: Ronnette Juniper, MD;  Location: [facility];  Service: Gastroenterology;;   ROTATOR CUFF REPAIR Left 2008   SPHINCTEROTOMY  03/06/2021   Procedure: Joan Mayans;  Surgeon: Ronnette Juniper, MD;  Location: [facility];  Service: Gastroenterology;;    FAMILY HISTORY Family History  Problem Relation Age of Onset   Allergic rhinitis Mother    Heart failure Father    Allergic rhinitis Sister    Asthma Neg Hx    Eczema Neg Hx    Urticaria Neg Hx     SOCIAL HISTORY Social History   Tobacco Use   Smoking status: Never   Smokeless tobacco: Never  Vaping Use   Vaping Use: Never used  Substance Use Topics   Alcohol use: Yes    Alcohol/week: 24.0 standard drinks    Types: 14 Glasses of wine, 10 Standard drinks or equivalent per week   Drug use: Never         OPHTHALMIC EXAM:  Base Eye Exam     Visual Acuity (ETDRS)       Right Left   Dist [place] 20/40 +2 20/20 -2   Dist ph [place] NI          Tonometry (Tonopen, 2:32 PM)       Right Left   Pressure 7 8         Pupils       Pupils Dark Light Shape React APD   Right PERRL 3 2 Round Brisk None   Left PERRL 3 2 Round Brisk None         Visual Fields       Left Right    Full Full         Extraocular Movement       Right Left    Full Full         Neuro/Psych     Oriented x3: Yes         Dilation     Both eyes: 1.0% Mydriacyl, 2.5% Phenylephrine @ 2:32 PM            Slit Lamp and Fundus Exam     External Exam       Right Left   External  Normal Normal         Slit Lamp Exam       Right Left   Lids/Lashes Normal Normal   Conjunctiva/Sclera White and quiet White and quiet   Cornea Clear Clear   Anterior Chamber Deep and quiet Deep and quiet   Iris Round and reactive Round and reactive   Lens Centered posterior chamber intraocular lens Centered posterior chamber intraocular lens   Anterior Vitreous Normal Normal         Fundus Exam       Right Left   Posterior Vitreous VMT visible to the fovea Normal   Disc Normal Normal   C/D Ratio 0.5 0.5   Macula Macular thickening, Epiretinal membrane moderate topographic distortion Normal   Vessels Normal Normal   Periphery Normal Normal            IMAGING AND PROCEDURES  Imaging and Procedures for 09/10/21  OCT, Retina - OU - Both Eyes       Right Eye Quality was good. Scan locations included subfoveal. Central Foveal Thickness: 482. Progression has no prior data. Findings include vitreous traction, epiretinal membrane, abnormal foveal contour.   Left Eye Quality was good. Scan locations included subfoveal. Central Foveal Thickness: 316. Progression has no prior data. Findings include vitreomacular adhesion , normal foveal contour.      Color Fundus Photography Optos - OU - Both Eyes       Right Eye Progression has no prior data. Disc findings include normal observations. Macula : epiretinal membrane. Vessels : normal observations. Periphery : normal observations.   Left Eye Progression has no prior data. Disc findings include normal observations. Macula : normal observations. Vessels : normal observations. Periphery : normal observations.              ASSESSMENT/PLAN:  Vitreomacular adhesion of left eye No treatment needed OS  Epiretinal membrane, right eye The nature of macular pucker (epiretinal membrane ERM) was discussed with the patient as well as threshold  criteria for vitrectomy surgery. I explained that in rare cases another surgery is needed to actually remove a second wrinkle should it regrow.  Most often, the epiretinal membrane and underlying wrinkled internal limiting membrane are removed with the first surgery, to accomplish the goals.   If the operative eye is Phakic (natural lens still present), cataract surgery is often recommended prior to Vitrectomy. This will enable the retina surgeon to have the best view during surgery and the patient to obtain optimal results in the future. Treatment options were discussed.  I have recommended at home monitoring the near vision task in a monocular (1 eye at a time), with or without near vision glasses, to look for changes or declines in reading.  Severe topographic distortion to the macular region of the right eye with interruption of the photoreceptor layer triggering vision loss.  Progression with vitreal macular traction in conjunction.  We will recommend vitrectomy membrane peel the right eye in order to restore improved macular functioning.  Prevent further vision loss  Pseudophakia, both eyes Under care of Dr. Midge Aver, looks great     ICD-10-CM   1. Epiretinal membrane, right eye  H35.371 OCT, Retina - OU - Both Eyes    Color Fundus Photography Optos - OU - Both Eyes    2. Vitreomacular adhesion of right eye  H43.821 OCT, Retina - OU - Both Eyes    Color Fundus Photography Optos - OU - Both Eyes    3. Vitreomacular  adhesion of left eye  H43.822     4. Pseudophakia, both eyes  Z96.1       1.  Condition of epiretinal membrane right eye discussed using the film analogy with topographic distortion from cellophane for the right eye to describe the nature of the condition.  Surgical intervention will be discussed whereby the patient understands that in my hands typically surgery for this condition last 15 to 20 minutes.  In his case no gas bubble will need to be injected he will likely  not require any postoperative positioning.  He will have 3 weeks of topical medications he will have restricted activity to just normal living activity with no heart rate intentionally elevated for 10 days after surgery  2.  Risk benefits reviewed and the typical clinical course of recovery.  3.  Patient to consider these issues and is likely to proceed within the next couple of months as recommended.  There is no emergency, the disturbance in the center of the vision with the photoreceptor disc is likely to trigger more long-term side effects the longer this goes uncorrected  Ophthalmic Meds Ordered this visit:  No orders of the defined types were placed in this encounter.      Return ,, [facility], [facility], for Upon patient consent, schedule vitrectomy, membrane [phone], OD.  There are no Patient Instructions on file for this visit.   Explained the diagnoses, plan, and follow up with the patient and they expressed understanding.  Patient expressed understanding of the importance of proper follow up care.   Clent Demark Kyilee Gregg M.D. Diseases & Surgery of the Retina and Vitreous [facility] [place] 09/10/21     Abbreviations: M myopia (nearsighted); A astigmatism; H hyperopia (farsighted); P presbyopia; Mrx spectacle prescription;  CTL contact lenses; OD right eye; OS left eye; OU both eyes  XT exotropia; ET esotropia; PEK punctate epithelial keratitis; PEE punctate epithelial erosions; DES dry eye syndrome; MGD meibomian gland dysfunction; ATs artificial tears; PFAT's preservative free artificial tears; [place] nuclear sclerotic cataract; PSC posterior subcapsular cataract; ERM epi-retinal membrane; PVD posterior vitreous detachment; RD retinal detachment; DM diabetes mellitus; DR diabetic retinopathy; NPDR non-proliferative diabetic retinopathy; PDR proliferative diabetic retinopathy; CSME clinically significant macular edema; DME diabetic macular edema; dbh dot blot  hemorrhages; CWS cotton wool spot; POAG primary open angle glaucoma; C/D cup-to-disc ratio; HVF humphrey visual field; GVF goldmann visual field; OCT optical coherence tomography; IOP intraocular pressure; BRVO Branch retinal vein occlusion; CRVO central retinal vein occlusion; CRAO central retinal artery occlusion; BRAO branch retinal artery occlusion; RT retinal tear; SB scleral buckle; PPV pars plana vitrectomy; VH Vitreous hemorrhage; PRP panretinal laser photocoagulation; IVK intravitreal kenalog; VMT vitreomacular traction; MH Macular hole;  NVD neovascularization of the disc; NVE neovascularization elsewhere; AREDS age related eye disease study; ARMD age related macular degeneration; POAG primary open angle glaucoma; EBMD epithelial/anterior basement membrane dystrophy; ACIOL anterior chamber intraocular lens; IOL intraocular lens; PCIOL posterior chamber intraocular lens; Phaco/IOL phacoemulsification with intraocular lens placement; [place] photorefractive keratectomy; LASIK laser assisted in situ keratomileusis; HTN hypertension; DM diabetes mellitus; COPD chronic obstructive pulmonary disease

## 2021-09-10 NOTE — Assessment & Plan Note (Signed)
No treatment needed OS

## 2021-09-10 NOTE — Assessment & Plan Note (Signed)
The nature of macular pucker (epiretinal membrane ERM) was discussed with the patient as well as threshold criteria for vitrectomy surgery. I explained that in rare cases another surgery is needed to actually remove a second wrinkle should it regrow.  Most often, the epiretinal membrane and underlying wrinkled internal limiting membrane are removed with the first surgery, to accomplish the goals.   If the operative eye is Phakic (natural lens still present), cataract surgery is often recommended prior to Vitrectomy. This will enable the retina surgeon to have the best view during surgery and the patient to obtain optimal results in the future. Treatment options were discussed.  I have recommended at home monitoring the near vision task in a monocular (1 eye at a time), with or without near vision glasses, to look for changes or declines in reading.  Severe topographic distortion to the macular region of the right eye with interruption of the photoreceptor layer triggering vision loss.  Progression with vitreal macular traction in conjunction.  We will recommend vitrectomy membrane peel the right eye in order to restore improved macular functioning.  Prevent further vision loss
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
## 2021-09-15 ENCOUNTER — Ambulatory Visit (INDEPENDENT_AMBULATORY_CARE_PROVIDER_SITE_OTHER): Payer: Medicare Other

## 2021-09-15 DIAGNOSIS — Z91038 Other insect allergy status: Secondary | ICD-10-CM

## 2021-09-22 ENCOUNTER — Ambulatory Visit: Payer: Medicare Other

## 2021-09-24 ENCOUNTER — Ambulatory Visit (INDEPENDENT_AMBULATORY_CARE_PROVIDER_SITE_OTHER): Payer: Medicare Other

## 2021-09-24 DIAGNOSIS — Z91038 Other insect allergy status: Secondary | ICD-10-CM

## 2021-09-26 DIAGNOSIS — J018 Other acute sinusitis: Secondary | ICD-10-CM | POA: Diagnosis not present

## 2021-09-30 DIAGNOSIS — M25531 Pain in right wrist: Secondary | ICD-10-CM | POA: Diagnosis not present

## 2021-09-30 DIAGNOSIS — M19131 Post-traumatic osteoarthritis, right wrist: Secondary | ICD-10-CM | POA: Diagnosis not present

## 2021-09-30 DIAGNOSIS — M25431 Effusion, right wrist: Secondary | ICD-10-CM | POA: Diagnosis not present

## 2021-10-02 ENCOUNTER — Ambulatory Visit (INDEPENDENT_AMBULATORY_CARE_PROVIDER_SITE_OTHER): Payer: Medicare Other

## 2021-10-02 DIAGNOSIS — Z91038 Other insect allergy status: Secondary | ICD-10-CM

## 2021-10-02 DIAGNOSIS — R748 Abnormal levels of other serum enzymes: Secondary | ICD-10-CM | POA: Diagnosis not present

## 2021-10-06 ENCOUNTER — Other Ambulatory Visit: Payer: Self-pay | Admitting: Sports Medicine

## 2021-10-06 ENCOUNTER — Ambulatory Visit
Admission: RE | Admit: 2021-10-06 | Discharge: 2021-10-06 | Disposition: A | Discharge status: Home / Self Care | Payer: Medicare Other | Source: Ambulatory Visit | Attending: Sports Medicine | Admitting: Sports Medicine

## 2021-10-06 DIAGNOSIS — M25531 Pain in right wrist: Secondary | ICD-10-CM

## 2021-10-19 ENCOUNTER — Ambulatory Visit (INDEPENDENT_AMBULATORY_CARE_PROVIDER_SITE_OTHER): Payer: Medicare Other

## 2021-10-19 DIAGNOSIS — Z91038 Other insect allergy status: Secondary | ICD-10-CM | POA: Diagnosis not present

## 2021-10-20 ENCOUNTER — Ambulatory Visit: Payer: Medicare Other

## 2021-10-28 ENCOUNTER — Ambulatory Visit (INDEPENDENT_AMBULATORY_CARE_PROVIDER_SITE_OTHER): Payer: Medicare Other | Admitting: Ophthalmology

## 2021-10-28 ENCOUNTER — Encounter (INDEPENDENT_AMBULATORY_CARE_PROVIDER_SITE_OTHER): Payer: Self-pay | Admitting: Ophthalmology

## 2021-10-28 DIAGNOSIS — H43821 Vitreomacular adhesion, right eye: Secondary | ICD-10-CM | POA: Diagnosis not present

## 2021-10-28 DIAGNOSIS — H35371 Puckering of macula, right eye: Secondary | ICD-10-CM | POA: Diagnosis not present

## 2021-10-28 MED ORDER — OFLOXACIN 0.3 % OP SOLN: 1.0000 [drp] | OPHTHALMIC | 0 refills | Status: AC

## 2021-10-28 MED ORDER — PREDNISOLONE ACETATE 1 % OP SUSP: 1.0000 [drp] | Freq: Four times a day (QID) | OPHTHALMIC | 0 refills | Status: AC

## 2021-10-28 NOTE — Progress Notes (Signed)
10/28/2021     CHIEF COMPLAINT Patient presents for  Chief Complaint  Patient presents with   Pre-op Exam      HISTORY OF PRESENT ILLNESS: Stanley Romero is a 76 y.o. male who presents to the clinic today for:   HPI   Pre op OD Pt states vision has been stable Pt denies any floaters or FOL in OD  Last edited by Morene Rankins, CMA on 10/28/2021 11:09 AM.      Referring physician: Maury Dus, MD [place],  [place] [address]  HISTORICAL INFORMATION:   Selected notes from the MEDICAL RECORD NUMBER       CURRENT MEDICATIONS: Current Outpatient Medications (Ophthalmic Drugs)  Medication Sig   Carboxymethylcellul-Glycerin (LUBRICATING EYE DROPS OP) Place 1 drop into both eyes 2 (two) times daily.   dorzolamide-timolol (COSOPT) 22.3-6.8 MG/ML ophthalmic solution Place 1 drop into both eyes 2 (two) times daily.   No current facility-administered medications for this visit. (Ophthalmic Drugs)   Current Outpatient Medications (Other)  Medication Sig   aspirin EC 81 MG tablet Take 81 mg by mouth daily. Swallow whole.   atorvastatin (LIPITOR) 40 MG tablet Take 40 mg by mouth daily.   b complex vitamins capsule Take 1 capsule by mouth daily.   BISACODYL 5 MG EC tablet Take 5 mg by mouth See admin instructions.   Cholecalciferol (VITAMIN D) 50 MCG (2000 UT) tablet Take 2,000 Units by mouth daily.   EPINEPHrine 0.3 mg/0.3 mL IJ SOAJ injection Inject 0.3 mg into the muscle as needed for anaphylaxis.   hydrocortisone cream 1 % Apply 1 application topically 2 (two) times daily as needed for itching.   hydrOXYzine (ATARAX) 25 MG tablet Take 25 mg by mouth every 6 (six) hours as needed.   MAGNESIUM PO Take 1 tablet by mouth daily.   oxybutynin (DITROPAN) 5 MG tablet Take 5 mg by mouth 3 (three) times daily.   PREVALITE 4 GM/DOSE powder SMARTSIG:1 scoopful By Mouth Twice Daily   tamsulosin (FLOMAX) 0.4 MG CAPS capsule Take 0.4 mg by mouth daily.    telmisartan (MICARDIS) 80 MG tablet Take 80 mg by mouth daily.   Turmeric 500 MG CAPS Take 500 mg by mouth 2 (two) times daily.   vitamin C (ASCORBIC ACID) 500 MG tablet Take 500 mg by mouth daily.   vitamin E 180 MG (400 UNITS) capsule Take 400 Units by mouth daily.   zinc gluconate 50 MG tablet Take 50 mg by mouth 2 (two) times daily.   zolpidem (AMBIEN) 10 MG tablet Take 10 mg by mouth at bedtime as needed for sleep.   No current facility-administered medications for this visit. (Other)      REVIEW OF SYSTEMS: ROS   Negative for: Constitutional, Gastrointestinal, Neurological, Skin, Genitourinary, Musculoskeletal, HENT, Endocrine, Cardiovascular, Eyes, Respiratory, Psychiatric, Allergic/Imm, Heme/Lymph Last edited by Morene Rankins, CMA on 10/28/2021 11:09 AM.       ALLERGIES Allergies  Allergen Reactions   Bee Venom Anaphylaxis   Wasp Venom Anaphylaxis   Other Other (See Comments)    Mold  Stuffy head   Pollen Extract Other (See Comments)    Stuffy nose and head    PAST MEDICAL HISTORY Past Medical History:  Diagnosis Date   Diverticulitis    Hepatitis 1973   High cholesterol    History of kidney stones 2010   Hypertension    Past Surgical History:  Procedure Laterality Date   APPENDECTOMY  1956   CHOLECYSTECTOMY N/A 06/26/2020   Procedure: LAPAROSCOPIC CHOLECYSTECTOMY;  Surgeon: Greer Pickerel, MD;  Location: [facility];  Service: General;  Laterality: N/A;   COLON SURGERY  2001   COLONOSCOPY  2010   ENDOSCOPIC RETROGRADE CHOLANGIOPANCREATOGRAPHY (ERCP) WITH PROPOFOL N/A 03/06/2021   Procedure: ENDOSCOPIC RETROGRADE CHOLANGIOPANCREATOGRAPHY (ERCP) WITH PROPOFOL;  Surgeon: Ronnette Juniper, MD;  Location: [facility];  Service: Gastroenterology;  Laterality: N/A;   FRACTURE SURGERY Right 1959   REMOVAL OF STONES  03/06/2021   Procedure: REMOVAL OF STONES;  Surgeon: Ronnette Juniper, MD;  Location: [facility];  Service: Gastroenterology;;   ROTATOR CUFF REPAIR Left 2008    SPHINCTEROTOMY  03/06/2021   Procedure: Joan Mayans;  Surgeon: Ronnette Juniper, MD;  Location: [facility];  Service: Gastroenterology;;    FAMILY HISTORY Family History  Problem Relation Age of Onset   Allergic rhinitis Mother    Heart failure Father    Allergic rhinitis Sister    Asthma Neg Hx    Eczema Neg Hx    Urticaria Neg Hx     SOCIAL HISTORY Social History   Tobacco Use   Smoking status: Never   Smokeless tobacco: Never  Vaping Use   Vaping Use: Never used  Substance Use Topics   Alcohol use: Yes    Alcohol/week: 24.0 standard drinks of alcohol    Types: 14 Glasses of wine, 10 Standard drinks or equivalent per week   Drug use: Never         OPHTHALMIC EXAM:  Base Eye Exam     Visual Acuity (Snellen - Linear)       Right Left   Dist [place] 20/50 +2 20/15         Tonometry (Tonopen, 11:14 AM)       Right Left   Pressure 9 9         Dilation     Both eyes: 1.0% Mydriacyl @ 11:15 AM            IMAGING AND PROCEDURES  Imaging and Procedures for 10/28/21           ASSESSMENT/PLAN:  Epiretinal membrane, 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  Recommend vitrectomy membrane peel right eye to begin the process of healing so as to recover visual acuity.  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 Epiretinal membrane, right eye  H35.371     2. 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  2.  3.  Ophthalmic Meds Ordered this visit:  No orders of the defined types were placed in this encounter.      No follow-ups on file.  There are no Patient Instructions on file for this  visit.   Explained the diagnoses, plan, and follow up with the patient and they expressed understanding.  Patient expressed understanding of the importance of proper follow up care.   Clent Demark Rudean Icenhour M.D. Diseases & Surgery of the Retina and Vitreous [facility]ina [facility] Diabetic [place] 10/28/21     Abbreviations: M myopia (nearsighted); A astigmatism; H hyperopia (farsighted); P presbyopia; Mrx spectacle prescription;  CTL contact lenses; OD right  eye; OS left eye; OU both eyes  XT exotropia; ET esotropia; PEK punctate epithelial keratitis; PEE punctate epithelial erosions; DES dry eye syndrome; MGD meibomian gland dysfunction; ATs artificial tears; PFAT's preservative free artificial tears; [place] nuclear sclerotic cataract; PSC posterior subcapsular cataract; ERM epi-retinal membrane; PVD posterior vitreous detachment; RD retinal detachment; DM diabetes mellitus; DR diabetic retinopathy; NPDR non-proliferative diabetic retinopathy; PDR proliferative diabetic retinopathy; CSME clinically significant macular edema; DME diabetic macular edema; dbh dot blot hemorrhages; CWS cotton wool spot; POAG primary open angle glaucoma; C/D cup-to-disc ratio; HVF humphrey visual field; GVF goldmann visual field; OCT optical coherence tomography; IOP intraocular pressure; BRVO Branch retinal vein occlusion; CRVO central retinal vein occlusion; CRAO central retinal artery occlusion; BRAO branch retinal artery occlusion; RT retinal tear; SB scleral buckle; PPV pars plana vitrectomy; VH Vitreous hemorrhage; PRP panretinal laser photocoagulation; IVK intravitreal kenalog; VMT vitreomacular traction; MH Macular hole;  NVD neovascularization of the disc; NVE neovascularization elsewhere; AREDS age related eye disease study; ARMD age related macular degeneration; POAG primary open angle glaucoma; EBMD epithelial/anterior basement membrane dystrophy; ACIOL anterior chamber intraocular lens; IOL intraocular lens; PCIOL posterior chamber  intraocular lens; Phaco/IOL phacoemulsification with intraocular lens placement; [place] photorefractive keratectomy; LASIK laser assisted in situ keratomileusis; HTN hypertension; DM diabetes mellitus; COPD chronic obstructive pulmonary disease

## 2021-10-28 NOTE — Assessment & Plan Note (Signed)
Part of the condition with VMT and ERM OD

## 2021-10-28 NOTE — Assessment & Plan Note (Signed)
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  Recommend vitrectomy membrane peel right eye to begin the process of healing so as to recover visual acuity.  Explained the patient 95 to 98% of folks do have a slow improvement in acuity and all levels of acuity are possible

## 2021-11-04 ENCOUNTER — Encounter (AMBULATORY_SURGERY_CENTER): Payer: Medicare Other | Admitting: Ophthalmology

## 2021-11-04 DIAGNOSIS — H43821 Vitreomacular adhesion, right eye: Secondary | ICD-10-CM | POA: Diagnosis not present

## 2021-11-04 DIAGNOSIS — H35371 Puckering of macula, right eye: Secondary | ICD-10-CM | POA: Diagnosis not present

## 2021-11-05 ENCOUNTER — Encounter (INDEPENDENT_AMBULATORY_CARE_PROVIDER_SITE_OTHER): Payer: Self-pay | Admitting: Ophthalmology

## 2021-11-05 ENCOUNTER — Ambulatory Visit (INDEPENDENT_AMBULATORY_CARE_PROVIDER_SITE_OTHER): Payer: Medicare Other | Admitting: Ophthalmology

## 2021-11-05 DIAGNOSIS — H43821 Vitreomacular adhesion, right eye: Secondary | ICD-10-CM

## 2021-11-05 DIAGNOSIS — H35371 Puckering of macula, right eye: Secondary | ICD-10-CM

## 2021-11-05 NOTE — Progress Notes (Signed)
11/05/2021     CHIEF COMPLAINT Patient presents for  Chief Complaint  Patient presents with   Post-op Follow-up      HISTORY OF PRESENT ILLNESS: Stanley Romero is a 76 y.o. male who presents to the clinic today for:   HPI     Post-op Follow-up           Laterality: right eye         Comments   Post op day #1  vit memb peel od Pt states he had a decent night after his procedure Pt states his vision is blurry at the moment.      Last edited by Morene Rankins, CMA on 11/05/2021  8:11 AM.      Referring physician: Maury Dus, MD [place],  [place] [address]  HISTORICAL INFORMATION:   Selected notes from the MEDICAL RECORD NUMBER       CURRENT MEDICATIONS: Current Outpatient Medications (Ophthalmic Drugs)  Medication Sig   Carboxymethylcellul-Glycerin (LUBRICATING EYE DROPS OP) Place 1 drop into both eyes 2 (two) times daily.   dorzolamide-timolol (COSOPT) 22.3-6.8 MG/ML ophthalmic solution Place 1 drop into both eyes 2 (two) times daily.   ofloxacin (OCUFLOX) 0.3 % ophthalmic solution Place 1 drop into the left eye every 4 (four) hours for 10 days.   prednisoLONE acetate (PRED FORTE) 1 % ophthalmic suspension Place 1 drop into the left eye 4 (four) times daily.   No current facility-administered medications for this visit. (Ophthalmic Drugs)   Current Outpatient Medications (Other)  Medication Sig   aspirin EC 81 MG tablet Take 81 mg by mouth daily. Swallow whole.   atorvastatin (LIPITOR) 40 MG tablet Take 40 mg by mouth daily.   b complex vitamins capsule Take 1 capsule by mouth daily.   BISACODYL 5 MG EC tablet Take 5 mg by mouth See admin instructions.   Cholecalciferol (VITAMIN D) 50 MCG (2000 UT) tablet Take 2,000 Units by mouth daily.   EPINEPHrine 0.3 mg/0.3 mL IJ SOAJ injection Inject 0.3 mg into the muscle as needed for anaphylaxis.   hydrocortisone cream 1 % Apply 1 application topically 2 (two) times daily as  needed for itching.   hydrOXYzine (ATARAX) 25 MG tablet Take 25 mg by mouth every 6 (six) hours as needed.   MAGNESIUM PO Take 1 tablet by mouth daily.   oxybutynin (DITROPAN) 5 MG tablet Take 5 mg by mouth 3 (three) times daily.   PREVALITE 4 GM/DOSE powder SMARTSIG:1 scoopful By Mouth Twice Daily   tamsulosin (FLOMAX) 0.4 MG CAPS capsule Take 0.4 mg by mouth daily.   telmisartan (MICARDIS) 80 MG tablet Take 80 mg by mouth daily.   Turmeric 500 MG CAPS Take 500 mg by mouth 2 (two) times daily.   vitamin C (ASCORBIC ACID) 500 MG tablet Take 500 mg by mouth daily.   vitamin E 180 MG (400 UNITS) capsule Take 400 Units by mouth daily.   zinc gluconate 50 MG tablet Take 50 mg by mouth 2 (two) times daily.   zolpidem (AMBIEN) 10 MG tablet Take 10 mg by mouth at bedtime as needed for sleep.   No current facility-administered medications for this visit. (Other)      REVIEW OF SYSTEMS: ROS   Negative for: Constitutional, Gastrointestinal, Neurological, Skin, Genitourinary, Musculoskeletal, HENT, Endocrine, Cardiovascular, Eyes, Respiratory, Psychiatric, Allergic/Imm, Heme/Lymph Last edited by Morene Rankins, CMA on 11/05/2021  8:09 AM.       ALLERGIES Allergies  Allergen Reactions   Bee Venom Anaphylaxis   Wasp Venom Anaphylaxis   Other Other (See Comments)    Mold  Stuffy head   Pollen Extract Other (See Comments)    Stuffy nose and head    PAST MEDICAL HISTORY Past Medical History:  Diagnosis Date   Diverticulitis    Hepatitis 1973   High cholesterol    History of kidney stones 2010   Hypertension    Past Surgical History:  Procedure Laterality Date   APPENDECTOMY  1956   CHOLECYSTECTOMY N/A 06/26/2020   Procedure: LAPAROSCOPIC CHOLECYSTECTOMY;  Surgeon: Greer Pickerel, MD;  Location: [facility];  Service: General;  Laterality: N/A;   COLON SURGERY  2001   COLONOSCOPY  2010   ENDOSCOPIC RETROGRADE CHOLANGIOPANCREATOGRAPHY (ERCP) WITH PROPOFOL N/A 03/06/2021   Procedure:  ENDOSCOPIC RETROGRADE CHOLANGIOPANCREATOGRAPHY (ERCP) WITH PROPOFOL;  Surgeon: Ronnette Juniper, MD;  Location: [facility];  Service: Gastroenterology;  Laterality: N/A;   FRACTURE SURGERY Right 1959   REMOVAL OF STONES  03/06/2021   Procedure: REMOVAL OF STONES;  Surgeon: Ronnette Juniper, MD;  Location: [facility];  Service: Gastroenterology;;   ROTATOR CUFF REPAIR Left 2008   SPHINCTEROTOMY  03/06/2021   Procedure: Joan Mayans;  Surgeon: Ronnette Juniper, MD;  Location: [facility];  Service: Gastroenterology;;    FAMILY HISTORY Family History  Problem Relation Age of Onset   Allergic rhinitis Mother    Heart failure Father    Allergic rhinitis Sister    Asthma Neg Hx    Eczema Neg Hx    Urticaria Neg Hx     SOCIAL HISTORY Social History   Tobacco Use   Smoking status: Never   Smokeless tobacco: Never  Vaping Use   Vaping Use: Never used  Substance Use Topics   Alcohol use: Yes    Alcohol/week: 24.0 standard drinks of alcohol    Types: 14 Glasses of wine, 10 Standard drinks or equivalent per week   Drug use: Never         OPHTHALMIC EXAM:  Base Eye Exam     Visual Acuity (Snellen - Linear)       Right Left   Dist [place] 20/60 -1 20/20         Tonometry (Tonopen, 8:10 AM)       Right Left   Pressure 9 10         Neuro/Psych     Oriented x3: Yes         Dilation     Right eye: 2.5% Phenylephrine, 1.0% Mydriacyl @ 8:11 AM           Slit Lamp and Fundus Exam     External Exam       Right Left   External Normal Normal         Slit Lamp Exam       Right Left   Lids/Lashes Normal Normal   Conjunctiva/Sclera White and quiet White and quiet   Cornea Clear Clear   Anterior Chamber Deep and quiet Deep and quiet   Iris Round and reactive Round and reactive   Lens Centered posterior chamber intraocular lens Centered posterior chamber intraocular lens   Anterior Vitreous Normal Normal         Fundus Exam       Right Left   Posterior  Vitreous Clear, avitric    Disc Normal    C/D Ratio 0.5    Macula Much less topo distortion, post ILM removal type changes.    Vessels  Normal    Periphery Normal             IMAGING AND PROCEDURES  Imaging and Procedures for 11/05/21           ASSESSMENT/PLAN:  Epiretinal membrane, right eye Stop day #1 today looks great.  Vitreomacular adhesion of right eye Stop day #1 looks great post vitrectomy release of VMT as well as membrane peel, ILM peel      ICD-10-CM   1. Epiretinal membrane, right eye  H35.371     2. 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  OD, looks great postop day #1.  2.  Striction's reviewed with the patient.  Patient to restart topical medication  3.  Ophthalmic Meds Ordered this visit:  No orders of the defined types were placed in this encounter.      Return in about 1 week (around 11/12/2021) for POST OP, dilate, OD, OCT.  Patient Instructions  Ofloxacin  4 times daily to the operative eye  Prednisolone acetate 1 drop to the operative eye 4 times daily  Patient instructed not to refill the medications and use them for maximum of 3 weeks.  Patient instructed do not rub the eye.  Patient has the option to use the patch at night. Ofloxacin  4 times daily to the operative eye  Prednisolone acetate 1 drop to the operative eye 4 times daily  Patient instructed not to refill the medications and use them for maximum of 3 weeks.  Patient instructed do not rub the eye.  Patient has the option to use the patch at night.No lifting and bending for 1 week. No water IN the eye for 10 days. Do not rub the eye. Wear shield at night for 1-3 days.  Continue your topical medications for a total of 3 weeks.  Do not refill your postoperative medications unless instructed.  Refrain from exercise or intentional activity which increases our heart rate above resting levels.  Normal walking to complete normal activities of your day are  appropriate.  Driving:  Legally, you only need one good eye, of 20/40 or better to drive.  However, the practice does not recommend driving during first weeks after surgery, IF you are uncomfortable with your visual functioning or capabilities.   If you have known sleep apnea, wear your CPAP as you normally should.   Explained the diagnoses, plan, and follow up with the patient and they expressed understanding.  Patient expressed understanding of the importance of proper follow up care.   Clent Demark Kemberly Taves M.D. Diseases & Surgery of the Retina and Vitreous Retina & Diabetic [place] 11/05/21     Abbreviations: M myopia (nearsighted); A astigmatism; H hyperopia (farsighted); P presbyopia; Mrx spectacle prescription;  CTL contact lenses; OD right eye; OS left eye; OU both eyes  XT exotropia; ET esotropia; PEK punctate epithelial keratitis; PEE punctate epithelial erosions; DES dry eye syndrome; MGD meibomian gland dysfunction; ATs artificial tears; PFAT's preservative free artificial tears; [place] nuclear sclerotic cataract; PSC posterior subcapsular cataract; ERM epi-retinal membrane; PVD posterior vitreous detachment; RD retinal detachment; DM diabetes mellitus; DR diabetic retinopathy; NPDR non-proliferative diabetic retinopathy; PDR proliferative diabetic retinopathy; CSME clinically significant macular edema; DME diabetic macular edema; dbh dot blot hemorrhages; CWS cotton wool spot; POAG primary open angle glaucoma; C/D cup-to-disc ratio; HVF humphrey visual field; GVF goldmann visual field; OCT optical coherence tomography; IOP intraocular pressure; BRVO Branch retinal vein occlusion; CRVO central retinal vein occlusion; CRAO  central retinal artery occlusion; BRAO branch retinal artery occlusion; RT retinal tear; SB scleral buckle; PPV pars plana vitrectomy; VH Vitreous hemorrhage; PRP panretinal laser photocoagulation; IVK intravitreal kenalog; VMT vitreomacular traction; MH Macular hole;  NVD  neovascularization of the disc; NVE neovascularization elsewhere; AREDS age related eye disease study; ARMD age related macular degeneration; POAG primary open angle glaucoma; EBMD epithelial/anterior basement membrane dystrophy; ACIOL anterior chamber intraocular lens; IOL intraocular lens; PCIOL posterior chamber intraocular lens; Phaco/IOL phacoemulsification with intraocular lens placement; [place] photorefractive keratectomy; LASIK laser assisted in situ keratomileusis; HTN hypertension; DM diabetes mellitus; COPD chronic obstructive pulmonary disease

## 2021-11-05 NOTE — Patient Instructions (Signed)
Ofloxacin  4 times daily to the operative eye  Prednisolone acetate 1 drop to the operative eye 4 times daily  Patient instructed not to refill the medications and use them for maximum of 3 weeks.  Patient instructed do not rub the eye.  Patient has the option to use the patch at night. Ofloxacin  4 times daily to the operative eye  Prednisolone acetate 1 drop to the operative eye 4 times daily  Patient instructed not to refill the medications and use them for maximum of 3 weeks.  Patient instructed do not rub the eye.  Patient has the option to use the patch at night.No lifting and bending for 1 week. No water IN the eye for 10 days. Do not rub the eye. Wear shield at night for 1-3 days.  Continue your topical medications for a total of 3 weeks.  Do not refill your postoperative medications unless instructed.  Refrain from exercise or intentional activity which increases our heart rate above resting levels.  Normal walking to complete normal activities of your day are appropriate.  Driving:  Legally, you only need one good eye, of 20/40 or better to drive.  However, the practice does not recommend driving during first weeks after surgery, IF you are uncomfortable with your visual functioning or capabilities.   If you have known sleep apnea, wear your CPAP as you normally should.

## 2021-11-05 NOTE — Assessment & Plan Note (Signed)
Stop day #1 looks great post vitrectomy release of VMT as well as membrane peel, ILM peel

## 2021-11-05 NOTE — Assessment & Plan Note (Signed)
Stop day #1 today looks great.

## 2021-11-09 ENCOUNTER — Telehealth: Payer: Self-pay | Admitting: Allergy

## 2021-11-09 ENCOUNTER — Ambulatory Visit (INDEPENDENT_AMBULATORY_CARE_PROVIDER_SITE_OTHER): Payer: Medicare Other

## 2021-11-09 ENCOUNTER — Encounter: Payer: Self-pay | Admitting: Allergy

## 2021-11-09 DIAGNOSIS — Z91038 Other insect allergy status: Secondary | ICD-10-CM | POA: Diagnosis not present

## 2021-11-09 NOTE — Telephone Encounter (Signed)
Please call patient and have him schedule a follow up visit.  See if he can come in at 11:15AM to [facility] on 7/18 or he can schedule a televisit with Webb Silversmith.  Thank you.

## 2021-11-11 ENCOUNTER — Ambulatory Visit (INDEPENDENT_AMBULATORY_CARE_PROVIDER_SITE_OTHER): Payer: Medicare Other | Admitting: Ophthalmology

## 2021-11-11 ENCOUNTER — Encounter (INDEPENDENT_AMBULATORY_CARE_PROVIDER_SITE_OTHER): Payer: Self-pay | Admitting: Ophthalmology

## 2021-11-11 DIAGNOSIS — H35371 Puckering of macula, right eye: Secondary | ICD-10-CM | POA: Diagnosis not present

## 2021-11-11 DIAGNOSIS — M13831 Other specified arthritis, right wrist: Secondary | ICD-10-CM | POA: Diagnosis not present

## 2021-11-11 DIAGNOSIS — H43821 Vitreomacular adhesion, right eye: Secondary | ICD-10-CM | POA: Diagnosis not present

## 2021-11-11 DIAGNOSIS — M25531 Pain in right wrist: Secondary | ICD-10-CM | POA: Diagnosis not present

## 2021-11-11 DIAGNOSIS — M13841 Other specified arthritis, right hand: Secondary | ICD-10-CM | POA: Diagnosis not present

## 2021-11-11 DIAGNOSIS — M65849 Other synovitis and tenosynovitis, unspecified hand: Secondary | ICD-10-CM | POA: Diagnosis not present

## 2021-11-11 DIAGNOSIS — M65841 Other synovitis and tenosynovitis, right hand: Secondary | ICD-10-CM | POA: Diagnosis not present

## 2021-11-11 NOTE — Assessment & Plan Note (Signed)
No topographic distortion remains, no remnant of ILM.  Outer retinal changes are improving nicely at 1 week post vitrectomy and ILM peel.  Company by visual acuity improvement noted by patient within 72 hours after surgery

## 2021-11-11 NOTE — Progress Notes (Signed)
11/11/2021     CHIEF COMPLAINT Patient presents for  Chief Complaint  Patient presents with   Post-op Follow-up      HISTORY OF PRESENT ILLNESS: Stanley Romero is a 76 y.o. male who presents to the clinic today for:   HPI     Post-op Follow-up           Laterality: right eye   Discomfort: floaters.  Negative for pain, itching, foreign body sensation, tearing, discharge and none         Comments   1 WEEK for POST OP, DILATE, OD, OCT. Pt stated vision has improved since last visit. In fact improved acuity OD, in 72 hours       Last edited by Hurman Horn, MD on 11/11/2021  9:09 AM.      Referring physician: Maury Dus, MD [address] [place],  [place] [address]  HISTORICAL INFORMATION:   Selected notes from the MEDICAL RECORD NUMBER       CURRENT MEDICATIONS: Current Outpatient Medications (Ophthalmic Drugs)  Medication Sig   Carboxymethylcellul-Glycerin (LUBRICATING EYE DROPS OP) Place 1 drop into both eyes 2 (two) times daily.   dorzolamide-timolol (COSOPT) 22.3-6.8 MG/ML ophthalmic solution Place 1 drop into both eyes 2 (two) times daily.   prednisoLONE acetate (PRED FORTE) 1 % ophthalmic suspension Place 1 drop into the left eye 4 (four) times daily.   No current facility-administered medications for this visit. (Ophthalmic Drugs)   Current Outpatient Medications (Other)  Medication Sig   aspirin EC 81 MG tablet Take 81 mg by mouth daily. Swallow whole.   atorvastatin (LIPITOR) 40 MG tablet Take 40 mg by mouth daily.   b complex vitamins capsule Take 1 capsule by mouth daily.   BISACODYL 5 MG EC tablet Take 5 mg by mouth See admin instructions.   Cholecalciferol (VITAMIN D) 50 MCG (2000 UT) tablet Take 2,000 Units by mouth daily.   EPINEPHrine 0.3 mg/0.3 mL IJ SOAJ injection Inject 0.3 mg into the muscle as needed for anaphylaxis.   hydrocortisone cream 1 % Apply 1 application topically 2 (two) times daily as needed for itching.    hydrOXYzine (ATARAX) 25 MG tablet Take 25 mg by mouth every 6 (six) hours as needed.   MAGNESIUM PO Take 1 tablet by mouth daily.   oxybutynin (DITROPAN) 5 MG tablet Take 5 mg by mouth 3 (three) times daily.   PREVALITE 4 GM/DOSE powder SMARTSIG:1 scoopful By Mouth Twice Daily   tamsulosin (FLOMAX) 0.4 MG CAPS capsule Take 0.4 mg by mouth daily.   telmisartan (MICARDIS) 80 MG tablet Take 80 mg by mouth daily.   Turmeric 500 MG CAPS Take 500 mg by mouth 2 (two) times daily.   vitamin C (ASCORBIC ACID) 500 MG tablet Take 500 mg by mouth daily.   vitamin E 180 MG (400 UNITS) capsule Take 400 Units by mouth daily.   zinc gluconate 50 MG tablet Take 50 mg by mouth 2 (two) times daily.   zolpidem (AMBIEN) 10 MG tablet Take 10 mg by mouth at bedtime as needed for sleep.   No current facility-administered medications for this visit. (Other)      REVIEW OF SYSTEMS: ROS   Negative for: Constitutional, Gastrointestinal, Neurological, Skin, Genitourinary, Musculoskeletal, HENT, Endocrine, Cardiovascular, Eyes, Respiratory, Psychiatric, Allergic/Imm, Heme/Lymph Last edited by [name] on 11/11/2021  8:38 AM.       ALLERGIES Allergies  Allergen Reactions   Bee Venom Anaphylaxis   Wasp Venom  Anaphylaxis   Other Other (See Comments)    Mold  Stuffy head   Pollen Extract Other (See Comments)    Stuffy nose and head    PAST MEDICAL HISTORY 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   11-04-2021, vitrectomy and release of VMT with membrane peel   Past Surgical History:  Procedure Laterality Date   APPENDECTOMY  1956   CHOLECYSTECTOMY N/A 06/26/2020   Procedure: LAPAROSCOPIC CHOLECYSTECTOMY;  Surgeon: Greer Pickerel, MD;  Location: [facility];  Service: General;  Laterality: N/A;   COLON SURGERY  2001   COLONOSCOPY  2010   ENDOSCOPIC  RETROGRADE CHOLANGIOPANCREATOGRAPHY (ERCP) WITH PROPOFOL N/A 03/06/2021   Procedure: ENDOSCOPIC RETROGRADE CHOLANGIOPANCREATOGRAPHY (ERCP) WITH PROPOFOL;  Surgeon: Ronnette Juniper, MD;  Location: [facility];  Service: Gastroenterology;  Laterality: N/A;   FRACTURE SURGERY Right 1959   REMOVAL OF STONES  03/06/2021   Procedure: REMOVAL OF STONES;  Surgeon: Ronnette Juniper, MD;  Location: [facility];  Service: Gastroenterology;;   ROTATOR CUFF REPAIR Left 2008   SPHINCTEROTOMY  03/06/2021   Procedure: Joan Mayans;  Surgeon: Ronnette Juniper, MD;  Location: [facility];  Service: Gastroenterology;;    FAMILY HISTORY Family History  Problem Relation Age of Onset   Allergic rhinitis Mother    Heart failure Father    Allergic rhinitis Sister    Asthma Neg Hx    Eczema Neg Hx    Urticaria Neg Hx     SOCIAL HISTORY Social History   Tobacco Use   Smoking status: Never   Smokeless tobacco: Never  Vaping Use   Vaping Use: Never used  Substance Use Topics   Alcohol use: Yes    Alcohol/week: 24.0 standard drinks of alcohol    Types: 14 Glasses of wine, 10 Standard drinks or equivalent per week   Drug use: Never         OPHTHALMIC EXAM:  Base Eye Exam     Visual Acuity (ETDRS)       Right Left   Dist [place] 20/30 -2 20/25 -1   Dist ph [place] 20/25 -2          Tonometry (Tonopen, 8:42 AM)       Right Left   Pressure 8 10         Pupils       Pupils APD   Right PERRL None   Left PERRL None         Visual Fields       Left Right    Full Full         Extraocular Movement       Right Left    Full Full         Neuro/Psych     Oriented x3: Yes         Dilation     Right eye: 2.5% Phenylephrine, 1.0% Mydriacyl @ 8:42 AM           Slit Lamp and Fundus Exam     External Exam       Right Left   External Normal Normal         Slit Lamp Exam       Right Left   Lids/Lashes Normal Normal   Conjunctiva/Sclera White and quiet White and quiet    Cornea Clear Clear  Anterior Chamber Deep and quiet Deep and quiet   Iris Round and reactive Round and reactive   Lens Centered posterior chamber intraocular lens Centered posterior chamber intraocular lens   Anterior Vitreous Normal Normal         Fundus Exam       Right Left   Posterior Vitreous Clear, avitric    Disc Normal    C/D Ratio 0.5    Macula Much less topo distortion, post ILM removal type changes.    Vessels Normal    Periphery Normal             IMAGING AND PROCEDURES  Imaging and Procedures for 11/11/21  OCT, Retina - OU - Both Eyes       Right Eye Quality was good. Scan locations included subfoveal. Central Foveal Thickness: 462. Progression has improved. Findings include abnormal foveal contour.   Left Eye Quality was good. Scan locations included subfoveal. Central Foveal Thickness: 313. Progression has been stable. Findings include normal foveal contour, vitreomacular adhesion .   Notes OD vastly improved macular anatomy.  Outer photoreceptor layer nodular disturbance seen preoperatively is now diminished from 122 m in vertical height now to 99 m.  This is concomitant with acuity improvement noted by the patient within 72 hours postsurgical intervention.              ASSESSMENT/PLAN:  Vitreomacular adhesion of right eye Condition resolved  Epiretinal membrane, right eye No topographic distortion remains, no remnant of ILM.  Outer retinal changes are improving nicely at 1 week post vitrectomy and ILM peel.  Company by visual acuity improvement noted by patient within 72 hours after surgery     ICD-10-CM   1. Epiretinal membrane, right eye  H35.371 OCT, Retina - OU - Both Eyes    2. 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  OD looks great, macular condition improving nicely.  Acuity improving nicely.  Symptomatic improvement also occurring.  2.  3.  Ophthalmic Meds Ordered this visit:  No orders of the defined  types were placed in this encounter.      Return in about 8 weeks (around 01/06/2022) for dilate, OD, OCT,, last week September.  Patient Instructions  Patient may resume full activity in 72 more hours.  Topical medications to be completed over the next 2 weeks.  Patient is instructed not to refill them  if they are complete prior to that time  At the end of 2 weeks prednisolone acetate (milky material) may be used once or twice daily for 3 days prior to complete cessation of treatment   Explained the diagnoses, plan, and follow up with the patient and they expressed understanding.  Patient expressed understanding of the importance of proper follow up care.   Clent Demark Natania Finigan M.D. Diseases & Surgery of the Retina and Vitreous [facility] 11/11/21     Abbreviations: M myopia (nearsighted); A astigmatism; H hyperopia (farsighted); P presbyopia; Mrx spectacle prescription;  CTL contact lenses; OD right eye; OS left eye; OU both eyes  XT exotropia; ET esotropia; PEK punctate epithelial keratitis; PEE punctate epithelial erosions; DES dry eye syndrome; MGD meibomian gland dysfunction; ATs artificial tears; PFAT's preservative free artificial tears; Dillon nuclear sclerotic cataract; PSC posterior subcapsular cataract; ERM epi-retinal membrane; PVD posterior vitreous detachment; RD retinal detachment; DM diabetes mellitus; DR diabetic retinopathy; NPDR non-proliferative diabetic retinopathy; PDR proliferative diabetic retinopathy; CSME clinically significant macular edema; DME diabetic macular  edema; dbh dot blot hemorrhages; CWS cotton wool spot; POAG primary open angle glaucoma; C/D cup-to-disc ratio; HVF humphrey visual field; GVF goldmann visual field; OCT optical coherence tomography; IOP intraocular pressure; BRVO Branch retinal vein occlusion; CRVO central retinal vein occlusion; CRAO central retinal artery occlusion; BRAO branch retinal artery occlusion; RT retinal tear; SB  scleral buckle; PPV pars plana vitrectomy; VH Vitreous hemorrhage; PRP panretinal laser photocoagulation; IVK intravitreal kenalog; VMT vitreomacular traction; MH Macular hole;  NVD neovascularization of the disc; NVE neovascularization elsewhere; AREDS age related eye disease study; ARMD age related macular degeneration; POAG primary open angle glaucoma; EBMD epithelial/anterior basement membrane dystrophy; ACIOL anterior chamber intraocular lens; IOL intraocular lens; PCIOL posterior chamber intraocular lens; Phaco/IOL phacoemulsification with intraocular lens placement; Gail photorefractive keratectomy; LASIK laser assisted in situ keratomileusis; HTN hypertension; DM diabetes mellitus; COPD chronic obstructive pulmonary disease

## 2021-11-11 NOTE — Assessment & Plan Note (Signed)
Condition resolved

## 2021-11-11 NOTE — Patient Instructions (Signed)
Patient may resume full activity in 72 more hours.  Topical medications to be completed over the next 2 weeks.  Patient is instructed not to refill them  if they are complete prior to that time  At the end of 2 weeks prednisolone acetate (milky material) may be used once or twice daily for 3 days prior to complete cessation of treatment

## 2021-11-12 DIAGNOSIS — M1990 Unspecified osteoarthritis, unspecified site: Secondary | ICD-10-CM | POA: Insufficient documentation

## 2021-11-13 DIAGNOSIS — M19131 Post-traumatic osteoarthritis, right wrist: Secondary | ICD-10-CM | POA: Diagnosis not present

## 2021-11-13 DIAGNOSIS — M25531 Pain in right wrist: Secondary | ICD-10-CM | POA: Diagnosis not present

## 2021-11-13 DIAGNOSIS — M25431 Effusion, right wrist: Secondary | ICD-10-CM | POA: Diagnosis not present

## 2021-11-13 DIAGNOSIS — M9907 Segmental and somatic dysfunction of upper extremity: Secondary | ICD-10-CM | POA: Diagnosis not present
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
## 2021-11-23 ENCOUNTER — Ambulatory Visit (INDEPENDENT_AMBULATORY_CARE_PROVIDER_SITE_OTHER): Payer: Medicare Other | Admitting: Allergy

## 2021-11-23 ENCOUNTER — Encounter: Payer: Self-pay | Admitting: Allergy

## 2021-11-23 VITALS — BP 116/76 | HR 67 | Temp 98.4°F | Resp 16

## 2021-11-23 DIAGNOSIS — T782XXD Anaphylactic shock, unspecified, subsequent encounter: Secondary | ICD-10-CM | POA: Diagnosis not present

## 2021-11-23 DIAGNOSIS — T63481D Toxic effect of venom of other arthropod, accidental (unintentional), subsequent encounter: Secondary | ICD-10-CM | POA: Diagnosis not present

## 2021-11-23 NOTE — Patient Instructions (Addendum)
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 Continue allergy shots as per protocol. At the next injection make sure you wait 45 minutes after the injection. Have them check your blood pressure before you leave. Make sure you take zyrtec '10mg'$  the day before and the day of the injection. If you have any issues then we may have to back up a few doses.  Make sure you have both pens with you at the same time. If you have to use the Epipen - then please call 911 or go to the ER. For mild symptoms you can take over the counter antihistamines such as Benadryl and monitor symptoms closely. If symptoms worsen or if you have severe symptoms including breathing issues, throat closure, significant swelling, whole body hives, severe diarrhea and vomiting, lightheadedness then inject epinephrine and seek immediate medical care afterwards. Emergency action plan in place.  Recommend getting a medical alert bracelet/necklace regarding the bee sting allergy.  Follow up in 12 months or sooner if needed.  Our [place] office is moving in September 2023 to a new location. New address: [address] [place], [place], [place] [address] (white building). [place] office: [phone] (same phone number).
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
## 2021-11-30 ENCOUNTER — Ambulatory Visit (INDEPENDENT_AMBULATORY_CARE_PROVIDER_SITE_OTHER): Payer: Medicare Other

## 2021-11-30 DIAGNOSIS — T63481D Toxic effect of venom of other arthropod, accidental (unintentional), subsequent encounter: Secondary | ICD-10-CM

## 2021-11-30 DIAGNOSIS — T782XXD Anaphylactic shock, unspecified, subsequent encounter: Secondary | ICD-10-CM | POA: Diagnosis not present

## 2021-12-09 DIAGNOSIS — M25531 Pain in right wrist: Secondary | ICD-10-CM | POA: Diagnosis not present

## 2021-12-09 DIAGNOSIS — M180 Bilateral primary osteoarthritis of first carpometacarpal joints: Secondary | ICD-10-CM | POA: Diagnosis not present

## 2021-12-09 DIAGNOSIS — S66316A Strain of extensor muscle, fascia and tendon of right little finger at wrist and hand level, initial encounter: Secondary | ICD-10-CM | POA: Diagnosis not present

## 2021-12-09 DIAGNOSIS — M1812 Unilateral primary osteoarthritis of first carpometacarpal joint, left hand: Secondary | ICD-10-CM | POA: Diagnosis not present

## 2021-12-09 DIAGNOSIS — M13831 Other specified arthritis, right wrist: Secondary | ICD-10-CM | POA: Diagnosis not present

## 2021-12-09 DIAGNOSIS — M1811 Unilateral primary osteoarthritis of first carpometacarpal joint, right hand: Secondary | ICD-10-CM | POA: Diagnosis not present

## 2021-12-09 DIAGNOSIS — M65839 Other synovitis and tenosynovitis, unspecified forearm: Secondary | ICD-10-CM | POA: Diagnosis not present

## 2021-12-21 DIAGNOSIS — M1812 Unilateral primary osteoarthritis of first carpometacarpal joint, left hand: Secondary | ICD-10-CM | POA: Diagnosis not present

## 2021-12-21 DIAGNOSIS — S66316A Strain of extensor muscle, fascia and tendon of right little finger at wrist and hand level, initial encounter: Secondary | ICD-10-CM | POA: Diagnosis not present

## 2021-12-21 DIAGNOSIS — M25531 Pain in right wrist: Secondary | ICD-10-CM | POA: Diagnosis not present

## 2021-12-21 DIAGNOSIS — M65839 Other synovitis and tenosynovitis, unspecified forearm: Secondary | ICD-10-CM | POA: Diagnosis not present

## 2021-12-21 DIAGNOSIS — M1811 Unilateral primary osteoarthritis of first carpometacarpal joint, right hand: Secondary | ICD-10-CM | POA: Diagnosis not present

## 2021-12-21 DIAGNOSIS — M13831 Other specified arthritis, right wrist: Secondary | ICD-10-CM | POA: Diagnosis not present

## 2022-01-04 ENCOUNTER — Ambulatory Visit: Payer: Medicare Other

## 2022-01-12 ENCOUNTER — Ambulatory Visit (INDEPENDENT_AMBULATORY_CARE_PROVIDER_SITE_OTHER): Payer: Medicare Other

## 2022-01-12 DIAGNOSIS — T782XXD Anaphylactic shock, unspecified, subsequent encounter: Secondary | ICD-10-CM

## 2022-01-12 DIAGNOSIS — T63481D Toxic effect of venom of other arthropod, accidental (unintentional), subsequent encounter: Secondary | ICD-10-CM

## 2022-01-18 ENCOUNTER — Encounter (INDEPENDENT_AMBULATORY_CARE_PROVIDER_SITE_OTHER): Payer: Self-pay

## 2022-01-18 DIAGNOSIS — H35373 Puckering of macula, bilateral: Secondary | ICD-10-CM | POA: Diagnosis not present

## 2022-01-18 DIAGNOSIS — H10503 Unspecified blepharoconjunctivitis, bilateral: Secondary | ICD-10-CM | POA: Diagnosis not present

## 2022-01-18 DIAGNOSIS — H43392 Other vitreous opacities, left eye: Secondary | ICD-10-CM | POA: Diagnosis not present

## 2022-01-18 DIAGNOSIS — H43821 Vitreomacular adhesion, right eye: Secondary | ICD-10-CM | POA: Diagnosis not present

## 2022-01-18 DIAGNOSIS — H35033 Hypertensive retinopathy, bilateral: Secondary | ICD-10-CM | POA: Diagnosis not present

## 2022-01-18 DIAGNOSIS — H401132 Primary open-angle glaucoma, bilateral, moderate stage: Secondary | ICD-10-CM | POA: Diagnosis not present

## 2022-01-18 DIAGNOSIS — H16223 Keratoconjunctivitis sicca, not specified as Sjogren's, bilateral: Secondary | ICD-10-CM | POA: Diagnosis not present

## 2022-01-19 ENCOUNTER — Ambulatory Visit (INDEPENDENT_AMBULATORY_CARE_PROVIDER_SITE_OTHER): Payer: Medicare Other | Admitting: Ophthalmology

## 2022-01-19 ENCOUNTER — Encounter (INDEPENDENT_AMBULATORY_CARE_PROVIDER_SITE_OTHER): Payer: Self-pay | Admitting: Ophthalmology

## 2022-01-19 DIAGNOSIS — H43822 Vitreomacular adhesion, left eye: Secondary | ICD-10-CM

## 2022-01-19 DIAGNOSIS — H35371 Puckering of macula, right eye: Secondary | ICD-10-CM | POA: Diagnosis not present

## 2022-01-19 NOTE — Patient Instructions (Signed)
Patient confirmed to have discontinued the operative medications placed for surgery right eye.

## 2022-01-19 NOTE — Assessment & Plan Note (Signed)
Persistent OS no change since May.  PVD has not developed as yet.  Continue observe no foveal distortion
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
## 2022-02-05 DIAGNOSIS — M65831 Other synovitis and tenosynovitis, right forearm: Secondary | ICD-10-CM | POA: Diagnosis not present

## 2022-02-05 DIAGNOSIS — M25831 Other specified joint disorders, right wrist: Secondary | ICD-10-CM | POA: Diagnosis not present

## 2022-02-05 DIAGNOSIS — M65841 Other synovitis and tenosynovitis, right hand: Secondary | ICD-10-CM | POA: Diagnosis not present

## 2022-02-05 DIAGNOSIS — M66231 Spontaneous rupture of extensor tendons, right forearm: Secondary | ICD-10-CM | POA: Diagnosis not present

## 2022-02-09 ENCOUNTER — Ambulatory Visit: Payer: Medicare Other

## 2022-02-10 DIAGNOSIS — U071 COVID-19: Secondary | ICD-10-CM | POA: Diagnosis not present

## 2022-02-22 DIAGNOSIS — M25641 Stiffness of right hand, not elsewhere classified: Secondary | ICD-10-CM | POA: Diagnosis not present

## 2022-02-22 DIAGNOSIS — Z4789 Encounter for other orthopedic aftercare: Secondary | ICD-10-CM | POA: Diagnosis not present

## 2022-02-23 ENCOUNTER — Ambulatory Visit (INDEPENDENT_AMBULATORY_CARE_PROVIDER_SITE_OTHER): Payer: Medicare Other | Admitting: *Deleted

## 2022-02-23 DIAGNOSIS — T63481D Toxic effect of venom of other arthropod, accidental (unintentional), subsequent encounter: Secondary | ICD-10-CM

## 2022-02-23 DIAGNOSIS — T782XXD Anaphylactic shock, unspecified, subsequent encounter: Secondary | ICD-10-CM | POA: Diagnosis not present

## 2022-03-02 DIAGNOSIS — M25641 Stiffness of right hand, not elsewhere classified: Secondary | ICD-10-CM | POA: Diagnosis not present

## 2022-03-15 DIAGNOSIS — Z4789 Encounter for other orthopedic aftercare: Secondary | ICD-10-CM | POA: Diagnosis not present

## 2022-03-15 DIAGNOSIS — M25641 Stiffness of right hand, not elsewhere classified: Secondary | ICD-10-CM | POA: Diagnosis not present

## 2022-03-23 DIAGNOSIS — D2271 Melanocytic nevi of right lower limb, including hip: Secondary | ICD-10-CM | POA: Diagnosis not present

## 2022-03-23 DIAGNOSIS — L821 Other seborrheic keratosis: Secondary | ICD-10-CM | POA: Diagnosis not present

## 2022-03-23 DIAGNOSIS — D2372 Other benign neoplasm of skin of left lower limb, including hip: Secondary | ICD-10-CM | POA: Diagnosis not present

## 2022-03-23 DIAGNOSIS — L570 Actinic keratosis: Secondary | ICD-10-CM | POA: Diagnosis not present

## 2022-03-23 DIAGNOSIS — D2272 Melanocytic nevi of left lower limb, including hip: Secondary | ICD-10-CM | POA: Diagnosis not present

## 2022-03-24 ENCOUNTER — Ambulatory Visit (INDEPENDENT_AMBULATORY_CARE_PROVIDER_SITE_OTHER): Payer: Medicare Other

## 2022-03-24 DIAGNOSIS — Z91038 Other insect allergy status: Secondary | ICD-10-CM | POA: Diagnosis not present

## 2022-03-26 DIAGNOSIS — M25641 Stiffness of right hand, not elsewhere classified: Secondary | ICD-10-CM | POA: Diagnosis not present

## 2022-03-29 DIAGNOSIS — M25641 Stiffness of right hand, not elsewhere classified: Secondary | ICD-10-CM | POA: Diagnosis not present

## 2022-04-06 DIAGNOSIS — M25641 Stiffness of right hand, not elsewhere classified: Secondary | ICD-10-CM | POA: Diagnosis not present

## 2022-04-06 DIAGNOSIS — R748 Abnormal levels of other serum enzymes: Secondary | ICD-10-CM | POA: Diagnosis not present

## 2022-04-08 DIAGNOSIS — M25641 Stiffness of right hand, not elsewhere classified: Secondary | ICD-10-CM | POA: Diagnosis not present

## 2022-04-08 DIAGNOSIS — Z4789 Encounter for other orthopedic aftercare: Secondary | ICD-10-CM | POA: Diagnosis not present

## 2022-04-14 DIAGNOSIS — M25641 Stiffness of right hand, not elsewhere classified: Secondary | ICD-10-CM | POA: Diagnosis not present

## 2022-04-21 ENCOUNTER — Ambulatory Visit: Payer: Medicare Other

## 2022-04-23 ENCOUNTER — Ambulatory Visit (INDEPENDENT_AMBULATORY_CARE_PROVIDER_SITE_OTHER): Payer: Medicare Other

## 2022-04-23 DIAGNOSIS — Z91038 Other insect allergy status: Secondary | ICD-10-CM

## 2022-04-29 DIAGNOSIS — M25641 Stiffness of right hand, not elsewhere classified: Secondary | ICD-10-CM | POA: Diagnosis not present

## 2022-05-03 DIAGNOSIS — M25641 Stiffness of right hand, not elsewhere classified: Secondary | ICD-10-CM | POA: Diagnosis not present

## 2022-05-10 DIAGNOSIS — Z4789 Encounter for other orthopedic aftercare: Secondary | ICD-10-CM | POA: Diagnosis not present

## 2022-05-10 DIAGNOSIS — M25641 Stiffness of right hand, not elsewhere classified: Secondary | ICD-10-CM | POA: Diagnosis not present

## 2022-05-18 DIAGNOSIS — M25641 Stiffness of right hand, not elsewhere classified: Secondary | ICD-10-CM | POA: Diagnosis not present

## 2022-05-19 DIAGNOSIS — H401132 Primary open-angle glaucoma, bilateral, moderate stage: Secondary | ICD-10-CM | POA: Diagnosis not present

## 2022-05-19 DIAGNOSIS — H43392 Other vitreous opacities, left eye: Secondary | ICD-10-CM | POA: Diagnosis not present

## 2022-05-19 DIAGNOSIS — H10503 Unspecified blepharoconjunctivitis, bilateral: Secondary | ICD-10-CM | POA: Diagnosis not present

## 2022-05-19 DIAGNOSIS — H35033 Hypertensive retinopathy, bilateral: Secondary | ICD-10-CM | POA: Diagnosis not present

## 2022-05-20 DIAGNOSIS — M25641 Stiffness of right hand, not elsewhere classified: Secondary | ICD-10-CM | POA: Diagnosis not present

## 2022-05-21 ENCOUNTER — Ambulatory Visit (INDEPENDENT_AMBULATORY_CARE_PROVIDER_SITE_OTHER): Payer: Medicare Other

## 2022-05-21 DIAGNOSIS — Z91038 Other insect allergy status: Secondary | ICD-10-CM

## 2022-05-26 DIAGNOSIS — M25641 Stiffness of right hand, not elsewhere classified: Secondary | ICD-10-CM | POA: Diagnosis not present

## 2022-06-02 DIAGNOSIS — M25641 Stiffness of right hand, not elsewhere classified: Secondary | ICD-10-CM | POA: Diagnosis not present

## 2022-06-07 ENCOUNTER — Other Ambulatory Visit: Payer: Self-pay | Admitting: Sports Medicine

## 2022-06-07 ENCOUNTER — Ambulatory Visit
Admission: RE | Admit: 2022-06-07 | Discharge: 2022-06-07 | Disposition: A | Discharge status: Home / Self Care | Payer: Medicare Other | Source: Ambulatory Visit | Attending: Sports Medicine | Admitting: Sports Medicine

## 2022-06-07 DIAGNOSIS — M25511 Pain in right shoulder: Secondary | ICD-10-CM | POA: Diagnosis not present

## 2022-06-07 DIAGNOSIS — S42101A Fracture of unspecified part of scapula, right shoulder, initial encounter for closed fracture: Secondary | ICD-10-CM | POA: Diagnosis not present

## 2022-06-07 DIAGNOSIS — S43001A Unspecified subluxation of right shoulder joint, initial encounter: Secondary | ICD-10-CM | POA: Diagnosis not present

## 2022-06-07 DIAGNOSIS — S42021A Displaced fracture of shaft of right clavicle, initial encounter for closed fracture: Secondary | ICD-10-CM | POA: Diagnosis not present

## 2022-06-07 DIAGNOSIS — M25641 Stiffness of right hand, not elsewhere classified: Secondary | ICD-10-CM | POA: Diagnosis not present

## 2022-06-17 ENCOUNTER — Ambulatory Visit: Payer: Medicare Other

## 2022-06-21 ENCOUNTER — Ambulatory Visit: Payer: Medicare Other

## 2022-06-21 ENCOUNTER — Ambulatory Visit (INDEPENDENT_AMBULATORY_CARE_PROVIDER_SITE_OTHER): Payer: Medicare Other | Admitting: *Deleted

## 2022-06-21 DIAGNOSIS — M13831 Other specified arthritis, right wrist: Secondary | ICD-10-CM | POA: Diagnosis not present

## 2022-06-21 DIAGNOSIS — Z91038 Other insect allergy status: Secondary | ICD-10-CM | POA: Diagnosis not present

## 2022-06-21 DIAGNOSIS — M65831 Other synovitis and tenosynovitis, right forearm: Secondary | ICD-10-CM | POA: Diagnosis not present

## 2022-06-21 DIAGNOSIS — S66316A Strain of extensor muscle, fascia and tendon of right little finger at wrist and hand level, initial encounter: Secondary | ICD-10-CM | POA: Diagnosis not present

## 2022-07-06 DIAGNOSIS — S42101D Fracture of unspecified part of scapula, right shoulder, subsequent encounter for fracture with routine healing: Secondary | ICD-10-CM | POA: Diagnosis not present

## 2022-07-06 DIAGNOSIS — M25511 Pain in right shoulder: Secondary | ICD-10-CM | POA: Diagnosis not present

## 2022-07-19 ENCOUNTER — Ambulatory Visit (INDEPENDENT_AMBULATORY_CARE_PROVIDER_SITE_OTHER): Payer: Medicare Other | Admitting: *Deleted

## 2022-07-19 DIAGNOSIS — M810 Age-related osteoporosis without current pathological fracture: Secondary | ICD-10-CM | POA: Diagnosis not present

## 2022-07-19 DIAGNOSIS — Z91038 Other insect allergy status: Secondary | ICD-10-CM

## 2022-07-29 DIAGNOSIS — R7303 Prediabetes: Secondary | ICD-10-CM | POA: Diagnosis not present

## 2022-07-29 DIAGNOSIS — H9190 Unspecified hearing loss, unspecified ear: Secondary | ICD-10-CM | POA: Diagnosis not present

## 2022-07-29 DIAGNOSIS — G4700 Insomnia, unspecified: Secondary | ICD-10-CM | POA: Diagnosis not present

## 2022-07-29 DIAGNOSIS — Z1211 Encounter for screening for malignant neoplasm of colon: Secondary | ICD-10-CM | POA: Diagnosis not present

## 2022-07-29 DIAGNOSIS — N400 Enlarged prostate without lower urinary tract symptoms: Secondary | ICD-10-CM | POA: Diagnosis not present

## 2022-07-29 DIAGNOSIS — E7800 Pure hypercholesterolemia, unspecified: Secondary | ICD-10-CM | POA: Diagnosis not present

## 2022-07-29 DIAGNOSIS — Z23 Encounter for immunization: Secondary | ICD-10-CM | POA: Diagnosis not present

## 2022-07-29 DIAGNOSIS — I10 Essential (primary) hypertension: Secondary | ICD-10-CM | POA: Diagnosis not present

## 2022-07-29 DIAGNOSIS — Z0000 Encounter for general adult medical examination without abnormal findings: Secondary | ICD-10-CM | POA: Diagnosis not present

## 2022-07-29 DIAGNOSIS — I700 Atherosclerosis of aorta: Secondary | ICD-10-CM | POA: Diagnosis not present

## 2022-07-29 DIAGNOSIS — Z1331 Encounter for screening for depression: Secondary | ICD-10-CM | POA: Diagnosis not present

## 2022-07-29 DIAGNOSIS — N3281 Overactive bladder: Secondary | ICD-10-CM | POA: Diagnosis not present

## 2022-07-30 DIAGNOSIS — M818 Other osteoporosis without current pathological fracture: Secondary | ICD-10-CM | POA: Diagnosis not present

## 2022-07-30 DIAGNOSIS — M25532 Pain in left wrist: Secondary | ICD-10-CM | POA: Diagnosis not present

## 2022-07-30 DIAGNOSIS — S42101S Fracture of unspecified part of scapula, right shoulder, sequela: Secondary | ICD-10-CM | POA: Diagnosis not present

## 2022-08-16 ENCOUNTER — Ambulatory Visit (INDEPENDENT_AMBULATORY_CARE_PROVIDER_SITE_OTHER): Payer: Medicare Other

## 2022-08-16 DIAGNOSIS — Z91038 Other insect allergy status: Secondary | ICD-10-CM

## 2022-09-13 ENCOUNTER — Ambulatory Visit (INDEPENDENT_AMBULATORY_CARE_PROVIDER_SITE_OTHER): Payer: Medicare Other | Admitting: *Deleted

## 2022-09-13 DIAGNOSIS — Z91038 Other insect allergy status: Secondary | ICD-10-CM

## 2022-09-24 DIAGNOSIS — H401132 Primary open-angle glaucoma, bilateral, moderate stage: Secondary | ICD-10-CM | POA: Diagnosis not present

## 2022-09-24 DIAGNOSIS — H43821 Vitreomacular adhesion, right eye: Secondary | ICD-10-CM | POA: Diagnosis not present

## 2022-09-24 DIAGNOSIS — H26491 Other secondary cataract, right eye: Secondary | ICD-10-CM | POA: Diagnosis not present

## 2022-09-24 DIAGNOSIS — H35033 Hypertensive retinopathy, bilateral: Secondary | ICD-10-CM | POA: Diagnosis not present

## 2022-09-24 DIAGNOSIS — H35373 Puckering of macula, bilateral: Secondary | ICD-10-CM | POA: Diagnosis not present

## 2022-09-24 DIAGNOSIS — H43392 Other vitreous opacities, left eye: Secondary | ICD-10-CM | POA: Diagnosis not present

## 2022-09-27 DIAGNOSIS — H26491 Other secondary cataract, right eye: Secondary | ICD-10-CM | POA: Diagnosis not present

## 2022-09-30 ENCOUNTER — Inpatient Hospital Stay (HOSPITAL_COMMUNITY)
Admission: EM | Admit: 2022-09-30 | Discharge: 2022-10-02 | DRG: 281 | Disposition: A | Discharge status: Home / Self Care | Payer: Medicare Other | Attending: Cardiology | Admitting: Cardiology

## 2022-09-30 ENCOUNTER — Encounter (HOSPITAL_COMMUNITY): Payer: Self-pay

## 2022-09-30 ENCOUNTER — Emergency Department (HOSPITAL_COMMUNITY): Payer: Medicare Other

## 2022-09-30 ENCOUNTER — Other Ambulatory Visit: Payer: Self-pay

## 2022-09-30 DIAGNOSIS — Z79899 Other long term (current) drug therapy: Secondary | ICD-10-CM | POA: Diagnosis not present

## 2022-09-30 DIAGNOSIS — N400 Enlarged prostate without lower urinary tract symptoms: Secondary | ICD-10-CM | POA: Diagnosis present

## 2022-09-30 DIAGNOSIS — Z888 Allergy status to other drugs, medicaments and biological substances status: Secondary | ICD-10-CM | POA: Diagnosis not present

## 2022-09-30 DIAGNOSIS — I319 Disease of pericardium, unspecified: Secondary | ICD-10-CM | POA: Diagnosis not present

## 2022-09-30 DIAGNOSIS — Z91048 Other nonmedicinal substance allergy status: Secondary | ICD-10-CM | POA: Diagnosis not present

## 2022-09-30 DIAGNOSIS — N3281 Overactive bladder: Secondary | ICD-10-CM | POA: Diagnosis present

## 2022-09-30 DIAGNOSIS — E7800 Pure hypercholesterolemia, unspecified: Secondary | ICD-10-CM | POA: Diagnosis present

## 2022-09-30 DIAGNOSIS — R079 Chest pain, unspecified: Secondary | ICD-10-CM | POA: Diagnosis not present

## 2022-09-30 DIAGNOSIS — Z7982 Long term (current) use of aspirin: Secondary | ICD-10-CM | POA: Diagnosis not present

## 2022-09-30 DIAGNOSIS — Z91030 Bee allergy status: Secondary | ICD-10-CM | POA: Diagnosis not present

## 2022-09-30 DIAGNOSIS — R011 Cardiac murmur, unspecified: Secondary | ICD-10-CM | POA: Diagnosis not present

## 2022-09-30 DIAGNOSIS — I514 Myocarditis, unspecified: Secondary | ICD-10-CM | POA: Diagnosis present

## 2022-09-30 DIAGNOSIS — Z8249 Family history of ischemic heart disease and other diseases of the circulatory system: Secondary | ICD-10-CM

## 2022-09-30 DIAGNOSIS — I2510 Atherosclerotic heart disease of native coronary artery without angina pectoris: Secondary | ICD-10-CM | POA: Diagnosis present

## 2022-09-30 DIAGNOSIS — R0789 Other chest pain: Secondary | ICD-10-CM | POA: Diagnosis not present

## 2022-09-30 DIAGNOSIS — I10 Essential (primary) hypertension: Secondary | ICD-10-CM | POA: Diagnosis present

## 2022-09-30 DIAGNOSIS — I2089 Other forms of angina pectoris: Secondary | ICD-10-CM | POA: Diagnosis not present

## 2022-09-30 DIAGNOSIS — I252 Old myocardial infarction: Secondary | ICD-10-CM | POA: Diagnosis not present

## 2022-09-30 DIAGNOSIS — I700 Atherosclerosis of aorta: Secondary | ICD-10-CM | POA: Diagnosis not present

## 2022-09-30 DIAGNOSIS — I214 Non-ST elevation (NSTEMI) myocardial infarction: Principal | ICD-10-CM | POA: Insufficient documentation

## 2022-09-30 DIAGNOSIS — J439 Emphysema, unspecified: Secondary | ICD-10-CM | POA: Diagnosis not present

## 2022-09-30 DIAGNOSIS — R7989 Other specified abnormal findings of blood chemistry: Secondary | ICD-10-CM | POA: Insufficient documentation

## 2022-09-30 LAB — CBC
HCT: 45.7 % (ref 39.0–52.0)
Hemoglobin: 15.1 g/dL (ref 13.0–17.0)
MCH: 32.6 pg (ref 26.0–34.0)
MCHC: 33 g/dL (ref 30.0–36.0)
MCV: 98.7 fL (ref 80.0–100.0)
Platelets: 204 10*3/uL (ref 150–400)
RBC: 4.63 MIL/uL (ref 4.22–5.81)
RDW: 12.8 % (ref 11.5–15.5)
WBC: 9 10*3/uL (ref 4.0–10.5)
nRBC: 0 % (ref 0.0–0.2)

## 2022-09-30 LAB — BASIC METABOLIC PANEL
Anion gap: 9 (ref 5–15)
BUN: 20 mg/dL (ref 8–23)
CO2: 25 mmol/L (ref 22–32)
Calcium: 9.2 mg/dL (ref 8.9–10.3)
Chloride: 103 mmol/L (ref 98–111)
Creatinine, Ser: 1.13 mg/dL (ref 0.61–1.24)
GFR, Estimated: >60 mL/min (ref >60)
Glucose, Bld: 119 mg/dL — ABNORMAL HIGH (ref 70–99)
Potassium: 4.5 mmol/L (ref 3.5–5.1)
Sodium: 137 mmol/L (ref 135–145)

## 2022-09-30 LAB — TROPONIN I (HIGH SENSITIVITY)
Troponin I (High Sensitivity): 180 ng/L (ref <18)
Troponin I (High Sensitivity): 916 ng/L (ref <18)

## 2022-09-30 MED ORDER — TAMSULOSIN HCL 0.4 MG PO CAPS
0.4000 mg | ORAL_CAPSULE | Freq: Every day | ORAL | Status: DC
  Administered 2022-10-01 – 2022-10-02 (×2): 0.4 mg via ORAL
  Filled 2022-09-30 (×2): qty 1

## 2022-09-30 MED ORDER — ACETAMINOPHEN 325 MG PO TABS: 650.0000 mg | ORAL_TABLET | ORAL | Status: DC | PRN

## 2022-09-30 MED ORDER — DIPHENHYDRAMINE HCL 25 MG PO CAPS: 25.0000 mg | ORAL_CAPSULE | Freq: Four times a day (QID) | ORAL | Status: DC | PRN

## 2022-09-30 MED ORDER — ASPIRIN 81 MG PO TBEC
81.0000 mg | DELAYED_RELEASE_TABLET | Freq: Every day | ORAL | Status: DC
  Administered 2022-10-01 – 2022-10-02 (×2): 81 mg via ORAL
  Filled 2022-09-30 (×2): qty 1

## 2022-09-30 MED ORDER — ASPIRIN 81 MG PO TBEC: 81.0000 mg | DELAYED_RELEASE_TABLET | Freq: Every day | ORAL | Status: DC

## 2022-09-30 MED ORDER — ATORVASTATIN CALCIUM 40 MG PO TABS: 40.0000 mg | ORAL_TABLET | Freq: Every day | ORAL | Status: DC

## 2022-09-30 MED ORDER — IRBESARTAN 150 MG PO TABS
300.0000 mg | ORAL_TABLET | Freq: Every day | ORAL | Status: DC
  Administered 2022-10-01 – 2022-10-02 (×2): 300 mg via ORAL
  Filled 2022-09-30: qty 2
  Filled 2022-09-30: qty 1

## 2022-09-30 MED ORDER — NITROGLYCERIN 0.4 MG SL SUBL: 0.4000 mg | SUBLINGUAL_TABLET | SUBLINGUAL | Status: DC | PRN

## 2022-09-30 MED ORDER — OXYBUTYNIN CHLORIDE 5 MG PO TABS
5.0000 mg | ORAL_TABLET | Freq: Three times a day (TID) | ORAL | Status: DC
  Administered 2022-09-30 – 2022-10-02 (×5): 5 mg via ORAL
  Filled 2022-09-30 (×5): qty 1

## 2022-09-30 MED ORDER — ONDANSETRON HCL 4 MG/2ML IJ SOLN: 4.0000 mg | Freq: Four times a day (QID) | INTRAMUSCULAR | Status: DC | PRN

## 2022-09-30 MED ORDER — IOHEXOL 350 MG/ML SOLN
100.0000 mL | Freq: Once | INTRAVENOUS | Status: AC | PRN
  Administered 2022-09-30: 100 mL via INTRAVENOUS

## 2022-09-30 MED ORDER — SODIUM CHLORIDE 0.9 % IV SOLN: INTRAVENOUS | Status: AC

## 2022-09-30 MED ORDER — HEPARIN BOLUS VIA INFUSION
4000.0000 [IU] | Freq: Once | INTRAVENOUS | Status: AC
  Administered 2022-09-30: 4000 [IU] via INTRAVENOUS
  Filled 2022-09-30: qty 4000

## 2022-09-30 MED ORDER — METOPROLOL TARTRATE 12.5 MG HALF TABLET
12.5000 mg | ORAL_TABLET | Freq: Two times a day (BID) | ORAL | Status: DC
  Administered 2022-09-30 – 2022-10-02 (×4): 12.5 mg via ORAL
  Filled 2022-09-30 (×4): qty 1

## 2022-09-30 MED ORDER — ASPIRIN 325 MG PO TABS: 325.0000 mg | ORAL_TABLET | Freq: Once | ORAL | Status: DC

## 2022-09-30 MED ORDER — DORZOLAMIDE HCL-TIMOLOL MAL 2-0.5 % OP SOLN
1.0000 [drp] | Freq: Two times a day (BID) | OPHTHALMIC | Status: DC
  Administered 2022-09-30 – 2022-10-01 (×2): 1 [drp] via OPHTHALMIC
  Filled 2022-09-30: qty 10

## 2022-09-30 MED ORDER — HEPARIN (PORCINE) 25000 UT/250ML-% IV SOLN
1150.0000 [IU]/h | INTRAVENOUS | Status: DC
  Administered 2022-09-30: 950 [IU]/h via INTRAVENOUS
  Filled 2022-09-30: qty 250

## 2022-09-30 NOTE — ED Provider Notes (Signed)
[place] EMERGENCY DEPARTMENT AT [facility] Provider Note   CSN: [id number] Arrival date & time: 09/30/22  1856     History  Chief Complaint  Patient presents with   Chest Pain   Hypertension    Stanley Romero is a 77 y.o. male with a past medical history significant for hypertension and hyperlipidemia who presents to the ED due to chest pain that radiates to his back that started around 3 PM this afternoon.  Patient describes chest pain as a pressure-like sensation.  He notes he feels like he "swallowed something".  Denies upper abdominal pain.  Denies nausea, vomiting, and diaphoresis.  No shortness of breath.  No pleuritic nature to chest pain.  No cardiac history.  Denies history of blood clots, recent surgeries, recent long immobilizations, and hormonal treatments.  Denies lower extremity edema.   History obtained from patient and past medical records. No interpreter used during encounter.       Home Medications Prior to Admission medications   Medication Sig Start Date End Date Taking? Authorizing Provider  albuterol (VENTOLIN HFA) 108 (90 Base) MCG/ACT inhaler Inhale 1-2 puffs into the lungs every 6 (six) hours as needed for wheezing or shortness of breath (during pollen season).   Yes [provider]  aspirin EC 325 MG tablet Take 975 mg by mouth daily as needed (for chest pain).   Yes [provider]  aspirin EC 81 MG tablet Take 81 mg by mouth daily. Swallow whole.   Yes [provider]  atorvastatin (LIPITOR) 40 MG tablet Take 40 mg by mouth daily. 08/08/20  Yes [provider]  BENADRYL ALLERGY 25 MG tablet Take 25 mg by mouth every 6 (six) hours as needed for allergies.   Yes [provider]  dorzolamide-timolol (COSOPT) 22.3-6.8 MG/ML ophthalmic solution Place 1 drop into both eyes 2 (two) times daily. 01/18/21  Yes [provider]  EPINEPHrine 0.3 mg/0.3 mL IJ SOAJ injection Inject 0.3 mg into the muscle  as needed for anaphylaxis. 07/22/21  Yes Ellamae Sia, DO  oxybutynin (DITROPAN) 5 MG tablet Take 5 mg by mouth 3 (three) times daily. 01/18/21  Yes [provider]  tamsulosin (FLOMAX) 0.4 MG CAPS capsule Take 0.4 mg by mouth daily. 07/20/21  Yes [provider]  telmisartan (MICARDIS) 80 MG tablet Take 80 mg by mouth daily. 06/01/20  Yes [provider]  zolpidem (AMBIEN) 10 MG tablet Take 10 mg by mouth at bedtime.   Yes [provider]  ZYRTEC ALLERGY 10 MG tablet Take 10 mg by mouth daily as needed for allergies or rhinitis.   Yes [provider]  b complex vitamins capsule Take 1 capsule by mouth daily.    [provider]  Cholecalciferol (VITAMIN D) 50 MCG (2000 UT) tablet Take 2,000 Units by mouth daily.    [provider]  prednisoLONE acetate (PRED FORTE) 1 % ophthalmic suspension Place 1 drop into the left eye 4 (four) times daily. Patient not taking: Reported on 09/30/2022 10/28/21   Rankin, Alford Highland, MD  vitamin C (ASCORBIC ACID) 500 MG tablet Take 500 mg by mouth daily.    [provider]  vitamin E 180 MG (400 UNITS) capsule Take 400 Units by mouth daily.    [provider]  zinc gluconate 50 MG tablet Take 50 mg by mouth 2 (two) times daily.    [provider]      Allergies    Bee venom, Wasp  venom, Fesoterodine fumarate er, Other, and Pollen extract    Review of Systems   Review of Systems  Respiratory:  Negative for shortness of breath.   Cardiovascular:  Positive for chest pain. Negative for leg swelling.  Gastrointestinal:  Negative for abdominal pain.    Physical Exam Updated Vital Signs BP (!) 161/76 (BP Location: Left Arm)   Pulse 65   Temp 98.2 F (36.8 C)   Resp 13   Ht 5\' 8"  (1.727 m)   Wt 82.6 kg   SpO2 100%   BMI 27.67 kg/m  Physical Exam Vitals and nursing note reviewed.  Constitutional:      General: He is not in acute distress.    Appearance: He is not ill-appearing.   HENT:     Head: Normocephalic.  Eyes:     Pupils: Pupils are equal, round, and reactive to light.  Cardiovascular:     Rate and Rhythm: Normal rate and regular rhythm.     Pulses: Normal pulses.     Heart sounds: Normal heart sounds. No murmur heard.    No friction rub. No gallop.  Pulmonary:     Effort: Pulmonary effort is normal.     Breath sounds: Normal breath sounds.  Abdominal:     General: Abdomen is flat. There is no distension.     Palpations: Abdomen is soft.     Tenderness: There is no abdominal tenderness. There is no guarding or rebound.  Musculoskeletal:        General: Normal range of motion.     Cervical back: Neck supple.     Comments: No lower extremity edema  Skin:    General: Skin is warm and dry.  Neurological:     General: No focal deficit present.     Mental Status: He is alert.  Psychiatric:        Mood and Affect: Mood normal.        Behavior: Behavior normal.     ED Results / Procedures / Treatments   Labs (all labs ordered are listed, but only abnormal results are displayed) Labs Reviewed  BASIC METABOLIC PANEL - Abnormal; Notable for the following components:      Result Value   Glucose, Bld 119 (*)    All other components within normal limits  TROPONIN I (HIGH SENSITIVITY) - Abnormal; Notable for the following components:   Troponin I (High Sensitivity) 180 (*)    All other components within normal limits  CBC  CBC  TROPONIN I (HIGH SENSITIVITY)    EKG EKG Interpretation  Date/Time:  Thursday September 30 2022 19:08:06 EDT Ventricular Rate:  67 PR Interval:  164 QRS Duration: 92 QT Interval:  392 QTC Calculation: 414 R Axis:   29 Text Interpretation: Sinus rhythm Confirmed by Alvino Blood ([id number]) on 09/30/2022 8:12:45 PM  Radiology CT Angio Chest/Abd/Pel for Dissection W and/or Wo Contrast  Result Date: 09/30/2022 CLINICAL DATA:  Acute aortic syndrome suspected. Central chest pain radiating to back. EXAM: CT ANGIOGRAPHY CHEST,  ABDOMEN AND PELVIS TECHNIQUE: Non-contrast CT of the chest was initially obtained. Multidetector CT imaging through the chest, abdomen and pelvis was performed using the standard protocol during bolus administration of intravenous contrast. Multiplanar reconstructed images and MIPs were obtained and reviewed to evaluate the vascular anatomy. RADIATION DOSE REDUCTION: This exam was performed according to the departmental dose-optimization program which includes automated exposure control, adjustment of the mA and/or kV according to patient size and/or use of iterative reconstruction technique. CONTRAST:  OMNIPAQUE IOHEXOL 350 MG/ML SOLN COMPARISON:  08/23/2021. FINDINGS: CTA CHEST FINDINGS Cardiovascular: The heart is normal in size and there is a trace pericardial effusion. Three-vessel coronary artery calcifications are noted. There is calcification of the mitral valve annulus. There is atherosclerotic calcification of the aorta without evidence of aneurysm or dissection. The pulmonary trunk is normal in caliber. Mediastinum/Nodes: No enlarged mediastinal, hilar, or axillary lymph nodes. Thyroid gland, trachea, and esophagus demonstrate no significant findings. Lungs/Pleura: Mild pleural and parenchymal scarring is noted at the lung apices bilaterally. Minimal atelectasis is noted bilaterally. No effusion or pneumothorax. Musculoskeletal: Degenerative changes are present in the thoracic spine. There is a mildly displaced fracture of the inferior tip of the scapula on the right, new from the previous exam. Review of the MIP images confirms the above findings. CTA ABDOMEN AND PELVIS FINDINGS VASCULAR Aorta: Normal caliber aorta without aneurysm, dissection, vasculitis or significant stenosis. Aortic atherosclerosis. Celiac: Patent without evidence of aneurysm, dissection, vasculitis or significant stenosis. SMA: Patent without evidence of aneurysm, dissection, vasculitis or significant stenosis. Renals: Both  renal arteries are patent without evidence of aneurysm, dissection, vasculitis, fibromuscular dysplasia or significant stenosis. IMA: Patent without evidence of aneurysm, dissection, vasculitis or significant stenosis. Inflow: Patent without evidence of aneurysm, dissection, vasculitis or significant stenosis. Veins: No obvious venous abnormality within the limitations of this arterial phase study. Review of the MIP images confirms the above findings. NON-VASCULAR Hepatobiliary: No focal liver abnormality is seen. Status post cholecystectomy. No biliary dilatation. Pancreas: Unremarkable. No pancreatic ductal dilatation or surrounding inflammatory changes. Spleen: Normal in size without focal abnormality. Adrenals/Urinary Tract: Adrenal glands are within normal limits. The kidneys enhance symmetrically. A cyst is present in the lower pole of the right kidney. No renal calculus or hydronephrosis. The bladder is unremarkable. Stomach/Bowel: Stomach is within normal limits. Appendix is not seen. No evidence of bowel wall thickening, distention, or inflammatory changes. No free air or pneumatosis. Scattered diverticula are present along the colon without evidence of diverticulitis. Lymphatic: No abdominal or pelvic lymphadenopathy. Reproductive: The prostate gland is enlarged. Other: No abdominopelvic ascites. A fat containing inguinal hernias noted on the left. Musculoskeletal: Degenerative changes are present in the lumbar spine. No acute osseous abnormality. Review of the MIP images confirms the above findings. IMPRESSION: 1. Aortic atherosclerosis with no evidence of aneurysm or dissection. 2. Fracture of the tip of the inferior aspect of the scapula on the right. 3. Diverticulosis without diverticulitis. 4. Enlarged prostate gland. 5. Coronary artery calcifications. Electronically Signed   By: Thornell Sartorius M.D.   On: 09/30/2022 21:10   DG Chest 2 View  Result Date: 09/30/2022 CLINICAL DATA:  Chest pain  radiating to the back. EXAM: CHEST - 2 VIEW COMPARISON:  11/29/2020 FINDINGS: Heart size and pulmonary vascularity are normal. Linear scarring in the left lung base. Probable emphysematous changes in the lungs. No airspace disease or consolidation. Calcification of the mitral valve annulus. Tortuous and calcified aorta. Mediastinal contours appear intact. Degenerative changes in the spine and shoulders. Old fracture deformity of the right clavicle. Postoperative changes in the right shoulder. IMPRESSION: 1. No evidence of active pulmonary disease. 2. 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 Electronically Signed   By: Burman Nieves M.D.   On: 09/30/2022 19:46    Procedures .Critical Care  Performed by: Mannie Stabile, PA-C Authorized by: Mannie Stabile, PA-C   Critical care provider statement:    Critical care time (minutes):  34  Critical care was necessary to treat or prevent imminent or life-threatening deterioration of the following conditions:  Cardiac failure   Critical care was time spent personally by me on the following activities:  Development of treatment plan with patient or surrogate, discussions with consultants, evaluation of patient's response to treatment, examination of patient, ordering and review of laboratory studies, ordering and review of radiographic studies, ordering and performing treatments and interventions, pulse oximetry, re-evaluation of patient's condition and review of old charts   I assumed direction of critical care for this patient from another provider in my specialty: no     Care discussed with: admitting provider       Medications Ordered in ED Medications  heparin bolus via infusion 4,000 Units (has no administration in time range)  heparin ADULT infusion 100 units/mL (25000 units/263mL) (has no administration in time range)  iohexol (OMNIPAQUE) 350 MG/ML injection 100 mL (100 mLs Intravenous Contrast Given 09/30/22  2039)    ED Course/ Medical Decision Making/ A&P Clinical Course as of 09/30/22 2158  Thu Sep 30, 2022  2034 Called CT tech and asked for patient to go next to rule out dissection. Elevated troponin. Will hold off on heparin until CT scan results available. [CA]  2100 Called radiology to expedite read on dissection study. [CA]  2132 Discussed with Dr. Daphine Deutscher with cardiology who recommends admission to medicine. Start heparin. Cardiology will consult on patient in the AM and decide if he needs to be transferred to [facility]. [CA]    Clinical Course User Index [CA] Mannie Stabile, PA-C                             Medical Decision Making Amount and/or Complexity of Data Reviewed Independent Historian: friend Labs: ordered. Decision-making details documented in ED Course. Radiology: ordered and independent interpretation performed. Decision-making details documented in ED Course. ECG/medicine tests: ordered and independent interpretation performed. Decision-making details documented in ED Course.  Risk Prescription drug management. Decision regarding hospitalization.   This patient presents to the ED for concern of CP, this involves an extensive number of treatment options, and is a complaint that carries with it a high risk of complications and morbidity.  The differential diagnosis includes ACS, aortic dissection, PE, MSK, etc  77 year old male presents to the ED due to chest pain that radiates to back that started around 3 PM.  Chest pain has been constant.  Describes chest pain as a pressure-like sensation.  No cardiac history.  No history of blood clots.  Upon arrival patient afebrile, not tachycardic or hypoxic.  Patient in no acute distress.  Reassuring physical exam.  Lungs clear to auscultation bilaterally.  No lower extremity edema.  No calf tenderness.  No evidence of DVT on exam.  Dissection study ordered.  Routine labs ordered. Troponin/EKG to rule out ACS. Patient took 3 325  ASA prior to arrival. Lower suspicion for PE.  CBC unremarkable.  BMP significant for hyperglycemia 119.  Normal renal function.  No major electrolyte derangements.  Troponin elevated at 180.  Discussed with cardiology as noted above.  Patient started on heparin.  Repeat EKG with no ST changes.  Chest x-ray personally reviewed and interpreted which is negative for any acute abnormalities.  CT dissection study negative for dissection.  Does demonstrate a fracture of the tip of the inferior aspect of the scapula on the right patient notes he fell the beginning of February and is aware  of this fracture.  Elevated troponin likely secondary to NSTEMI.  9:54 PM discussed with Dr. Maisie Fus with [facility] hospitalist who agrees to admit patient.  Has PCP Lives at home Hx HTN, HL       Final Clinical Impression(s) / ED Diagnoses Final diagnoses:  NSTEMI (non-ST elevated myocardial infarction) [facility])    Rx / DC Orders ED Discharge Orders     None         Jesusita Oka 09/30/22 2158    Lonell Grandchild, MD 10/07/22 1109

## 2022-09-30 NOTE — H&P (Signed)
History and Physical    Stanley Romero ZOX:[id number] DOB: 10-Jan-1946 DOA: 09/30/2022  PCP: Stanley Else, MD (Inactive)  Patient coming from: home  I have personally briefly reviewed patient's old medical records in [facility]  Chief Complaint: Chest pain  HPI: Stanley Romero is a 77 y.o. male with medical history significant of  HLD, HTN , diverticultis who presents to ED with complaint of chest pain that started around 3pm  day of presentation. Patient describes pain as substernal pressure hat radiates to  his back . Per patient notes episode was associated with elevated BP at home as well as tongue numbness.  Patient initially presented to walk in clinic and thereafter was referred to ED. On ros patient notes no fever/chills/ diaphoresis/ n/v/d or abdominal pain.  He denies palpitations, syncope but noted mild presyncope. Currently he notes pain 2 /10  was 4/10 on arrival to ED. He notes chest pressure has been persistent.    ED Course:  Afeb bp 161/76, HR65, rr13, sat 100% EKG: sinus rhythm  not st -twave changes  Labs: Wbc: 9, hgb 15.1, plt 204,  Na 137, K 4.5, cl 103, glu 119, cr 1.13 [id number] Cxr: IMPRESSION: 1. No evidence of active pulmonary disease. 2. 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  CTA chest IMPRESSION: 1. Aortic atherosclerosis with no evidence of aneurysm or dissection. 2. Fracture of the tip of the inferior aspect of the scapula on the right. 3. Diverticulosis without diverticulitis. 4. Enlarged prostate gland. 5. Coronary artery calcifications.  TX: heparin drip  Review of Systems: As per HPI otherwise 10 point review of systems negative.   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   11-04-2021, vitrectomy and release of VMT with membrane peel    Past Surgical  History:  Procedure Laterality Date   APPENDECTOMY  1956   CHOLECYSTECTOMY N/A 06/26/2020   Procedure: LAPAROSCOPIC CHOLECYSTECTOMY;  Surgeon: Gaynelle Adu, MD;  Location: [facility];  Service: General;  Laterality: N/A;   COLON SURGERY  2001   COLONOSCOPY  2010   ENDOSCOPIC RETROGRADE CHOLANGIOPANCREATOGRAPHY (ERCP) WITH PROPOFOL N/A 03/06/2021   Procedure: ENDOSCOPIC RETROGRADE CHOLANGIOPANCREATOGRAPHY (ERCP) WITH PROPOFOL;  Surgeon: Kerin Salen, MD;  Location: [facility];  Service: Gastroenterology;  Laterality: N/A;   FRACTURE SURGERY Right 1959   REMOVAL OF STONES  03/06/2021   Procedure: REMOVAL OF STONES;  Surgeon: Kerin Salen, MD;  Location: [facility];  Service: Gastroenterology;;   ROTATOR CUFF REPAIR Left 2008   SPHINCTEROTOMY  03/06/2021   Procedure: Dennison Mascot;  Surgeon: Kerin Salen, MD;  Location: [facility];  Service: Gastroenterology;;     reports that he has never smoked. He has never used smokeless tobacco. He reports current alcohol use of about 24.0 standard drinks of alcohol per week. He reports that he does not use drugs.  Allergies  Allergen Reactions   Bee Venom Anaphylaxis   Wasp Venom Anaphylaxis   Fesoterodine Fumarate Er Other (See Comments)    Dry mouth   Other Other (See Comments)    Mold - Stuffy head   Pollen Extract Other (See Comments)    Stuffy nose and head    Family History  Problem Relation Age of Onset   Allergic rhinitis Mother    Heart failure Father    Allergic  rhinitis Sister    Asthma Neg Hx    Eczema Neg Hx    Urticaria Neg Hx     Prior to Admission medications   Medication Sig Start Date End Date Taking? Authorizing Provider  albuterol (VENTOLIN HFA) 108 (90 Base) MCG/ACT inhaler Inhale 1-2 puffs into the lungs every 6 (six) hours as needed for wheezing or shortness of breath (during pollen season).   Yes [provider]  aspirin EC 325 MG tablet Take 975 mg by mouth once as needed (for chest pain).   Yes  [provider]  aspirin EC 81 MG tablet Take 81 mg by mouth daily. Swallow whole.   Yes [provider]  atorvastatin (LIPITOR) 40 MG tablet Take 40 mg by mouth daily. 08/08/20  Yes [provider]  BENADRYL ALLERGY 25 MG tablet Take 25 mg by mouth every 6 (six) hours as needed for allergies.   Yes [provider]  dorzolamide-timolol (COSOPT) 22.3-6.8 MG/ML ophthalmic solution Place 1 drop into both eyes 2 (two) times daily. 01/18/21  Yes [provider]  EPINEPHrine 0.3 mg/0.3 mL IJ SOAJ injection Inject 0.3 mg into the muscle as needed for anaphylaxis. 07/22/21  Yes Ellamae Sia, DO  oxybutynin (DITROPAN) 5 MG tablet Take 5 mg by mouth 3 (three) times daily. 01/18/21  Yes [provider]  tamsulosin (FLOMAX) 0.4 MG CAPS capsule Take 0.4 mg by mouth daily. 07/20/21  Yes [provider]  telmisartan (MICARDIS) 80 MG tablet Take 80 mg by mouth daily. 06/01/20  Yes [provider]  zolpidem (AMBIEN) 10 MG tablet Take 10 mg by mouth at bedtime.   Yes [provider]  ZYRTEC ALLERGY 10 MG tablet Take 10 mg by mouth daily as needed for allergies or rhinitis.   Yes [provider]  b complex vitamins capsule Take 1 capsule by mouth daily. Patient not taking: Reported on 09/30/2022    [provider]  Cholecalciferol (VITAMIN D) 50 MCG (2000 UT) tablet Take 2,000 Units by mouth daily. Patient not taking: Reported on 09/30/2022    [provider]  prednisoLONE acetate (PRED FORTE) 1 % ophthalmic suspension Place 1 drop into the left eye 4 (four) times daily. Patient not taking: Reported on 09/30/2022 10/28/21   Rankin, Alford Highland, MD  vitamin C (ASCORBIC ACID) 500 MG tablet Take 500 mg by mouth daily. Patient not taking: Reported on 09/30/2022    [provider]  vitamin E 180 MG (400 UNITS) capsule Take 400 Units by mouth daily. Patient not taking: Reported on 09/30/2022    [provider]  zinc  gluconate 50 MG tablet Take 50 mg by mouth 2 (two) times daily. Patient not taking: Reported on 09/30/2022    [provider]    Physical Exam: Vitals:   09/30/22 1907 09/30/22 2100  BP: (!) 161/76 (!) 166/87  Pulse: 65 72  Resp: 13 13  Temp: 98.2 F (36.8 C)   SpO2: 100% 100%  Weight: 82.6 kg   Height: 5\' 8"  (1.727 m)     Constitutional: NAD, calm, comfortable Vitals:   09/30/22 1907 09/30/22 2100  BP: (!) 161/76 (!) 166/87  Pulse: 65 72  Resp: 13 13  Temp: 98.2 F (36.8 C)   SpO2: 100% 100%  Weight: 82.6 kg   Height: 5\' 8"  (1.727 m)    Eyes: PERRL, lids and conjunctivae normal ENMT: Mucous membranes are moist. Posterior pharynx clear of any exudate or lesions.Normal dentition.  Neck: normal, supple,  no masses, no thyromegaly Respiratory: clear to auscultation bilaterally, no wheezing, no crackles. Normal respiratory effort. No accessory muscle use.  Cardiovascular: Regular rate and rhythm, no murmurs / rubs / gallops. No extremity edema. 2+ pedal pulses. No carotid bruits.  Abdomen: no tenderness, no masses palpated. No hepatosplenomegaly. Bowel sounds positive.  Musculoskeletal: no clubbing / cyanosis. No joint deformity upper and lower extremities. Good ROM, no contractures. Normal muscle tone.  Skin: no rashes, lesions, ulcers. No induration Neurologic: CN 2-12 grossly intact. Sensation intact, Strength 5/5 in all 4.  Psychiatric: Normal judgment and insight. Alert and oriented x 3. Normal mood.    Labs on Admission: I have personally reviewed following labs and imaging studies  CBC: Recent Labs  Lab 09/30/22 1910  WBC 9.0  HGB 15.1  HCT 45.7  MCV 98.7  PLT 204   Basic Metabolic Panel: Recent Labs  Lab 09/30/22 1910  NA 137  K 4.5  CL 103  CO2 25  GLUCOSE 119*  BUN 20  CREATININE 1.13  CALCIUM 9.2   GFR: Estimated Creatinine Clearance: 57.4 mL/min (by C-G formula based on SCr of 1.13 mg/dL). Liver Function Tests: No results for  input(s): "AST", "ALT", "ALKPHOS", "BILITOT", "PROT", "ALBUMIN" in the last 168 hours. No results for input(s): "LIPASE", "AMYLASE" in the last 168 hours. No results for input(s): "AMMONIA" in the last 168 hours. Coagulation Profile: No results for input(s): "INR", "PROTIME" in the last 168 hours. Cardiac Enzymes: No results for input(s): "CKTOTAL", "CKMB", "CKMBINDEX", "TROPONINI" in the last 168 hours. BNP (last 3 results) No results for input(s): "PROBNP" in the last 8760 hours. HbA1C: No results for input(s): "HGBA1C" in the last 72 hours. CBG: No results for input(s): "GLUCAP" in the last 168 hours. Lipid Profile: No results for input(s): "CHOL", "HDL", "LDLCALC", "TRIG", "CHOLHDL", "LDLDIRECT" in the last 72 hours. Thyroid Function Tests: No results for input(s): "TSH", "T4TOTAL", "FREET4", "T3FREE", "THYROIDAB" in the last 72 hours. Anemia Panel: No results for input(s): "VITAMINB12", "FOLATE", "FERRITIN", "TIBC", "IRON", "RETICCTPCT" in the last 72 hours. Urine analysis:    Component Value Date/Time   COLORURINE AMBER (A) 08/23/2021 0650   APPEARANCEUR CLOUDY (A) 08/23/2021 0650   LABSPEC 1.018 08/23/2021 0650   PHURINE 7.0 08/23/2021 0650   GLUCOSEU NEGATIVE 08/23/2021 0650   HGBUR NEGATIVE 08/23/2021 0650   BILIRUBINUR NEGATIVE 08/23/2021 0650   KETONESUR 20 (A) 08/23/2021 0650   PROTEINUR 30 (A) 08/23/2021 0650   NITRITE NEGATIVE 08/23/2021 0650   LEUKOCYTESUR NEGATIVE 08/23/2021 0650    Radiological Exams on Admission: CT Angio Chest/Abd/Pel for Dissection W and/or Wo Contrast  Result Date: 09/30/2022 CLINICAL DATA:  Acute aortic syndrome suspected. Central chest pain radiating to back. EXAM: CT ANGIOGRAPHY CHEST, ABDOMEN AND PELVIS TECHNIQUE: Non-contrast CT of the chest was initially obtained. Multidetector CT imaging through the chest, abdomen and pelvis was performed using the standard protocol during bolus administration of intravenous contrast. Multiplanar  reconstructed images and MIPs were obtained and reviewed to evaluate the vascular anatomy. RADIATION DOSE REDUCTION: This exam was performed according to the departmental dose-optimization program which includes automated exposure control, adjustment of the mA and/or kV according to patient size and/or use of iterative reconstruction technique. CONTRAST:  OMNIPAQUE IOHEXOL 350 MG/ML SOLN COMPARISON:  08/23/2021. FINDINGS: CTA CHEST FINDINGS Cardiovascular: The heart is normal in size and there is a trace pericardial effusion. Three-vessel coronary artery calcifications are noted. There is calcification of the mitral valve annulus. There is atherosclerotic calcification of the aorta without evidence  of aneurysm or dissection. The pulmonary trunk is normal in caliber. Mediastinum/Nodes: No enlarged mediastinal, hilar, or axillary lymph nodes. Thyroid gland, trachea, and esophagus demonstrate no significant findings. Lungs/Pleura: Mild pleural and parenchymal scarring is noted at the lung apices bilaterally. Minimal atelectasis is noted bilaterally. No effusion or pneumothorax. Musculoskeletal: Degenerative changes are present in the thoracic spine. There is a mildly displaced fracture of the inferior tip of the scapula on the right, new from the previous exam. Review of the MIP images confirms the above findings. CTA ABDOMEN AND PELVIS FINDINGS VASCULAR Aorta: Normal caliber aorta without aneurysm, dissection, vasculitis or significant stenosis. Aortic atherosclerosis. Celiac: Patent without evidence of aneurysm, dissection, vasculitis or significant stenosis. SMA: Patent without evidence of aneurysm, dissection, vasculitis or significant stenosis. Renals: Both renal arteries are patent without evidence of aneurysm, dissection, vasculitis, fibromuscular dysplasia or significant stenosis. IMA: Patent without evidence of aneurysm, dissection, vasculitis or significant stenosis. Inflow: Patent without evidence of  aneurysm, dissection, vasculitis or significant stenosis. Veins: No obvious venous abnormality within the limitations of this arterial phase study. Review of the MIP images confirms the above findings. NON-VASCULAR Hepatobiliary: No focal liver abnormality is seen. Status post cholecystectomy. No biliary dilatation. Pancreas: Unremarkable. No pancreatic ductal dilatation or surrounding inflammatory changes. Spleen: Normal in size without focal abnormality. Adrenals/Urinary Tract: Adrenal glands are within normal limits. The kidneys enhance symmetrically. A cyst is present in the lower pole of the right kidney. No renal calculus or hydronephrosis. The bladder is unremarkable. Stomach/Bowel: Stomach is within normal limits. Appendix is not seen. No evidence of bowel wall thickening, distention, or inflammatory changes. No free air or pneumatosis. Scattered diverticula are present along the colon without evidence of diverticulitis. Lymphatic: No abdominal or pelvic lymphadenopathy. Reproductive: The prostate gland is enlarged. Other: No abdominopelvic ascites. A fat containing inguinal hernias noted on the left. Musculoskeletal: Degenerative changes are present in the lumbar spine. No acute osseous abnormality. Review of the MIP images confirms the above findings. IMPRESSION: 1. Aortic atherosclerosis with no evidence of aneurysm or dissection. 2. Fracture of the tip of the inferior aspect of the scapula on the right. 3. Diverticulosis without diverticulitis. 4. Enlarged prostate gland. 5. Coronary artery calcifications. Electronically Signed   By: Thornell Sartorius M.D.   On: 09/30/2022 21:10   DG Chest 2 View  Result Date: 09/30/2022 CLINICAL DATA:  Chest pain radiating to the back. EXAM: CHEST - 2 VIEW COMPARISON:  11/29/2020 FINDINGS: Heart size and pulmonary vascularity are normal. Linear scarring in the left lung base. Probable emphysematous changes in the lungs. No airspace disease or consolidation. Calcification  of the mitral valve annulus. Tortuous and calcified aorta. Mediastinal contours appear intact. Degenerative changes in the spine and shoulders. Old fracture deformity of the right clavicle. Postoperative changes in the right shoulder. IMPRESSION: 1. No evidence of active pulmonary disease. 2. 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 Electronically Signed   By: Burman Nieves M.D.   On: 09/30/2022 19:46    EKG: Independently reviewed. See above   Assessment/Plan  NSTEMI -admit to progressive care , with plans to transfer to [facility] once beds available  - CE [id number] , continue to cycle ce overnight  - s/p asa in ED and initiation of heparin drip  -start metoprolol, asa 81 daily  -continue on ARB,statin -nitro SL, morphine prn  -echo in am ,repeat EKG ,lipid panel ,A1c in am  -cardiology to see in am  -npo midnight   HTN -  resume arb   HLD -continue statin   BPH  -resume flomax  Overactive bladder  -resume ditropan   DVT prophylaxis: heparin drip Code Status: full/ as discussed per patient wishes in event of cardiac arrest  Family Communication: none at bedside Disposition Plan: patient  expected to be admitted greater than 2 midnights  Consults called: Cardiology Dr Aniceto Boss Admission status: progressive care   Lurline Del MD [facility]   If 7PM-7AM, please contact night-coverage [email or web address] Password [email or web address]  09/30/2022, 10:25 PM

## 2022-09-30 NOTE — Progress Notes (Signed)
ANTICOAGULATION CONSULT NOTE - Initial Consult  Pharmacy Consult for heparin Indication: chest pain/ACS  Allergies  Allergen Reactions   Bee Venom Anaphylaxis   Wasp Venom Anaphylaxis   Other Other (See Comments)    Mold  Stuffy head   Pollen Extract Other (See Comments)    Stuffy nose and head    Patient Measurements: Height: 5\' 8"  (172.7 cm) Weight: 82.6 kg (182 lb) IBW/kg (Calculated) : 68.4 Heparin Dosing Weight: n/a. Use total body weight of 82 kg  Vital Signs: Temp: 98.2 F (36.8 C) (06/06 1907) BP: 161/76 (06/06 1907) Pulse Rate: 65 (06/06 1907)  Labs: Recent Labs    09/30/22 1910  HGB 15.1  HCT 45.7  PLT 204  CREATININE 1.13  TROPONINIHS 180*    Estimated Creatinine Clearance: 57.4 mL/min (by C-G formula based on SCr of 1.13 mg/dL).   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  Assessment: Pt is a 89 yoM presenting with chest pain. PMH significant for HTN, HLD - prescribed aspirin PTA. Pharmacy consulted to dose heparin for ACS.   Today, 09/30/22 CBC: baseline WNL. Hgb (15.1), Plt (204)  Goal of Therapy:  Heparin level 0.3-0.7 units/ml Monitor platelets by anticoagulation protocol: Yes   Plan:  Heparin bolus of 4000 units IV once Initiate heparin infusion at 950 units/hr Check 8 hour heparin level CBC, heparin level daily Monitor for signs of bleeding  Cindi Carbon, PharmD 09/30/2022,9:29 PM

## 2022-09-30 NOTE — ED Triage Notes (Signed)
Patient reports central chest pain starting a little after 3pm radiating to back, states it feels like a tightness, also endorses high BP at home, states highest at home 175 systolic. Takes aspirin daily. Denies any cardiac problems. Reports his tongue felt numb for a couple hours as well but has went away. Was sent here from walk in clinic.

## 2022-09-30 NOTE — ED Notes (Signed)
ED TO INPATIENT HANDOFF REPORT  ED Nurse Name and Phone #:   S Name/Age/Gender Stanley Romero 77 y.o. male Room/Bed: WA22/WA22  Code Status   Code Status: Prior  Home/SNF/Other Home Patient oriented to: self, place, time, and situation Is this baseline? Yes   Triage Complete: Triage complete  Chief Complaint NSTEMI (non-ST elevated myocardial infarction) [facility]) [I21.4]  Triage Note Patient reports central chest pain starting a little after 3pm radiating to back, states it feels like a tightness, also endorses high BP at home, states highest at home 175 systolic. Takes aspirin daily. Denies any cardiac problems. Reports his tongue felt numb for a couple hours as well but has went away. Was sent here from walk in clinic.   Allergies Allergies  Allergen Reactions   Bee Venom Anaphylaxis   Wasp Venom Anaphylaxis   Fesoterodine Fumarate Er Other (See Comments)    Dry mouth   Other Other (See Comments)    Mold - Stuffy head   Pollen Extract Other (See Comments)    Stuffy nose and head    Level of Care/Admitting Diagnosis ED Disposition     ED Disposition  Admit   Condition  --   Comment  Hospital Area: [facility] [[id number]]  Level of Care: Progressive [102]  Admit to Progressive based on following criteria: CARDIOVASCULAR & THORACIC of moderate stability with acute coronary syndrome symptoms/low risk myocardial infarction/hypertensive urgency/arrhythmias/heart failure potentially compromising stability and stable post cardiovascular intervention patients.  May admit patient to [facility] or [facility] if equivalent level of care is available:: No  Covid Evaluation: Asymptomatic - no recent exposure (last 10 days) testing not required  Diagnosis: NSTEMI (non-ST elevated myocardial infarction) [facility]) [[id number]]  Admitting Physician: Lurline Del [[id number]]  Attending Physician: Lurline Del [[id number]]  Certification:: I certify this patient  will need inpatient services for at least 2 midnights  Estimated Length of Stay: 3          B Medical/Surgery History 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   11-04-2021, vitrectomy and release of VMT with membrane peel   Past Surgical History:  Procedure Laterality Date   APPENDECTOMY  1956   CHOLECYSTECTOMY N/A 06/26/2020   Procedure: LAPAROSCOPIC CHOLECYSTECTOMY;  Surgeon: Gaynelle Adu, MD;  Location: [facility];  Service: General;  Laterality: N/A;   COLON SURGERY  2001   COLONOSCOPY  2010   ENDOSCOPIC RETROGRADE CHOLANGIOPANCREATOGRAPHY (ERCP) WITH PROPOFOL N/A 03/06/2021   Procedure: ENDOSCOPIC RETROGRADE CHOLANGIOPANCREATOGRAPHY (ERCP) WITH PROPOFOL;  Surgeon: Kerin Salen, MD;  Location: [facility] ENDOSCOPY;  Service: Gastroenterology;  Laterality: N/A;   FRACTURE SURGERY Right 1959   REMOVAL OF STONES  03/06/2021   Procedure: REMOVAL OF STONES;  Surgeon: Kerin Salen, MD;  Location: [facility] ENDOSCOPY;  Service: Gastroenterology;;   ROTATOR CUFF REPAIR Left 2008   SPHINCTEROTOMY  03/06/2021   Procedure: Dennison Mascot;  Surgeon: Kerin Salen, MD;  Location: [facility];  Service: Gastroenterology;;     A IV Location/Drains/Wounds Patient Lines/Drains/Airways Status     Active Line/Drains/Airways     Name Placement date Placement time Site Days   Peripheral IV 09/30/22 18 G 1" Right Antecubital 09/30/22  1952  Antecubital  less than 1   Incision - 4 Ports Abdomen Mid;Upper Mid;Medial Right;Lateral Right;Medial 06/26/20  1035  -- 826  Intake/Output Last 24 hours No intake or output data in the 24 hours ending 09/30/22 2203  Labs/Imaging Results for orders placed or performed during the hospital encounter of 09/30/22 (from the past 48 hour(s))  Basic metabolic panel     Status: Abnormal   Collection Time: 09/30/22   7:10 PM  Result Value Ref Range   Sodium 137 135 - 145 mmol/L   Potassium 4.5 3.5 - 5.1 mmol/L   Chloride 103 98 - 111 mmol/L   CO2 25 22 - 32 mmol/L   Glucose, Bld 119 (H) 70 - 99 mg/dL    Comment: Glucose reference range applies only to samples taken after fasting for at least 8 hours.   BUN 20 8 - 23 mg/dL   Creatinine, Ser 1.61 0.61 - 1.24 mg/dL   Calcium 9.2 8.9 - 09.6 mg/dL   GFR, Estimated >04 >54 mL/min    Comment: (NOTE) Calculated using the CKD-EPI Creatinine Equation (2021)    Anion gap 9 5 - 15    Comment: Performed at [facility], [address] [place], [place] [address]  CBC     Status: None   Collection Time: 09/30/22  7:10 PM  Result Value Ref Range   WBC 9.0 4.0 - 10.5 K/uL   RBC 4.63 4.22 - 5.81 MIL/uL   Hemoglobin 15.1 13.0 - 17.0 g/dL   HCT 91.4 78.2 - 95.6 %   MCV 98.7 80.0 - 100.0 fL   MCH 32.6 26.0 - 34.0 pg   MCHC 33.0 30.0 - 36.0 g/dL   RDW 21.3 08.6 - 57.8 %   Platelets 204 150 - 400 K/uL   nRBC 0.0 0.0 - 0.2 %    Comment: Performed at [facility], [address], [place], [place] [address]  Troponin I (High Sensitivity)     Status: Abnormal   Collection Time: 09/30/22  7:10 PM  Result Value Ref Range   Troponin I (High Sensitivity) 180 (HH) <18 ng/L    Comment: CRITICAL RESULT CALLED TO, READ BACK BY AND VERIFIED WITH A. POWELL, RN (NOTE) Elevated high sensitivity troponin I (hsTnI) values and significant  changes across serial measurements may suggest ACS but many other  chronic and acute conditions are known to elevate hsTnI results.  Refer to the "Links" section for chest pain algorithms and additional  guidance. Performed at [facility], [address], [place], [place] [address]    CT Angio Chest/Abd/Pel for Dissection W and/or Wo Contrast  Result Date: 09/30/2022 CLINICAL DATA:  Acute aortic syndrome suspected. Central chest pain radiating to back. EXAM: CT ANGIOGRAPHY CHEST,  ABDOMEN AND PELVIS TECHNIQUE: Non-contrast CT of the chest was initially obtained. Multidetector CT imaging through the chest, abdomen and pelvis was performed using the standard protocol during bolus administration of intravenous contrast. Multiplanar reconstructed images and MIPs were obtained and reviewed to evaluate the vascular anatomy. RADIATION DOSE REDUCTION: This exam was performed according to the departmental dose-optimization program which includes automated exposure control, adjustment of the mA and/or kV according to patient size and/or use of iterative reconstruction technique. CONTRAST:  OMNIPAQUE IOHEXOL 350 MG/ML SOLN COMPARISON:  08/23/2021. FINDINGS: CTA CHEST FINDINGS Cardiovascular: The heart is normal in size and there is a trace pericardial effusion. Three-vessel coronary artery calcifications are noted. There is calcification of the mitral valve annulus. There is atherosclerotic calcification of the aorta without evidence of aneurysm or dissection. The pulmonary trunk is normal in caliber. Mediastinum/Nodes: No enlarged mediastinal,  hilar, or axillary lymph nodes. Thyroid gland, trachea, and esophagus demonstrate no significant findings. Lungs/Pleura: Mild pleural and parenchymal scarring is noted at the lung apices bilaterally. Minimal atelectasis is noted bilaterally. No effusion or pneumothorax. Musculoskeletal: Degenerative changes are present in the thoracic spine. There is a mildly displaced fracture of the inferior tip of the scapula on the right, new from the previous exam. Review of the MIP images confirms the above findings. CTA ABDOMEN AND PELVIS FINDINGS VASCULAR Aorta: Normal caliber aorta without aneurysm, dissection, vasculitis or significant stenosis. Aortic atherosclerosis. Celiac: Patent without evidence of aneurysm, dissection, vasculitis or significant stenosis. SMA: Patent without evidence of aneurysm, dissection, vasculitis or significant stenosis. Renals: Both  renal arteries are patent without evidence of aneurysm, dissection, vasculitis, fibromuscular dysplasia or significant stenosis. IMA: Patent without evidence of aneurysm, dissection, vasculitis or significant stenosis. Inflow: Patent without evidence of aneurysm, dissection, vasculitis or significant stenosis. Veins: No obvious venous abnormality within the limitations of this arterial phase study. Review of the MIP images confirms the above findings. NON-VASCULAR Hepatobiliary: No focal liver abnormality is seen. Status post cholecystectomy. No biliary dilatation. Pancreas: Unremarkable. No pancreatic ductal dilatation or surrounding inflammatory changes. Spleen: Normal in size without focal abnormality. Adrenals/Urinary Tract: Adrenal glands are within normal limits. The kidneys enhance symmetrically. A cyst is present in the lower pole of the right kidney. No renal calculus or hydronephrosis. The bladder is unremarkable. Stomach/Bowel: Stomach is within normal limits. Appendix is not seen. No evidence of bowel wall thickening, distention, or inflammatory changes. No free air or pneumatosis. Scattered diverticula are present along the colon without evidence of diverticulitis. Lymphatic: No abdominal or pelvic lymphadenopathy. Reproductive: The prostate gland is enlarged. Other: No abdominopelvic ascites. A fat containing inguinal hernias noted on the left. Musculoskeletal: Degenerative changes are present in the lumbar spine. No acute osseous abnormality. Review of the MIP images confirms the above findings. IMPRESSION: 1. Aortic atherosclerosis with no evidence of aneurysm or dissection. 2. Fracture of the tip of the inferior aspect of the scapula on the right. 3. Diverticulosis without diverticulitis. 4. Enlarged prostate gland. 5. Coronary artery calcifications. Electronically Signed   By: Thornell Sartorius M.D.   On: 09/30/2022 21:10   DG Chest 2 View  Result Date: 09/30/2022 CLINICAL DATA:  Chest pain  radiating to the back. EXAM: CHEST - 2 VIEW COMPARISON:  11/29/2020 FINDINGS: Heart size and pulmonary vascularity are normal. Linear scarring in the left lung base. Probable emphysematous changes in the lungs. No airspace disease or consolidation. Calcification of the mitral valve annulus. Tortuous and calcified aorta. Mediastinal contours appear intact. Degenerative changes in the spine and shoulders. Old fracture deformity of the right clavicle. Postoperative changes in the right shoulder. IMPRESSION: 1. No evidence of active pulmonary disease. 2. 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 Electronically Signed   By: Burman Nieves M.D.   On: 09/30/2022 19:46    Pending Labs Unresulted Labs (From admission, onward)     Start     Ordered   10/01/22 0500  CBC  Daily,   R      09/30/22 2139            Vitals/Pain Today's Vitals   09/30/22 1907  BP: (!) 161/76  Pulse: 65  Resp: 13  Temp: 98.2 F (36.8 C)  SpO2: 100%  Weight: 182 lb (82.6 kg)  Height: 5\' 8"  (1.727 m)  PainSc: 4     Isolation Precautions No active isolations  Medications  Medications  heparin ADULT infusion 100 units/mL (25000 units/215mL) (950 Units/hr Intravenous New Bag/Given 09/30/22 2200)  iohexol (OMNIPAQUE) 350 MG/ML injection 100 mL (100 mLs Intravenous Contrast Given 09/30/22 2039)  heparin bolus via infusion 4,000 Units (4,000 Units Intravenous Bolus from Bag 09/30/22 2200)    Mobility walks     Focused Assessments    R Recommendations: See Admitting Provider Note  Report given to:   Additional Notes:

## 2022-10-01 ENCOUNTER — Inpatient Hospital Stay (HOSPITAL_COMMUNITY): Payer: Medicare Other

## 2022-10-01 ENCOUNTER — Encounter (HOSPITAL_COMMUNITY)
Admission: EM | Disposition: A | Discharge status: Home / Self Care | Payer: Self-pay | Source: Home / Self Care | Attending: Cardiology

## 2022-10-01 DIAGNOSIS — I214 Non-ST elevation (NSTEMI) myocardial infarction: Secondary | ICD-10-CM | POA: Diagnosis not present

## 2022-10-01 HISTORY — PX: LEFT HEART CATH AND CORONARY ANGIOGRAPHY: CATH118249

## 2022-10-01 LAB — HEPATIC FUNCTION PANEL
ALT: 28 U/L (ref 0–44)
AST: 40 U/L (ref 15–41)
Albumin: 3.3 g/dL — ABNORMAL LOW (ref 3.5–5.0)
Alkaline Phosphatase: 177 U/L — ABNORMAL HIGH (ref 38–126)
Bilirubin, Direct: 0.2 mg/dL (ref 0.0–0.2)
Indirect Bilirubin: 0.6 mg/dL (ref 0.3–0.9)
Total Bilirubin: 0.8 mg/dL (ref 0.3–1.2)
Total Protein: 6.3 g/dL — ABNORMAL LOW (ref 6.5–8.1)

## 2022-10-01 LAB — ECHOCARDIOGRAM COMPLETE
Area-P 1/2: 3.63 cm2
Calc EF: 59.3 %
Height: 68 in
MV M vel: 5.19 m/s
MV Peak grad: 107.7 mmHg
MV VTI: 1.6 cm2
S' Lateral: 3.1 cm
Single Plane A2C EF: 58.5 %
Single Plane A4C EF: 63.9 %
Weight: 2912.01 oz

## 2022-10-01 LAB — BASIC METABOLIC PANEL
Anion gap: 7 (ref 5–15)
BUN: 15 mg/dL (ref 8–23)
CO2: 26 mmol/L (ref 22–32)
Calcium: 8.9 mg/dL (ref 8.9–10.3)
Chloride: 106 mmol/L (ref 98–111)
Creatinine, Ser: 1.01 mg/dL (ref 0.61–1.24)
GFR, Estimated: >60 mL/min (ref >60)
Glucose, Bld: 104 mg/dL — ABNORMAL HIGH (ref 70–99)
Potassium: 4.3 mmol/L (ref 3.5–5.1)
Sodium: 139 mmol/L (ref 135–145)

## 2022-10-01 LAB — CBC
HCT: 41.9 % (ref 39.0–52.0)
Hemoglobin: 14 g/dL (ref 13.0–17.0)
MCH: 33.3 pg (ref 26.0–34.0)
MCHC: 33.4 g/dL (ref 30.0–36.0)
MCV: 99.5 fL (ref 80.0–100.0)
Platelets: 153 10*3/uL (ref 150–400)
RBC: 4.21 MIL/uL — ABNORMAL LOW (ref 4.22–5.81)
RDW: 12.8 % (ref 11.5–15.5)
WBC: 7.1 10*3/uL (ref 4.0–10.5)
nRBC: 0 % (ref 0.0–0.2)

## 2022-10-01 LAB — BRAIN NATRIURETIC PEPTIDE: B Natriuretic Peptide: 76.8 pg/mL (ref 0.0–100.0)

## 2022-10-01 LAB — LIPID PANEL
Cholesterol: 155 mg/dL (ref 0–200)
HDL: 47 mg/dL (ref >40)
LDL Cholesterol: 85 mg/dL (ref 0–99)
Total CHOL/HDL Ratio: 3.3 RATIO
Triglycerides: 117 mg/dL (ref <150)
VLDL: 23 mg/dL (ref 0–40)

## 2022-10-01 LAB — HEPARIN LEVEL (UNFRACTIONATED)
Heparin Unfractionated: 0.2 IU/mL — ABNORMAL LOW (ref 0.30–0.70)
Heparin Unfractionated: 0.3 IU/mL (ref 0.30–0.70)

## 2022-10-01 LAB — C-REACTIVE PROTEIN: CRP: 0.7 mg/dL (ref <1.0)

## 2022-10-01 LAB — PROTIME-INR
INR: 1.1 (ref 0.8–1.2)
Prothrombin Time: 14.8 seconds (ref 11.4–15.2)

## 2022-10-01 LAB — HEMOGLOBIN A1C
Hgb A1c MFr Bld: 5.3 % (ref 4.8–5.6)
Mean Plasma Glucose: 105.41 mg/dL

## 2022-10-01 LAB — TROPONIN I (HIGH SENSITIVITY)
Troponin I (High Sensitivity): 3293 ng/L (ref <18)
Troponin I (High Sensitivity): 6602 ng/L (ref <18)

## 2022-10-01 LAB — SEDIMENTATION RATE: Sed Rate: 5 mm/hr (ref 0–16)

## 2022-10-01 SURGERY — LEFT HEART CATH AND CORONARY ANGIOGRAPHY
Anesthesia: LOCAL

## 2022-10-01 MED ORDER — SODIUM CHLORIDE 0.9 % WEIGHT BASED INFUSION: 3.0000 mL/kg/h | INTRAVENOUS | Status: DC

## 2022-10-01 MED ORDER — MIDAZOLAM HCL 2 MG/2ML IJ SOLN
INTRAMUSCULAR | Status: DC | PRN
  Administered 2022-10-01: 2 mg via INTRAVENOUS

## 2022-10-01 MED ORDER — SODIUM CHLORIDE 0.9% FLUSH: 3.0000 mL | INTRAVENOUS | Status: DC | PRN

## 2022-10-01 MED ORDER — MIDAZOLAM HCL 2 MG/2ML IJ SOLN
INTRAMUSCULAR | Status: AC
  Filled 2022-10-01: qty 2

## 2022-10-01 MED ORDER — HEPARIN (PORCINE) IN NACL 1000-0.9 UT/500ML-% IV SOLN
INTRAVENOUS | Status: DC | PRN
  Administered 2022-10-01 (×2): 500 mL

## 2022-10-01 MED ORDER — LIDOCAINE HCL (PF) 1 % IJ SOLN
INTRAMUSCULAR | Status: DC | PRN
  Administered 2022-10-01: 2 mL

## 2022-10-01 MED ORDER — HEPARIN SODIUM (PORCINE) 1000 UNIT/ML IJ SOLN
INTRAMUSCULAR | Status: DC | PRN
  Administered 2022-10-01: 4000 [IU] via INTRAVENOUS

## 2022-10-01 MED ORDER — LABETALOL HCL 5 MG/ML IV SOLN: 10.0000 mg | INTRAVENOUS | Status: AC | PRN

## 2022-10-01 MED ORDER — SODIUM CHLORIDE 0.9 % WEIGHT BASED INFUSION
3.0000 mL/kg/h | INTRAVENOUS | Status: DC
Start: 2022-10-02 — End: 2022-10-01

## 2022-10-01 MED ORDER — FENTANYL CITRATE (PF) 100 MCG/2ML IJ SOLN
INTRAMUSCULAR | Status: AC
  Filled 2022-10-01: qty 2

## 2022-10-01 MED ORDER — ONDANSETRON HCL 4 MG/2ML IJ SOLN
4.0000 mg | Freq: Four times a day (QID) | INTRAMUSCULAR | Status: DC | PRN
Start: 2022-10-01 — End: 2022-10-01

## 2022-10-01 MED ORDER — NITROGLYCERIN 2 % TD OINT
0.5000 [in_us] | TOPICAL_OINTMENT | Freq: Four times a day (QID) | TRANSDERMAL | Status: DC | PRN
  Administered 2022-10-01: 0.5 [in_us] via TOPICAL
  Filled 2022-10-01 (×2): qty 0.5

## 2022-10-01 MED ORDER — VERAPAMIL HCL 2.5 MG/ML IV SOLN
INTRAVENOUS | Status: DC | PRN
  Administered 2022-10-01: 10 mL via INTRA_ARTERIAL

## 2022-10-01 MED ORDER — ACETAMINOPHEN 325 MG PO TABS: 650.0000 mg | ORAL_TABLET | ORAL | Status: DC | PRN

## 2022-10-01 MED ORDER — SODIUM CHLORIDE 0.9 % WEIGHT BASED INFUSION
1.0000 mL/kg/h | INTRAVENOUS | Status: DC
Start: 2022-10-02 — End: 2022-10-01

## 2022-10-01 MED ORDER — SODIUM CHLORIDE 0.9 % IV SOLN: 250.0000 mL | INTRAVENOUS | Status: DC | PRN

## 2022-10-01 MED ORDER — ATORVASTATIN CALCIUM 80 MG PO TABS
80.0000 mg | ORAL_TABLET | Freq: Every day | ORAL | Status: DC
  Administered 2022-10-01: 80 mg via ORAL
  Filled 2022-10-01: qty 1

## 2022-10-01 MED ORDER — SODIUM CHLORIDE 0.9% FLUSH
3.0000 mL | Freq: Two times a day (BID) | INTRAVENOUS | Status: DC
  Administered 2022-10-02: 3 mL via INTRAVENOUS

## 2022-10-01 MED ORDER — FENTANYL CITRATE (PF) 100 MCG/2ML IJ SOLN
INTRAMUSCULAR | Status: DC | PRN
  Administered 2022-10-01: 25 ug via INTRAVENOUS

## 2022-10-01 MED ORDER — MORPHINE SULFATE (PF) 2 MG/ML IV SOLN
2.0000 mg | INTRAVENOUS | Status: DC | PRN
  Administered 2022-10-01: 2 mg via INTRAVENOUS
  Filled 2022-10-01: qty 1

## 2022-10-01 MED ORDER — SODIUM CHLORIDE 0.9 % IV SOLN: INTRAVENOUS | Status: AC

## 2022-10-01 MED ORDER — SODIUM CHLORIDE 0.9 % WEIGHT BASED INFUSION: 1.0000 mL/kg/h | INTRAVENOUS | Status: DC

## 2022-10-01 MED ORDER — PERFLUTREN LIPID MICROSPHERE
1.0000 mL | INTRAVENOUS | Status: AC | PRN
  Administered 2022-10-01: 2 mL via INTRAVENOUS

## 2022-10-01 MED ORDER — IOHEXOL 350 MG/ML SOLN
INTRAVENOUS | Status: DC | PRN
  Administered 2022-10-01: 45 mL

## 2022-10-01 MED ORDER — HEPARIN SODIUM (PORCINE) 1000 UNIT/ML IJ SOLN
INTRAMUSCULAR | Status: AC
  Filled 2022-10-01: qty 10

## 2022-10-01 MED ORDER — VERAPAMIL HCL 2.5 MG/ML IV SOLN
INTRAVENOUS | Status: AC
  Filled 2022-10-01: qty 2

## 2022-10-01 MED ORDER — NITROGLYCERIN 2 % TD OINT: 0.5000 [in_us] | TOPICAL_OINTMENT | Freq: Four times a day (QID) | TRANSDERMAL | Status: DC | PRN

## 2022-10-01 MED ORDER — LIDOCAINE HCL (PF) 1 % IJ SOLN
INTRAMUSCULAR | Status: AC
  Filled 2022-10-01: qty 30

## 2022-10-01 MED ORDER — HYDRALAZINE HCL 20 MG/ML IJ SOLN: 10.0000 mg | INTRAMUSCULAR | Status: AC | PRN

## 2022-10-01 SURGICAL SUPPLY — 8 items
CATH 5FR JL3.5 JR4 ANG PIG MP (CATHETERS) IMPLANT
GLIDESHEATH SLEND SS 6F .021 (SHEATH) IMPLANT
GUIDEWIRE INQWIRE 1.5J.035X260 (WIRE) IMPLANT
INQWIRE 1.5J .035X260CM (WIRE) ×1
KIT HEART LEFT (KITS) ×1 IMPLANT
PACK CARDIAC CATHETERIZATION (CUSTOM PROCEDURE TRAY) ×1 IMPLANT
TRANSDUCER W/STOPCOCK (MISCELLANEOUS) ×1 IMPLANT
TUBING CIL FLEX 10 FLL-RA (TUBING) ×1 IMPLANT
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
## 2022-10-01 NOTE — Progress Notes (Signed)
ANTICOAGULATION CONSULT NOTE - Follow Up Consult  Pharmacy Consult for Heparin Indication: chest pain/ACS  Allergies  Allergen Reactions   Bee Venom Anaphylaxis   Wasp Venom Anaphylaxis   Fesoterodine Fumarate Er Other (See Comments)    Dry mouth   Other Other (See Comments)    Mold - Stuffy head   Pollen Extract Other (See Comments)    Stuffy nose and head    Patient Measurements: Height: 5\' 8"  (172.7 cm) Weight: 82.6 kg (182 lb) IBW/kg (Calculated) : 68.4 Heparin Dosing Weight: 82.6 kg  Vital Signs: Temp: 98.3 F (36.8 C) (06/07 0447) Temp Source: Oral (06/07 0447) BP: 125/75 (06/07 0447) Pulse Rate: 55 (06/07 0733)  Labs: Recent Labs    09/30/22 1910 09/30/22 2121 10/01/22 0057 10/01/22 0405 10/01/22 0633  HGB 15.1  --   --  14.0  --   HCT 45.7  --   --  41.9  --   PLT 204  --   --  153  --   LABPROT  --   --   --  14.8  --   INR  --   --   --  1.1  --   HEPARINUNFRC  --   --   --   --  0.20*  CREATININE 1.13  --   --   --   --   TROPONINIHS 180* 916* 3,293* 6,602*  --     Estimated Creatinine Clearance: 57.4 mL/min (by C-G formula based on SCr of 1.13 mg/dL).  Assessment: AC/Heme: CP. Hep level 0.2, CBC WNL Elevated and steadily rising troponins.  Goal of Therapy:  Heparin level 0.3-0.7 units/ml Monitor platelets by anticoagulation protocol: Yes   Plan:  Increase IV heparin to 1150 units/hr Recheck levels in 6 hrs if not sent to cath first Daily HL and CBC   Jiah Bari S. Merilynn Finland, PharmD, BCPS Clinical Staff Pharmacist [email or web address] Merilynn Finland, Garvey Westcott Stillinger 10/01/2022,8:06 AM

## 2022-10-01 NOTE — Interval H&P Note (Signed)
Cath Lab Visit (complete for each Cath Lab visit)  Clinical Evaluation Leading to the Procedure:   ACS: Yes.    Non-ACS:    Anginal Classification: CCS IV  Anti-ischemic medical therapy: Minimal Therapy (1 class of medications)  Non-Invasive Test Results: No non-invasive testing performed  Prior CABG: No previous CABG      History and Physical Interval Note:  10/01/2022 4:00 PM  Stanley Romero  has presented today for surgery, with the diagnosis of nstemi.  The various methods of treatment have been discussed with the patient and family. After consideration of risks, benefits and other options for treatment, the patient has consented to  Procedure(s): LEFT HEART CATH AND CORONARY ANGIOGRAPHY (N/A) as a surgical intervention.  The patient's history has been reviewed, patient examined, no change in status, stable for surgery.  I have reviewed the patient's chart and labs.  Questions were answered to the patient's satisfaction.     Lance Muss

## 2022-10-01 NOTE — H&P (View-Only) (Signed)
 Cardiology Consultation   Patient ID: Stanley Romero MRN: [id number]; DOB: 04/02/1946  Admit date: 09/30/2022 Date of Consult: 10/01/2022  PCP:  Reade, Robert, MD (Inactive)   [facility] Providers Cardiologist:  New   Patient Profile:   Stanley Romero is a 77 y.o. male with a history of hypertension, hyperlipidemia, hepatitis in the 1970s, and diverticulitis who is being seen 10/01/2022 for the evaluation of NSTEMI at the request of Dr. Varian.  History of Present Illness:   Stanley Romero is a77 year old male with the above history. He has no known personally history of heart disease. However, he does have a strong family history of CAD. His father had a triple bypass in his 60s as well as CHF. His paternal grandfather and paternal uncle both died from Mis in their 50s. He denies any history of tobacco use.  He presented to the [place] ED last night for further evaluation of chest pain. He was in his usual state of health until yesterday afternoon when he developed sudden onset of central chest pressure that radiated to his back in between his shoulder blades while driving.  He had some associated "haziness"/ lightheadedness with this but no shortness of breath, nausea/ vomiting, or diaphoresis. He states the pain in between his shoulder blades felt more like food was stuck and he had had that pain intermittently in the past but it usually resolves after drinking cold water. However, he has never had the chest pressure before. He denies any exertional chest pain. He goes to the gym and does a lot of weight lifting 3 times a week and has never had any chest pain with this. He denies any shortness of breath, orthopnea, PND, edema, palpitations, dizziness, syncope. No recent fevers or illnesses. No GI symptoms. No abnormal bleeding in urine or stools.   Upon arrival to the ED, patient mildly hypertensive but vitals stable. EKG showed normal sinus rhythm with no acute ST/T changes.  High-sensitivity troponin 189 >> 916. BNP normal. Chest x-ray showed emphysematous changes with scarring in the left base but no acute findings. Chest CTA showed no evidence of aortic aneurysm or dissection but did note coronary calcifications and a fracture of the tip of the inferior aspect of the right scapula on the right (known injury after a mechanical fall earlier this year). WBC 9.0, Hgb 15.1, Plts 204. Na 137, K 4.5, Glucose 119, BUN 20, Cr 1.13. She was started on IV Heparin and admitted. High-sensitivity troponin has now trended up to 6,602. Cardiology consulted.  At the time of this evaluation, patient is resting comfortably. He is no longer having any chest pain after Nitro paste and one does of IV Morphine. He states pain lasted about 14-15 hours in total.  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   11-04-2021, vitrectomy and release of VMT with membrane peel    Past Surgical History:  Procedure Laterality Date   APPENDECTOMY  1956   CHOLECYSTECTOMY N/A 06/26/2020   Procedure: LAPAROSCOPIC CHOLECYSTECTOMY;  Surgeon: Wilson, Eric, MD;  Location: [facility];  Service: General;  Laterality: N/A;   COLON SURGERY  2001   COLONOSCOPY  2010   ENDOSCOPIC RETROGRADE CHOLANGIOPANCREATOGRAPHY (ERCP) WITH PROPOFOL N/A 03/06/2021   Procedure: ENDOSCOPIC RETROGRADE CHOLANGIOPANCREATOGRAPHY (ERCP) WITH PROPOFOL;  Surgeon: Karki, Arya, MD;    Location: [facility];  Service: Gastroenterology;  Laterality: N/A;   FRACTURE SURGERY Right 1959   REMOVAL OF STONES  03/06/2021   Procedure: REMOVAL OF STONES;  Surgeon: Karki, Arya, MD;  Location: [facility];  Service: Gastroenterology;;   ROTATOR CUFF REPAIR Left 2008   SPHINCTEROTOMY  03/06/2021   Procedure: SPHINCTEROTOMY;  Surgeon: Karki, Arya, MD;  Location: [facility];  Service: Gastroenterology;;      Home Medications:  Prior to Admission medications   Medication Sig Start Date End Date Taking? Authorizing Provider  albuterol (VENTOLIN HFA) 108 (90 Base) MCG/ACT inhaler Inhale 1-2 puffs into the lungs every 6 (six) hours as needed for wheezing or shortness of breath (during pollen season).   Yes [provider]  aspirin EC 325 MG tablet Take 975 mg by mouth once as needed (for chest pain).   Yes [provider]  aspirin EC 81 MG tablet Take 81 mg by mouth daily. Swallow whole.   Yes [provider]  atorvastatin (LIPITOR) 40 MG tablet Take 40 mg by mouth daily. 08/08/20  Yes [provider]  BENADRYL ALLERGY 25 MG tablet Take 25 mg by mouth every 6 (six) hours as needed for allergies.   Yes [provider]  dorzolamide-timolol (COSOPT) 22.3-6.8 MG/ML ophthalmic solution Place 1 drop into both eyes 2 (two) times daily. 01/18/21  Yes [provider]  EPINEPHrine 0.3 mg/0.3 mL IJ SOAJ injection Inject 0.3 mg into the muscle as needed for anaphylaxis. 07/22/21  Yes Kim, Yoon M, DO  oxybutynin (DITROPAN) 5 MG tablet Take 5 mg by mouth 3 (three) times daily. 01/18/21  Yes [provider]  tamsulosin (FLOMAX) 0.4 MG CAPS capsule Take 0.4 mg by mouth daily. 07/20/21  Yes [provider]  telmisartan (MICARDIS) 80 MG tablet Take 80 mg by mouth daily. 06/01/20  Yes [provider]  zolpidem (AMBIEN) 10 MG tablet Take 10 mg by mouth at bedtime.   Yes [provider]  ZYRTEC ALLERGY 10 MG tablet Take 10 mg by mouth daily as needed for allergies or rhinitis.   Yes [provider]  b complex vitamins capsule Take 1 capsule by mouth daily. Patient not taking: Reported on 09/30/2022    [provider]  Cholecalciferol (VITAMIN D) 50 MCG (2000 UT) tablet Take 2,000 Units by mouth daily. Patient not taking: Reported on 09/30/2022    [provider]  prednisoLONE acetate (PRED FORTE) 1 % ophthalmic  suspension Place 1 drop into the left eye 4 (four) times daily. Patient not taking: Reported on 09/30/2022 10/28/21   Rankin, Gary A, MD  vitamin C (ASCORBIC ACID) 500 MG tablet Take 500 mg by mouth daily. Patient not taking: Reported on 09/30/2022    [provider]  vitamin E 180 MG (400 UNITS) capsule Take 400 Units by mouth daily. Patient not taking: Reported on 09/30/2022    [provider]  zinc gluconate 50 MG tablet Take 50 mg by mouth 2 (two) times daily. Patient not taking: Reported on 09/30/2022    [provider]    Inpatient Medications: Scheduled Meds:  aspirin EC  81 mg Oral Daily   atorvastatin  80 mg Oral Daily   dorzolamide-timolol  1 drop Both Eyes BID   irbesartan  300 mg Oral Daily   metoprolol tartrate  12.5 mg Oral BID   oxybutynin  5 mg Oral TID   tamsulosin  0.4 mg Oral Daily   Continuous Infusions:  sodium chloride 75 mL/hr at   10/01/22 0324   heparin 1,150 Units/hr (10/01/22 0810)   PRN Meds: acetaminophen, diphenhydrAMINE, morphine injection, nitroGLYCERIN, nitroGLYCERIN, ondansetron (ZOFRAN) IV  Allergies:    Allergies  Allergen Reactions   Bee Venom Anaphylaxis   Wasp Venom Anaphylaxis   Fesoterodine Fumarate Er Other (See Comments)    Dry mouth   Other Other (See Comments)    Mold - Stuffy head   Pollen Extract Other (See Comments)    Stuffy nose and head    Social History:   Social History   Socioeconomic History   Marital status: Single    Spouse name: Not on file   Number of children: Not on file   Years of education: Not on file   Highest education level: Not on file  Occupational History   Not on file  Tobacco Use   Smoking status: Never   Smokeless tobacco: Never  Vaping Use   Vaping Use: Never used  Substance and Sexual Activity   Alcohol use: Yes    Alcohol/week: 24.0 standard drinks of alcohol    Types: 14 Glasses of wine, 10 Standard drinks or equivalent per week   Drug use: Never   Sexual activity:  Not on file  Other Topics Concern   Not on file  Social History Narrative   Not on file   Social Determinants of Health   Financial Resource Strain: Not on file  Food Insecurity: No Food Insecurity (09/30/2022)   Hunger Vital Sign    Worried About Running Out of Food in the Last Year: Never true    Ran Out of Food in the Last Year: Never true  Transportation Needs: No Transportation Needs (09/30/2022)   PRAPARE - Transportation    Lack of Transportation (Medical): No    Lack of Transportation (Non-Medical): No  Physical Activity: Not on file  Stress: Not on file  Social Connections: Not on file  Intimate Partner Violence: Not At Risk (09/30/2022)   Humiliation, Afraid, Rape, and Kick questionnaire    Fear of Current or Ex-Partner: No    Emotionally Abused: No    Physically Abused: No    Sexually Abused: No    Family History:   Family History  Problem Relation Age of Onset   Allergic rhinitis Mother    Heart failure Father    Allergic rhinitis Sister    Asthma Neg Hx    Eczema Neg Hx    Urticaria Neg Hx      ROS:  Please see the history of present illness.  Review of Systems  Constitutional:  Negative for fever.  HENT:  Negative for congestion.   Respiratory:  Negative for cough and shortness of breath.   Cardiovascular:  Positive for chest pain. Negative for palpitations, orthopnea, leg swelling and PND.  Gastrointestinal:  Negative for abdominal pain, blood in stool, melena, nausea and vomiting.  Genitourinary:  Negative for hematuria.  Musculoskeletal:  Negative for myalgias.  Neurological:  Negative for loss of consciousness.  Endo/Heme/Allergies:  Does not bruise/bleed easily.  Psychiatric/Behavioral:  Negative for substance abuse.    Physical Exam/Data:   Vitals:   09/30/22 2315 10/01/22 0447 10/01/22 0715 10/01/22 0733  BP: (!) 152/83 125/75 108/62   Pulse: 72 68 (!) 55 (!) 55  Resp: 19  12 15  Temp: 98.9 F (37.2 C) 98.3 F (36.8 C) 98.6 F (37 C)    TempSrc: Oral Oral Oral   SpO2: 97% 96% 95% 99%  Weight:      Height:          Intake/Output Summary (Last 24 hours) at 10/01/2022 0828 Last data filed at 10/01/2022 0449 Gross per 24 hour  Intake 383.24 ml  Output 650 ml  Net -266.76 ml      09/30/2022   11:01 PM 09/30/2022    7:07 PM 08/23/2021   12:55 PM  Last 3 Weights  Weight (lbs) 182 lb 182 lb 178 lb  Weight (kg) 82.555 kg 82.555 kg 80.74 kg     Body mass index is 27.67 kg/m.  General: 77 y.o. male resting comfortably in no acute distress. HEENT: Normocephalic and atraumatic. Sclera clear. EOMs intact. Neck: Supple. No carotid bruits. No JVD. Heart: RRR. Distinct S1 and S2. No murmurs, gallops, or rubs. Radial and distal pedal pulses 2+ and equal bilaterally. Lungs: No increased work of breathing. Clear to ausculation bilaterally. No wheezes, rhonchi, or rales.  Abdomen: Soft, non-distended, and non-tender to palpation. Bowel sounds present in all 4 quadrants.  MSK: Normal strength and tone for age. Extremities: No clubbing, cyanosis, or edema.    Skin: Warm and dry. Neuro: Alert and oriented x3. No focal deficits. Psych: Normal affect. Responds appropriately.  EKG:  The EKG was personally reviewed and demonstrates:  Normal sinus rhythm with no acute ST/T changes.  Telemetry:  Telemetry was personally reviewed and demonstrates:  Sinus rhythm with rates mostly in the 50s (occasionally in the 40s).  Relevant CV Studies: N/A.  Laboratory Data:  High Sensitivity Troponin:   Recent Labs  Lab 09/30/22 1910 09/30/22 2121 10/01/22 0057 10/01/22 0405  TROPONINIHS 180* 916* 3,293* 6,602*     Chemistry Recent Labs  Lab 09/30/22 1910  NA 137  K 4.5  CL 103  CO2 25  GLUCOSE 119*  BUN 20  CREATININE 1.13  CALCIUM 9.2  GFRNONAA >60  ANIONGAP 9    No results for input(s): "PROT", "ALBUMIN", "AST", "ALT", "ALKPHOS", "BILITOT" in the last 168 hours. Lipids  Recent Labs  Lab 10/01/22 0405  CHOL 155  TRIG 117  HDL  47  LDLCALC 85  CHOLHDL 3.3    Hematology Recent Labs  Lab 09/30/22 1910 10/01/22 0405  WBC 9.0 7.1  RBC 4.63 4.21*  HGB 15.1 14.0  HCT 45.7 41.9  MCV 98.7 99.5  MCH 32.6 33.3  MCHC 33.0 33.4  RDW 12.8 12.8  PLT 204 153   Thyroid No results for input(s): "TSH", "FREET4" in the last 168 hours.  BNP Recent Labs  Lab 10/01/22 0057  BNP 76.8    DDimer No results for input(s): "DDIMER" in the last 168 hours.   Radiology/Studies:  CT Angio Chest/Abd/Pel for Dissection W and/or Wo Contrast  Result Date: 09/30/2022 CLINICAL DATA:  Acute aortic syndrome suspected. Central chest pain radiating to back. EXAM: CT ANGIOGRAPHY CHEST, ABDOMEN AND PELVIS TECHNIQUE: Non-contrast CT of the chest was initially obtained. Multidetector CT imaging through the chest, abdomen and pelvis was performed using the standard protocol during bolus administration of intravenous contrast. Multiplanar reconstructed images and MIPs were obtained and reviewed to evaluate the vascular anatomy. RADIATION DOSE REDUCTION: This exam was performed according to the departmental dose-optimization program which includes automated exposure control, adjustment of the mA and/or kV according to patient size and/or use of iterative reconstruction technique. CONTRAST:  100mL OMNIPAQUE IOHEXOL 350 MG/ML SOLN COMPARISON:  08/23/2021. FINDINGS: CTA CHEST FINDINGS Cardiovascular: The heart is normal in size and there is a trace pericardial effusion. Three-vessel coronary artery calcifications are noted. There is calcification of the mitral valve annulus. There is atherosclerotic calcification of the   aorta without evidence of aneurysm or dissection. The pulmonary trunk is normal in caliber. Mediastinum/Nodes: No enlarged mediastinal, hilar, or axillary lymph nodes. Thyroid gland, trachea, and esophagus demonstrate no significant findings. Lungs/Pleura: Mild pleural and parenchymal scarring is noted at the lung apices bilaterally. Minimal  atelectasis is noted bilaterally. No effusion or pneumothorax. Musculoskeletal: Degenerative changes are present in the thoracic spine. There is a mildly displaced fracture of the inferior tip of the scapula on the right, new from the previous exam. Review of the MIP images confirms the above findings. CTA ABDOMEN AND PELVIS FINDINGS VASCULAR Aorta: Normal caliber aorta without aneurysm, dissection, vasculitis or significant stenosis. Aortic atherosclerosis. Celiac: Patent without evidence of aneurysm, dissection, vasculitis or significant stenosis. SMA: Patent without evidence of aneurysm, dissection, vasculitis or significant stenosis. Renals: Both renal arteries are patent without evidence of aneurysm, dissection, vasculitis, fibromuscular dysplasia or significant stenosis. IMA: Patent without evidence of aneurysm, dissection, vasculitis or significant stenosis. Inflow: Patent without evidence of aneurysm, dissection, vasculitis or significant stenosis. Veins: No obvious venous abnormality within the limitations of this arterial phase study. Review of the MIP images confirms the above findings. NON-VASCULAR Hepatobiliary: No focal liver abnormality is seen. Status post cholecystectomy. No biliary dilatation. Pancreas: Unremarkable. No pancreatic ductal dilatation or surrounding inflammatory changes. Spleen: Normal in size without focal abnormality. Adrenals/Urinary Tract: Adrenal glands are within normal limits. The kidneys enhance symmetrically. A cyst is present in the lower pole of the right kidney. No renal calculus or hydronephrosis. The bladder is unremarkable. Stomach/Bowel: Stomach is within normal limits. Appendix is not seen. No evidence of bowel wall thickening, distention, or inflammatory changes. No free air or pneumatosis. Scattered diverticula are present along the colon without evidence of diverticulitis. Lymphatic: No abdominal or pelvic lymphadenopathy. Reproductive: The prostate gland is  enlarged. Other: No abdominopelvic ascites. A fat containing inguinal hernias noted on the left. Musculoskeletal: Degenerative changes are present in the lumbar spine. No acute osseous abnormality. Review of the MIP images confirms the above findings. IMPRESSION: 1. Aortic atherosclerosis with no evidence of aneurysm or dissection. 2. Fracture of the tip of the inferior aspect of the scapula on the right. 3. Diverticulosis without diverticulitis. 4. Enlarged prostate gland. 5. Coronary artery calcifications. Electronically Signed   By: Laura  Taylor M.D.   On: 09/30/2022 21:10   DG Chest 2 View  Result Date: 09/30/2022 CLINICAL DATA:  Chest pain radiating to the back. EXAM: CHEST - 2 VIEW COMPARISON:  11/29/2020 FINDINGS: Heart size and pulmonary vascularity are normal. Linear scarring in the left lung base. Probable emphysematous changes in the lungs. No airspace disease or consolidation. Calcification of the mitral valve annulus. Tortuous and calcified aorta. Mediastinal contours appear intact. Degenerative changes in the spine and shoulders. Old fracture deformity of the right clavicle. Postoperative changes in the right shoulder. IMPRESSION: 1. No evidence of active pulmonary disease. 2. 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 Electronically Signed   By: William  Stevens M.D.   On: 09/30/2022 19:46     Assessment and Plan:   NSTEMI  Patient presented with sudden onset of chest pain yesterday with radiation to shoulder blades.. EKG shows no acute ischemic changes. High-sensitivity troponin 180 >> 916 >> 3,293 >> 6,602 consistent with ACS. Chest CTA negative for dissection but did not coronary calcifications.  - Currently chest pain free. - Echo has been ordered and is pending.  - Continue IV Heparin.  - Continue Aspirin 81mg daily.  - Continue Lopressor 12.5mg   twice daily. He was started on this last night. Looks like rates were in the 40s at time overnight but currently  in the 50s. He is asymptomatic with this. OK to continue for now but may need to stop if he has recurrent rates in the 40s (especially during daytime hours).  - Will increase home Lipitor to 80mg daily. - Patient will need LHC today.   The patient understands that risks include but are not limited to stroke (1 in 1000), death (1 in 1000), kidney failure [usually temporary] (1 in 500), bleeding (1 in 200), allergic reaction [possibly serious] (1 in 200), and agrees to proceed.   Hypertension BP initially mildly elevated but well controlled this morning. - Continue Irbesartan 300mg daily (on Telmisartan at home).  - Continue Lopressor 12.5mg twice daily.   Hyperlipidemia Lipid panel this admission: Total Cholesterol 155, Triglycerides 117, HDL 47, LDL 85. LDL goal <70. Lipoprotein A pending.  - Will home Lipitor from 40mg to 80mg daily.  - He has a history of hepatitis. Alk Phos was mildly elevated on labs last year in 08/2021 but otherwise LFTs were normal. Will check LFTs today. - Will also need repeat lipid panel and LFTs in 6-8 weeks.    Risk Assessment/Risk Scores:   TIMI Risk Score for Unstable Angina or Non-ST Elevation MI:   The patient's TIMI risk score is 4, which indicates a 20% risk of all cause mortality, new or recurrent myocardial infarction or need for urgent revascularization in the next 14 days.{  For questions or updates, please contact [facility] Please consult [email or web address] for contact info under    Signed, Latora Quarry E Tais Koestner, PA-C  10/01/2022 8:28 AM  

## 2022-10-01 NOTE — Progress Notes (Signed)
Mobility Specialist - Progress Note  Pre-mobility: 56 bpm HR, 92% SpO2, 14 RR During mobility: 76 bpm HR, 100% SpO2, 30 RR Post-mobility: 66 bpm HR, 99% SPO2, 13 RR   10/01/22 0942  Mobility  Activity Ambulated independently in hallway  Level of Assistance Standby assist, set-up cues, supervision of patient - no hands on  Assistive Device None  Distance Ambulated (ft) 700 ft  Range of Motion/Exercises Active  Activity Response Tolerated well  Mobility Referral Yes  $Mobility charge 1 Mobility  Mobility Specialist Start Time (ACUTE ONLY) [id number]  Mobility Specialist Stop Time (ACUTE ONLY) 0940  Mobility Specialist Time Calculation (min) (ACUTE ONLY) 17 min   Pt was found in bed and agreeable to ambulate. Took x1 standing rest break at doorway stated feeling "fuzzy". No complaints afterwards. At EOS returned to bed with all necessities in reach and RN notified.  Billey Chang Mobility Specialist

## 2022-10-01 NOTE — Progress Notes (Signed)
  Echocardiogram 2D Echocardiogram has been performed.  Milda Smart 10/01/2022, 2:20 PM

## 2022-10-01 NOTE — Progress Notes (Signed)
Critical Lab Value    Lab called for troponin result of 6,602. Skip Mayer, MD and Anthoney Harada, NP notified.

## 2022-10-01 NOTE — Progress Notes (Signed)
Critical Lab Value  Lab called for troponin result of 3,293. Skip Mayer, MD and Anthoney Harada, NP notified.

## 2022-10-01 NOTE — Consult Note (Signed)
Cardiology Consultation   Patient ID: Stanley Romero MRN: [id number]; DOB: 05/07/1945  Admit date: 09/30/2022 Date of Consult: 10/01/2022  PCP:  Elias Else, MD (Inactive)   [facility] Providers Cardiologist:  New   Patient Profile:   Stanley Romero is a 77 y.o. male with a history of hypertension, hyperlipidemia, hepatitis in the 1970s, and diverticulitis who is being seen 10/01/2022 for the evaluation of NSTEMI at the request of Dr. Maisie Fus.  History of Present Illness:   Stanley Romero is a77 year old male with the above history. He has no known personally history of heart disease. However, he does have a strong family history of CAD. His father had a triple bypass in his 67s as well as CHF. His paternal grandfather and paternal uncle both died from Mis in their 46s. He denies any history of tobacco use.  He presented to the [facility] ED last night for further evaluation of chest pain. He was in his usual state of health until yesterday afternoon when he developed sudden onset of central chest pressure that radiated to his back in between his shoulder blades while driving.  He had some associated "haziness"/ lightheadedness with this but no shortness of breath, nausea/ vomiting, or diaphoresis. He states the pain in between his shoulder blades felt more like food was stuck and he had had that pain intermittently in the past but it usually resolves after drinking cold water. However, he has never had the chest pressure before. He denies any exertional chest pain. He goes to the gym and does a lot of weight lifting 3 times a week and has never had any chest pain with this. He denies any shortness of breath, orthopnea, PND, edema, palpitations, dizziness, syncope. No recent fevers or illnesses. No GI symptoms. No abnormal bleeding in urine or stools.   Upon arrival to the ED, patient mildly hypertensive but vitals stable. EKG showed normal sinus rhythm with no acute ST/T changes.  High-sensitivity troponin 189 >> 916. BNP normal. Chest x-ray showed emphysematous changes with scarring in the left base but no acute findings. Chest CTA showed no evidence of aortic aneurysm or dissection but did note coronary calcifications and a fracture of the tip of the inferior aspect of the right scapula on the right (known injury after a mechanical fall earlier this year). WBC 9.0, Hgb 15.1, Plts 204. Na 137, K 4.5, Glucose 119, BUN 20, Cr 1.13. She was started on IV Heparin and admitted. High-sensitivity troponin has now trended up to 6,602. Cardiology consulted.  At the time of this evaluation, patient is resting comfortably. He is no longer having any chest pain after Nitro paste and one does of IV Morphine. He states pain lasted about 14-15 hours in total.  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   11-04-2021, vitrectomy and release of VMT with membrane peel    Past Surgical History:  Procedure Laterality Date   APPENDECTOMY  1956   CHOLECYSTECTOMY N/A 06/26/2020   Procedure: LAPAROSCOPIC CHOLECYSTECTOMY;  Surgeon: Gaynelle Adu, MD;  Location: [facility];  Service: General;  Laterality: N/A;   COLON SURGERY  2001   COLONOSCOPY  2010   ENDOSCOPIC RETROGRADE CHOLANGIOPANCREATOGRAPHY (ERCP) WITH PROPOFOL N/A 03/06/2021   Procedure: ENDOSCOPIC RETROGRADE CHOLANGIOPANCREATOGRAPHY (ERCP) WITH PROPOFOL;  Surgeon: Kerin Salen, MD;  Location: [facility];  Service: Gastroenterology;  Laterality: N/A;   FRACTURE SURGERY Right 1959   REMOVAL OF STONES  03/06/2021   Procedure: REMOVAL OF STONES;  Surgeon: Kerin Salen, MD;  Location: [facility];  Service: Gastroenterology;;   ROTATOR CUFF REPAIR Left 2008   SPHINCTEROTOMY  03/06/2021   Procedure: Dennison Mascot;  Surgeon: Kerin Salen, MD;  Location: [facility];  Service: Gastroenterology;;      Home Medications:  Prior to Admission medications   Medication Sig Start Date End Date Taking? Authorizing Provider  albuterol (VENTOLIN HFA) 108 (90 Base) MCG/ACT inhaler Inhale 1-2 puffs into the lungs every 6 (six) hours as needed for wheezing or shortness of breath (during pollen season).   Yes [provider]  aspirin EC 325 MG tablet Take 975 mg by mouth once as needed (for chest pain).   Yes [provider]  aspirin EC 81 MG tablet Take 81 mg by mouth daily. Swallow whole.   Yes [provider]  atorvastatin (LIPITOR) 40 MG tablet Take 40 mg by mouth daily. 08/08/20  Yes [provider]  BENADRYL ALLERGY 25 MG tablet Take 25 mg by mouth every 6 (six) hours as needed for allergies.   Yes [provider]  dorzolamide-timolol (COSOPT) 22.3-6.8 MG/ML ophthalmic solution Place 1 drop into both eyes 2 (two) times daily. 01/18/21  Yes [provider]  EPINEPHrine 0.3 mg/0.3 mL IJ SOAJ injection Inject 0.3 mg into the muscle as needed for anaphylaxis. 07/22/21  Yes Ellamae Sia, DO  oxybutynin (DITROPAN) 5 MG tablet Take 5 mg by mouth 3 (three) times daily. 01/18/21  Yes [provider]  tamsulosin (FLOMAX) 0.4 MG CAPS capsule Take 0.4 mg by mouth daily. 07/20/21  Yes [provider]  telmisartan (MICARDIS) 80 MG tablet Take 80 mg by mouth daily. 06/01/20  Yes [provider]  zolpidem (AMBIEN) 10 MG tablet Take 10 mg by mouth at bedtime.   Yes [provider]  ZYRTEC ALLERGY 10 MG tablet Take 10 mg by mouth daily as needed for allergies or rhinitis.   Yes [provider]  b complex vitamins capsule Take 1 capsule by mouth daily. Patient not taking: Reported on 09/30/2022    [provider]  Cholecalciferol (VITAMIN D) 50 MCG (2000 UT) tablet Take 2,000 Units by mouth daily. Patient not taking: Reported on 09/30/2022    [provider]  prednisoLONE acetate (PRED FORTE) 1 % ophthalmic  suspension Place 1 drop into the left eye 4 (four) times daily. Patient not taking: Reported on 09/30/2022 10/28/21   Rankin, Alford Highland, MD  vitamin C (ASCORBIC ACID) 500 MG tablet Take 500 mg by mouth daily. Patient not taking: Reported on 09/30/2022    [provider]  vitamin E 180 MG (400 UNITS) capsule Take 400 Units by mouth daily. Patient not taking: Reported on 09/30/2022    [provider]  zinc gluconate 50 MG tablet Take 50 mg by mouth 2 (two) times daily. Patient not taking: Reported on 09/30/2022    [provider]    Inpatient Medications: Scheduled Meds:  aspirin EC  81 mg Oral Daily   atorvastatin  80 mg Oral Daily   dorzolamide-timolol  1 drop Both Eyes BID   irbesartan  300 mg Oral Daily   metoprolol tartrate  12.5 mg Oral BID   oxybutynin  5 mg Oral TID   tamsulosin  0.4 mg Oral Daily   Continuous Infusions:  sodium chloride 75 mL/hr at  10/01/22 0324   heparin 1,150 Units/hr (10/01/22 0810)   PRN Meds: acetaminophen, diphenhydrAMINE, morphine injection, nitroGLYCERIN, nitroGLYCERIN, ondansetron (ZOFRAN) IV  Allergies:    Allergies  Allergen Reactions   Bee Venom Anaphylaxis   Wasp Venom Anaphylaxis   Fesoterodine Fumarate Er Other (See Comments)    Dry mouth   Other Other (See Comments)    Mold - Stuffy head   Pollen Extract Other (See Comments)    Stuffy nose and head    Social History:   Social History   Socioeconomic History   Marital status: Single    Spouse name: Not on file   Number of children: Not on file   Years of education: Not on file   Highest education level: Not on file  Occupational History   Not on file  Tobacco Use   Smoking status: Never   Smokeless tobacco: Never  Vaping Use   Vaping Use: Never used  Substance and Sexual Activity   Alcohol use: Yes    Alcohol/week: 24.0 standard drinks of alcohol    Types: 14 Glasses of wine, 10 Standard drinks or equivalent per week   Drug use: Never   Sexual activity:  Not on file  Other Topics Concern   Not on file  Social History Narrative   Not on file   Social Determinants of Health   Financial Resource Strain: Not on file  Food Insecurity: No Food Insecurity (09/30/2022)   Hunger Vital Sign    Worried About Running Out of Food in the Last Year: Never true    Ran Out of Food in the Last Year: Never true  Transportation Needs: No Transportation Needs (09/30/2022)   PRAPARE - Administrator, Civil Service (Medical): No    Lack of Transportation (Non-Medical): No  Physical Activity: Not on file  Stress: Not on file  Social Connections: Not on file  Intimate Partner Violence: Not At Risk (09/30/2022)   Humiliation, Afraid, Rape, and Kick questionnaire    Fear of Current or Ex-Partner: No    Emotionally Abused: No    Physically Abused: No    Sexually Abused: No    Family History:   Family History  Problem Relation Age of Onset   Allergic rhinitis Mother    Heart failure Father    Allergic rhinitis Sister    Asthma Neg Hx    Eczema Neg Hx    Urticaria Neg Hx      ROS:  Please see the history of present illness.  Review of Systems  Constitutional:  Negative for fever.  HENT:  Negative for congestion.   Respiratory:  Negative for cough and shortness of breath.   Cardiovascular:  Positive for chest pain. Negative for palpitations, orthopnea, leg swelling and PND.  Gastrointestinal:  Negative for abdominal pain, blood in stool, melena, nausea and vomiting.  Genitourinary:  Negative for hematuria.  Musculoskeletal:  Negative for myalgias.  Neurological:  Negative for loss of consciousness.  Endo/Heme/Allergies:  Does not bruise/bleed easily.  Psychiatric/Behavioral:  Negative for substance abuse.    Physical Exam/Data:   Vitals:   09/30/22 2315 10/01/22 0447 10/01/22 0715 10/01/22 0733  BP: (!) 152/83 125/75 108/62   Pulse: 72 68 (!) 55 (!) 55  Resp: 19  12 15   Temp: 98.9 F (37.2 C) 98.3 F (36.8 C) 98.6 F (37 C)    TempSrc: Oral Oral Oral   SpO2: 97% 96% 95% 99%  Weight:      Height:  Intake/Output Summary (Last 24 hours) at 10/01/2022 0828 Last data filed at 10/01/2022 0449 Gross per 24 hour  Intake 383.24 ml  Output 650 ml  Net -266.76 ml      09/30/2022   11:01 PM 09/30/2022    7:07 PM 08/23/2021   12:55 PM  Last 3 Weights  Weight (lbs) 182 lb 182 lb 178 lb  Weight (kg) 82.555 kg 82.555 kg 80.74 kg     Body mass index is 27.67 kg/m.  General: 77 y.o. male resting comfortably in no acute distress. HEENT: Normocephalic and atraumatic. Sclera clear. EOMs intact. Neck: Supple. No carotid bruits. No JVD. Heart: RRR. Distinct S1 and S2. No murmurs, gallops, or rubs. Radial and distal pedal pulses 2+ and equal bilaterally. Lungs: No increased work of breathing. Clear to ausculation bilaterally. No wheezes, rhonchi, or rales.  Abdomen: Soft, non-distended, and non-tender to palpation. Bowel sounds present in all 4 quadrants.  MSK: Normal strength and tone for age. Extremities: No clubbing, cyanosis, or edema.    Skin: Warm and dry. Neuro: Alert and oriented x3. No focal deficits. Psych: Normal affect. Responds appropriately.  EKG:  The EKG was personally reviewed and demonstrates:  Normal sinus rhythm with no acute ST/T changes.  Telemetry:  Telemetry was personally reviewed and demonstrates:  Sinus rhythm with rates mostly in the 50s (occasionally in the 40s).  Relevant CV Studies: N/A.  Laboratory Data:  High Sensitivity Troponin:   Recent Labs  Lab 09/30/22 1910 09/30/22 2121 10/01/22 0057 10/01/22 0405  TROPONINIHS 180* 916* 3,293* 6,602*     Chemistry Recent Labs  Lab 09/30/22 1910  NA 137  K 4.5  CL 103  CO2 25  GLUCOSE 119*  BUN 20  CREATININE 1.13  CALCIUM 9.2  GFRNONAA >60  ANIONGAP 9    No results for input(s): "PROT", "ALBUMIN", "AST", "ALT", "ALKPHOS", "BILITOT" in the last 168 hours. Lipids  Recent Labs  Lab 10/01/22 0405  CHOL 155  TRIG 117  HDL  47  LDLCALC 85  CHOLHDL 3.3    Hematology Recent Labs  Lab 09/30/22 1910 10/01/22 0405  WBC 9.0 7.1  RBC 4.63 4.21*  HGB 15.1 14.0  HCT 45.7 41.9  MCV 98.7 99.5  MCH 32.6 33.3  MCHC 33.0 33.4  RDW 12.8 12.8  PLT 204 153   Thyroid No results for input(s): "TSH", "FREET4" in the last 168 hours.  BNP Recent Labs  Lab 10/01/22 0057  BNP 76.8    DDimer No results for input(s): "DDIMER" in the last 168 hours.   Radiology/Studies:  CT Angio Chest/Abd/Pel for Dissection W and/or Wo Contrast  Result Date: 09/30/2022 CLINICAL DATA:  Acute aortic syndrome suspected. Central chest pain radiating to back. EXAM: CT ANGIOGRAPHY CHEST, ABDOMEN AND PELVIS TECHNIQUE: Non-contrast CT of the chest was initially obtained. Multidetector CT imaging through the chest, abdomen and pelvis was performed using the standard protocol during bolus administration of intravenous contrast. Multiplanar reconstructed images and MIPs were obtained and reviewed to evaluate the vascular anatomy. RADIATION DOSE REDUCTION: This exam was performed according to the departmental dose-optimization program which includes automated exposure control, adjustment of the mA and/or kV according to patient size and/or use of iterative reconstruction technique. CONTRAST:  OMNIPAQUE IOHEXOL 350 MG/ML SOLN COMPARISON:  08/23/2021. FINDINGS: CTA CHEST FINDINGS Cardiovascular: The heart is normal in size and there is a trace pericardial effusion. Three-vessel coronary artery calcifications are noted. There is calcification of the mitral valve annulus. There is atherosclerotic calcification of the  aorta without evidence of aneurysm or dissection. The pulmonary trunk is normal in caliber. Mediastinum/Nodes: No enlarged mediastinal, hilar, or axillary lymph nodes. Thyroid gland, trachea, and esophagus demonstrate no significant findings. Lungs/Pleura: Mild pleural and parenchymal scarring is noted at the lung apices bilaterally. Minimal  atelectasis is noted bilaterally. No effusion or pneumothorax. Musculoskeletal: Degenerative changes are present in the thoracic spine. There is a mildly displaced fracture of the inferior tip of the scapula on the right, new from the previous exam. Review of the MIP images confirms the above findings. CTA ABDOMEN AND PELVIS FINDINGS VASCULAR Aorta: Normal caliber aorta without aneurysm, dissection, vasculitis or significant stenosis. Aortic atherosclerosis. Celiac: Patent without evidence of aneurysm, dissection, vasculitis or significant stenosis. SMA: Patent without evidence of aneurysm, dissection, vasculitis or significant stenosis. Renals: Both renal arteries are patent without evidence of aneurysm, dissection, vasculitis, fibromuscular dysplasia or significant stenosis. IMA: Patent without evidence of aneurysm, dissection, vasculitis or significant stenosis. Inflow: Patent without evidence of aneurysm, dissection, vasculitis or significant stenosis. Veins: No obvious venous abnormality within the limitations of this arterial phase study. Review of the MIP images confirms the above findings. NON-VASCULAR Hepatobiliary: No focal liver abnormality is seen. Status post cholecystectomy. No biliary dilatation. Pancreas: Unremarkable. No pancreatic ductal dilatation or surrounding inflammatory changes. Spleen: Normal in size without focal abnormality. Adrenals/Urinary Tract: Adrenal glands are within normal limits. The kidneys enhance symmetrically. A cyst is present in the lower pole of the right kidney. No renal calculus or hydronephrosis. The bladder is unremarkable. Stomach/Bowel: Stomach is within normal limits. Appendix is not seen. No evidence of bowel wall thickening, distention, or inflammatory changes. No free air or pneumatosis. Scattered diverticula are present along the colon without evidence of diverticulitis. Lymphatic: No abdominal or pelvic lymphadenopathy. Reproductive: The prostate gland is  enlarged. Other: No abdominopelvic ascites. A fat containing inguinal hernias noted on the left. Musculoskeletal: Degenerative changes are present in the lumbar spine. No acute osseous abnormality. Review of the MIP images confirms the above findings. IMPRESSION: 1. Aortic atherosclerosis with no evidence of aneurysm or dissection. 2. Fracture of the tip of the inferior aspect of the scapula on the right. 3. Diverticulosis without diverticulitis. 4. Enlarged prostate gland. 5. Coronary artery calcifications. Electronically Signed   By: Thornell Sartorius M.D.   On: 09/30/2022 21:10   DG Chest 2 View  Result Date: 09/30/2022 CLINICAL DATA:  Chest pain radiating to the back. EXAM: CHEST - 2 VIEW COMPARISON:  11/29/2020 FINDINGS: Heart size and pulmonary vascularity are normal. Linear scarring in the left lung base. Probable emphysematous changes in the lungs. No airspace disease or consolidation. Calcification of the mitral valve annulus. Tortuous and calcified aorta. Mediastinal contours appear intact. Degenerative changes in the spine and shoulders. Old fracture deformity of the right clavicle. Postoperative changes in the right shoulder. IMPRESSION: 1. No evidence of active pulmonary disease. 2. 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 Electronically Signed   By: Burman Nieves M.D.   On: 09/30/2022 19:46     Assessment and Plan:   NSTEMI  Patient presented with sudden onset of chest pain yesterday with radiation to shoulder blades.. EKG shows no acute ischemic changes. High-sensitivity troponin 180 >> 916 >> 3,293 >> 6,602 consistent with ACS. Chest CTA negative for dissection but did not coronary calcifications.  - Currently chest pain free. - Echo has been ordered and is pending.  - Continue IV Heparin.  - Continue Aspirin 81mg  daily.  - Continue Lopressor 12.5mg   twice daily. He was started on this last night. Looks like rates were in the 40s at time overnight but currently  in the 50s. He is asymptomatic with this. OK to continue for now but may need to stop if he has recurrent rates in the 40s (especially during daytime hours).  - Will increase home Lipitor to 80mg  daily. - Patient will need LHC today.   The patient understands that risks include but are not limited to stroke (1 in 1000), death (1 in 1000), kidney failure [usually temporary] (1 in 500), bleeding (1 in 200), allergic reaction [possibly serious] (1 in 200), and agrees to proceed.   Hypertension BP initially mildly elevated but well controlled this morning. - Continue Irbesartan 300mg  daily (on Telmisartan at home).  - Continue Lopressor 12.5mg  twice daily.   Hyperlipidemia Lipid panel this admission: Total Cholesterol 155, Triglycerides 117, HDL 47, LDL 85. LDL goal <70. Lipoprotein A pending.  - Will home Lipitor from 40mg  to 80mg  daily.  - He has a history of hepatitis. Alk Phos was mildly elevated on labs last year in 08/2021 but otherwise LFTs were normal. Will check LFTs today. - Will also need repeat lipid panel and LFTs in 6-8 weeks.    Risk Assessment/Risk Scores:   TIMI Risk Score for Unstable Angina or Non-ST Elevation MI:   The patient's TIMI risk score is 4, which indicates a 20% risk of all cause mortality, new or recurrent myocardial infarction or need for urgent revascularization in the next 14 days.{  For questions or updates, please contact  [facility] Please consult [email or web address] for contact info under    Signed, Corrin Parker, PA-C  10/01/2022 8:28 AM

## 2022-10-01 NOTE — Progress Notes (Signed)
   Left cardiac catheterization showed only 25% stenosis of mid RCA and otherwise only minimal disease. Echo showed LVEF of 55-60% with normal wall motion and grade 2 diastolic dysfunction. Unclear what caused degree of troponin elevation. Chest CTA was negative for aortic dissection and did not mention any PE. Will check Sed Rate and CRP for possible myocarditis. May end up needing an outpatient cardiac MRI. Will monitor patient overnight but he should be able to be discharged tomorrow. Updated patient on results and plan.  Corrin Parker, PA-C 10/01/2022 5:55 PM

## 2022-10-01 NOTE — Progress Notes (Signed)
Received patient from [facility]  via [facility].  Alert and oriented X4, skin warm and dry , resp even and unlabored, denies chest pain or any discomfort at this time.  Pt placed on bedside monitor, consent signed and IVF infusing with Heparin upon arrival.  Pt waiting for cath procedure with call bell in reach.

## 2022-10-02 ENCOUNTER — Other Ambulatory Visit: Payer: Self-pay | Admitting: Cardiology

## 2022-10-02 ENCOUNTER — Encounter (HOSPITAL_COMMUNITY): Payer: Self-pay | Admitting: Internal Medicine

## 2022-10-02 DIAGNOSIS — R079 Chest pain, unspecified: Secondary | ICD-10-CM

## 2022-10-02 DIAGNOSIS — I2510 Atherosclerotic heart disease of native coronary artery without angina pectoris: Secondary | ICD-10-CM

## 2022-10-02 DIAGNOSIS — I214 Non-ST elevation (NSTEMI) myocardial infarction: Secondary | ICD-10-CM | POA: Diagnosis not present

## 2022-10-02 DIAGNOSIS — I319 Disease of pericardium, unspecified: Secondary | ICD-10-CM

## 2022-10-02 DIAGNOSIS — R7989 Other specified abnormal findings of blood chemistry: Secondary | ICD-10-CM

## 2022-10-02 HISTORY — DX: Other specified abnormal findings of blood chemistry: R79.89

## 2022-10-02 HISTORY — DX: Disease of pericardium, unspecified: I31.9

## 2022-10-02 HISTORY — DX: Atherosclerotic heart disease of native coronary artery without angina pectoris: I25.10

## 2022-10-02 LAB — BASIC METABOLIC PANEL
Anion gap: 8 (ref 5–15)
BUN: 12 mg/dL (ref 8–23)
CO2: 27 mmol/L (ref 22–32)
Calcium: 8.5 mg/dL — ABNORMAL LOW (ref 8.9–10.3)
Chloride: 103 mmol/L (ref 98–111)
Creatinine, Ser: 1.1 mg/dL (ref 0.61–1.24)
GFR, Estimated: >60 mL/min (ref >60)
Glucose, Bld: 97 mg/dL (ref 70–99)
Potassium: 3.8 mmol/L (ref 3.5–5.1)
Sodium: 138 mmol/L (ref 135–145)

## 2022-10-02 LAB — CBC
HCT: 41.9 % (ref 39.0–52.0)
Hemoglobin: 13.8 g/dL (ref 13.0–17.0)
MCH: 32.2 pg (ref 26.0–34.0)
MCHC: 32.9 g/dL (ref 30.0–36.0)
MCV: 97.9 fL (ref 80.0–100.0)
Platelets: 168 10*3/uL (ref 150–400)
RBC: 4.28 MIL/uL (ref 4.22–5.81)
RDW: 13 % (ref 11.5–15.5)
WBC: 5.5 10*3/uL (ref 4.0–10.5)
nRBC: 0 % (ref 0.0–0.2)

## 2022-10-02 LAB — TSH: TSH: 5.122 u[IU]/mL — ABNORMAL HIGH (ref 0.350–4.500)

## 2022-10-02 LAB — T4, FREE: Free T4: 0.65 ng/dL (ref 0.61–1.12)

## 2022-10-02 MED ORDER — ACETAMINOPHEN 325 MG PO TABS: 650.0000 mg | ORAL_TABLET | ORAL | Status: AC | PRN

## 2022-10-02 MED ORDER — NITROGLYCERIN 0.4 MG SL SUBL: 0.4000 mg | SUBLINGUAL_TABLET | SUBLINGUAL | 4 refills | Status: AC | PRN

## 2022-10-02 MED ORDER — ASPIRIN 81 MG PO TBEC: 81.0000 mg | DELAYED_RELEASE_TABLET | Freq: Every day | ORAL | 12 refills | Status: DC

## 2022-10-02 NOTE — Discharge Instructions (Addendum)
The office will call you concerning your cardia MRI  if you have not heard by Tuesday please call the office.    Heart Healthy Diet  Call the office for any Questions.   Please note follow up appointment at the [facility] office.  Address is on papers    No exercise for 3 months.   Call [facility] at [phone] if any bleeding, swelling or drainage at cath site.  May shower, no tub baths for 48 hours for groin sticks. No lifting over 5 pounds for 3 days.  No Driving for 3 days

## 2022-10-02 NOTE — Discharge Summary (Addendum)
Discharge Summary    Patient ID: Stanley Romero MRN: [id number]; DOB: 1945/09/14  Admit date: 09/30/2022 Discharge date: 10/02/2022  PCP:  Elias Else, MD (Inactive)   [facility] Providers Cardiologist:  None        Discharge Diagnoses    Principal Problem:   Pericarditis Active Problems:   Hypertension   CAD in native artery, cardiac cath 10/01/22 non obstructive CAD   Elevated troponin level not due to acute coronary syndrome    Diagnostic Studies/Procedures    Cardiac Catheterization 10/01/22  Mid RCA lesion is 25% stenosed.   The left ventricular systolic function is normal.   LV end diastolic pressure is mildly elevated.   The left ventricular ejection fraction is 55-65% by visual estimate.   There is no aortic valve stenosis.   In the absence of any other complications or medical issues, we expect the patient to be ready for discharge from a cath perspective on 10/01/2022.   Recommend Aspirin 81mg  daily for moderate CAD.   Mild, nonobstructive coronary atherosclerosis.  Unclear etiology of elevated troponin.   _______Diagnostic Dominance: Right  ECHO 10/01/22 IMPRESSIONS     1. Left ventricular ejection fraction, by estimation, is 55 to 60%. The  left ventricle has normal function. The left ventricle has no regional  wall motion abnormalities. Left ventricular diastolic parameters are  consistent with Grade II diastolic  dysfunction (pseudonormalization).   2. Right ventricular systolic function is normal. The right ventricular  size is normal.   3. Left atrial size was mildly dilated.   4. The mitral valve is abnormal. Moderate mitral valve regurgitation. No  evidence of mitral stenosis. Moderate mitral annular calcification.   5. The aortic valve is tricuspid. There is mild calcification of the  aortic valve. Aortic valve regurgitation is trivial. No aortic stenosis is  present.   6. Aortic dilatation noted. There is borderline dilatation of the  aortic  root, measuring 38 mm.   7. The inferior vena cava is normal in size with greater than 50%  respiratory variability, suggesting right atrial pressure of 3 mmHg.   FINDINGS   Left Ventricle: Left ventricular ejection fraction, by estimation, is 55  to 60%. The left ventricle has normal function. The left ventricle has no  regional wall motion abnormalities. Definity contrast agent was given IV  to delineate the left ventricular   endocardial borders. The left ventricular internal cavity size was normal  in size. There is no left ventricular hypertrophy. Left ventricular  diastolic parameters are consistent with Grade II diastolic dysfunction  (pseudonormalization).   Right Ventricle: The right ventricular size is normal. No increase in  right ventricular wall thickness. Right ventricular systolic function is  normal.   Left Atrium: Left atrial size was mildly dilated.   Right Atrium: Right atrial size was normal in size.   Pericardium: There is no evidence of pericardial effusion.   Mitral Valve: The mitral valve is abnormal. Moderate mitral annular  calcification. Moderate mitral valve regurgitation. No evidence of mitral  valve stenosis. MV peak gradient, 6.1 mmHg. The mean mitral valve gradient  is 2.0 mmHg.   Tricuspid Valve: The tricuspid valve is normal in structure. Tricuspid  valve regurgitation is mild . No evidence of tricuspid stenosis.   Aortic Valve: The aortic valve is tricuspid. There is mild calcification  of the aortic valve. Aortic valve regurgitation is trivial. No aortic  stenosis is present.   Pulmonic Valve: The pulmonic valve was not well visualized.  Pulmonic valve  regurgitation is not visualized. No evidence of pulmonic stenosis.   Aorta: Aortic dilatation noted. There is borderline dilatation of the  aortic root, measuring 38 mm.   Venous: The inferior vena cava is normal in size with greater than 50%  respiratory variability, suggesting  right atrial pressure of 3 mmHg.   IAS/Shunts: No atrial level shunt detected by color flow Doppler.   ______   History of Present Illness     Stanley Romero is a 77 y.o. 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  Stanley Romero presented to ER 09/30/22 with chest pain that developed suddenly with radiation to back in between his shoulder blades while driving.  He has associated haziness/lightheadedness but no SOB, N/V or diaphoresis.  More of pain like food stuck -he noted he has had in past and after drinking cold water then if resolves.  This time it did go to chest.  In ER EKG with SR no acute ST/T wave changes and troponin 189>>916.    Chest x-ray showed emphysematous changes with scarring in the left base but no acute findings. Chest CTA showed no evidence of aortic aneurysm or dissection but did note coronary calcifications and a fracture of the tip of the inferior aspect of the right scapula on the right (known injury after a mechanical fall earlier this year). WBC 9.0, Hgb 15.1, Plts 204. Na 137, K 4.5, Glucose 119, BUN 20, Cr 1.13. placed on IV Heparin and admitted.    Pain resolved with NTG paste and dose of IV Morphine.  He had pain for 14-15 hours.   Hospital Course     Consultants: cardiology Dr. Carmon Ginsberg.   Hs trop peaked at 6,602.  Sed rate at 5,  CRP 0.7  TSH 5.122 free T4 pending. Along with T3.   Pt underwent cardiac cath 10/01/22 and good news of mild CAD mRCA 25%.  EF was normal with cath and echo and has G2DD.  Possible pericarditis.    Unsure cause of elevated troponin.  Will check cardiac MRI as outpt.  He will be called at home to arrange on Monday.  Order placed.  Pt has been seen and evaluated by Dr. Flora Lipps and found stable for discharge.           Did the patient have an acute coronary syndrome (MI, NSTEMI, STEMI, etc) this admission?:  No                                Did the patient have a percutaneous coronary intervention (stent / angioplasty)?:  No.          _____________  Discharge Vitals Blood pressure 119/70, pulse 60, temperature 98 F (36.7 C), temperature source Oral, resp. rate 13, height 5\' 8"  (1.727 m), weight 85 kg, SpO2 96 %.  Filed Weights   09/30/22 1907 09/30/22 2301 10/02/22 0633  Weight: 82.6 kg 82.6 kg 85 kg   Exam per Dr. Flora Lipps. General:Pleasant affect, NAD Skin:Warm and dry, brisk capillary refill HEENT:normocephalic, sclera clear, mucus membranes moist Neck:supple, no JVD, no bruits  Heart:S1S2 RRR without murmur, gallup, rub or click  cath site without hematoma Lungs:clear without rales, rhonchi, or wheezes ZOX:WRUE, non tender, + BS, do not palpate liver spleen or masses Ext:no lower ext edema, 2+ pedal pulses, 2+ radial  pulses Neuro:alert and oriented, MAE, follows commands, + facial symmetry    Labs & Radiologic Studies    CBC Recent Labs    10/01/22 0405 10/02/22 0242  WBC 7.1 5.5  HGB 14.0 13.8  HCT 41.9 41.9  MCV 99.5 97.9  PLT 153 168   Basic Metabolic Panel Recent Labs    16/10/96 0633 10/02/22 0242  NA 139 138  K 4.3 3.8  CL 106 103  CO2 26 27  GLUCOSE 104* 97  BUN 15 12  CREATININE 1.01 1.10  CALCIUM 8.9 8.5*   Liver Function Tests Recent Labs    10/01/22 0633  AST 40  ALT 28  ALKPHOS 177*  BILITOT 0.8  PROT 6.3*  ALBUMIN 3.3*   No results for input(s): "LIPASE", "AMYLASE" in the last 72 hours. High Sensitivity Troponin:   Recent Labs  Lab 09/30/22 1910 09/30/22 2121 10/01/22 0057 10/01/22 0405  TROPONINIHS 180* 916* 3,293* 6,602*    BNP Invalid input(s): "POCBNP" D-Dimer No results for input(s): "DDIMER" in the last 72 hours. Hemoglobin A1C Recent Labs    10/01/22 0057  HGBA1C 5.3   Fasting Lipid Panel Recent Labs    10/01/22 0405  CHOL 155  HDL 47  LDLCALC 85  TRIG 117  CHOLHDL 3.3   Thyroid Function Tests Recent Labs    10/02/22 0242  TSH  5.122*   _____________  CARDIAC CATHETERIZATION  Result Date: 10/01/2022   Mid RCA lesion is 25% stenosed.   The left ventricular systolic function is normal.   LV end diastolic pressure is mildly elevated.   The left ventricular ejection fraction is 55-65% by visual estimate.   There is no aortic valve stenosis.   In the absence of any other complications or medical issues, we expect the patient to be ready for discharge from a cath perspective on 10/01/2022.   Recommend Aspirin 81mg  daily for moderate CAD. Mild, nonobstructive coronary atherosclerosis.  Unclear etiology of elevated troponin. Results conveyed to his friend, Silva Bandy.   ECHOCARDIOGRAM COMPLETE  Result Date: 10/01/2022    ECHOCARDIOGRAM REPORT   Patient Name:   NORMAL GRESS Date of Exam: 10/01/2022 Medical Rec #:  [id number]        Height:       68.0 in Accession #:    [id number]       Weight:       182.0 lb Date of Birth:  11-16-1945        BSA:          1.963 m Patient Age:    89 years         BP:           117/79 mmHg Patient Gender: M                HR:           52 bpm. Exam Location:  Inpatient Procedure: 2D Echo, Cardiac Doppler, Color Doppler and Intracardiac            Opacification Agent Indications:    NSTEMI  History:        Patient has no prior history of Echocardiogram examinations.                 Signs/Symptoms:Chest Pain; Risk Factors:Hypertension, Family                 History of Coronary Artery Disease and Dyslipidemia.  Sonographer:    Milda Smart Referring Phys: [id number] SARA-MAIZ A Maisie Fus  Sonographer Comments: Image acquisition challenging due to patient body habitus and Image acquisition challenging due to respiratory motion. IMPRESSIONS  1. Left ventricular ejection fraction, by estimation, is 55 to 60%. The left ventricle has normal function. The left ventricle has no regional wall motion abnormalities. Left ventricular diastolic parameters are consistent with Grade II diastolic dysfunction (pseudonormalization).  2.  Right ventricular systolic function is normal. The right ventricular size is normal.  3. Left atrial size was mildly dilated.  4. The mitral valve is abnormal. Moderate mitral valve regurgitation. No evidence of mitral stenosis. Moderate mitral annular calcification.  5. The aortic valve is tricuspid. There is mild calcification of the aortic valve. Aortic valve regurgitation is trivial. No aortic stenosis is present.  6. Aortic dilatation noted. There is borderline dilatation of the aortic root, measuring 38 mm.  7. The inferior vena cava is normal in size with greater than 50% respiratory variability, suggesting right atrial pressure of 3 mmHg. FINDINGS  Left Ventricle: Left ventricular ejection fraction, by estimation, is 55 to 60%. The left ventricle has normal function. The left ventricle has no regional wall motion abnormalities. Definity contrast agent was given IV to delineate the left ventricular  endocardial borders. The left ventricular internal cavity size was normal in size. There is no left ventricular hypertrophy. Left ventricular diastolic parameters are consistent with Grade II diastolic dysfunction (pseudonormalization). Right Ventricle: The right ventricular size is normal. No increase in right ventricular wall thickness. Right ventricular systolic function is normal. Left Atrium: Left atrial size was mildly dilated. Right Atrium: Right atrial size was normal in size. Pericardium: There is no evidence of pericardial effusion. Mitral Valve: The mitral valve is abnormal. Moderate mitral annular calcification. Moderate mitral valve regurgitation. No evidence of mitral valve stenosis. MV peak gradient, 6.1 mmHg. The mean mitral valve gradient is 2.0 mmHg. Tricuspid Valve: The tricuspid valve is normal in structure. Tricuspid valve regurgitation is mild . No evidence of tricuspid stenosis. Aortic Valve: The aortic valve is tricuspid. There is mild calcification of the aortic valve. Aortic valve  regurgitation is trivial. No aortic stenosis is present. Pulmonic Valve: The pulmonic valve was not well visualized. Pulmonic valve regurgitation is not visualized. No evidence of pulmonic stenosis. Aorta: Aortic dilatation noted. There is borderline dilatation of the aortic root, measuring 38 mm. Venous: The inferior vena cava is normal in size with greater than 50% respiratory variability, suggesting right atrial pressure of 3 mmHg. IAS/Shunts: No atrial level shunt detected by color flow Doppler.  LEFT VENTRICLE PLAX 2D LVIDd:         4.00 cm     Diastology LVIDs:         3.10 cm     LV e' medial:    5.22 cm/s LV PW:         1.00 cm     LV E/e' medial:  20.7 LV IVS:        0.60 cm     LV e' lateral:   5.44 cm/s LVOT diam:     2.00 cm     LV E/e' lateral: 19.9 LV SV:         87 LV SV Index:   44 LVOT Area:     3.14 cm  LV Volumes (MOD) LV vol d, MOD A2C: 70.8 ml LV vol d, MOD A4C: 81.4 ml LV vol s, MOD A2C: 29.4 ml LV vol s, MOD A4C: 29.4 ml LV SV MOD A2C:     41.4 ml LV  SV MOD A4C:     81.4 ml LV SV MOD BP:      44.9 ml RIGHT VENTRICLE             IVC RV Basal diam:  3.90 cm     IVC diam: 1.30 cm RV S prime:     13.40 cm/s TAPSE (M-mode): 1.9 cm LEFT ATRIUM             Index        RIGHT ATRIUM           Index LA diam:        3.60 cm 1.83 cm/m   RA Area:     12.40 cm LA Vol (A2C):   65.3 ml 33.26 ml/m  RA Volume:   23.90 ml  12.17 ml/m LA Vol (A4C):   55.3 ml 28.16 ml/m LA Biplane Vol: 65.5 ml 33.36 ml/m  AORTIC VALVE LVOT Vmax:   123.00 cm/s LVOT Vmean:  86.000 cm/s LVOT VTI:    0.278 m  AORTA Ao Root diam: 3.80 cm Ao Asc diam:  3.40 cm MITRAL VALVE MV Area (PHT): 3.63 cm     SHUNTS MV Area VTI:   1.60 cm     Systemic VTI:  0.28 m MV Peak grad:  6.1 mmHg     Systemic Diam: 2.00 cm MV Mean grad:  2.0 mmHg MV Vmax:       1.23 m/s MV Vmean:      66.3 cm/s MV Decel Time: 209 msec Stanley Peak grad: 107.7 mmHg Stanley Mean grad: 67.0 mmHg Stanley Vmax:      519.00 cm/s Stanley Vmean:     390.0 cm/s MV E velocity: 108.00 cm/s MV  A velocity: 74.40 cm/s MV E/A ratio:  1.45 Arvilla Meres MD Electronically signed by Arvilla Meres MD Signature Date/Time: 10/01/2022/3:57:27 PM    Final    CT Angio Chest/Abd/Pel for Dissection W and/or Wo Contrast  Result Date: 09/30/2022 CLINICAL DATA:  Acute aortic syndrome suspected. Central chest pain radiating to back. EXAM: CT ANGIOGRAPHY CHEST, ABDOMEN AND PELVIS TECHNIQUE: Non-contrast CT of the chest was initially obtained. Multidetector CT imaging through the chest, abdomen and pelvis was performed using the standard protocol during bolus administration of intravenous contrast. Multiplanar reconstructed images and MIPs were obtained and reviewed to evaluate the vascular anatomy. RADIATION DOSE REDUCTION: This exam was performed according to the departmental dose-optimization program which includes automated exposure control, adjustment of the mA and/or kV according to patient size and/or use of iterative reconstruction technique. CONTRAST:  OMNIPAQUE IOHEXOL 350 MG/ML SOLN COMPARISON:  08/23/2021. FINDINGS: CTA CHEST FINDINGS Cardiovascular: The heart is normal in size and there is a trace pericardial effusion. Three-vessel coronary artery calcifications are noted. There is calcification of the mitral valve annulus. There is atherosclerotic calcification of the aorta without evidence of aneurysm or dissection. The pulmonary trunk is normal in caliber. Mediastinum/Nodes: No enlarged mediastinal, hilar, or axillary lymph nodes. Thyroid gland, trachea, and esophagus demonstrate no significant findings. Lungs/Pleura: Mild pleural and parenchymal scarring is noted at the lung apices bilaterally. Minimal atelectasis is noted bilaterally. No effusion or pneumothorax. Musculoskeletal: Degenerative changes are present in the thoracic spine. There is a mildly displaced fracture of the inferior tip of the scapula on the right, new from the previous exam. Review of the MIP images confirms the above  findings. CTA ABDOMEN AND PELVIS FINDINGS VASCULAR Aorta: Normal caliber aorta without aneurysm, dissection, vasculitis or significant stenosis. Aortic atherosclerosis. Celiac: Patent without  evidence of aneurysm, dissection, vasculitis or significant stenosis. SMA: Patent without evidence of aneurysm, dissection, vasculitis or significant stenosis. Renals: Both renal arteries are patent without evidence of aneurysm, dissection, vasculitis, fibromuscular dysplasia or significant stenosis. IMA: Patent without evidence of aneurysm, dissection, vasculitis or significant stenosis. Inflow: Patent without evidence of aneurysm, dissection, vasculitis or significant stenosis. Veins: No obvious venous abnormality within the limitations of this arterial phase study. Review of the MIP images confirms the above findings. NON-VASCULAR Hepatobiliary: No focal liver abnormality is seen. Status post cholecystectomy. No biliary dilatation. Pancreas: Unremarkable. No pancreatic ductal dilatation or surrounding inflammatory changes. Spleen: Normal in size without focal abnormality. Adrenals/Urinary Tract: Adrenal glands are within normal limits. The kidneys enhance symmetrically. A cyst is present in the lower pole of the right kidney. No renal calculus or hydronephrosis. The bladder is unremarkable. Stomach/Bowel: Stomach is within normal limits. Appendix is not seen. No evidence of bowel wall thickening, distention, or inflammatory changes. No free air or pneumatosis. Scattered diverticula are present along the colon without evidence of diverticulitis. Lymphatic: No abdominal or pelvic lymphadenopathy. Reproductive: The prostate gland is enlarged. Other: No abdominopelvic ascites. A fat containing inguinal hernias noted on the left. Musculoskeletal: Degenerative changes are present in the lumbar spine. No acute osseous abnormality. Review of the MIP images confirms the above findings. IMPRESSION: 1. Aortic atherosclerosis with no  evidence of aneurysm or dissection. 2. Fracture of the tip of the inferior aspect of the scapula on the right. 3. Diverticulosis without diverticulitis. 4. Enlarged prostate gland. 5. Coronary artery calcifications. Electronically Signed   By: Thornell Sartorius M.D.   On: 09/30/2022 21:10   DG Chest 2 View  Result Date: 09/30/2022 CLINICAL DATA:  Chest pain radiating to the back. EXAM: CHEST - 2 VIEW COMPARISON:  11/29/2020 FINDINGS: Heart size and pulmonary vascularity are normal. Linear scarring in the left lung base. Probable emphysematous changes in the lungs. No airspace disease or consolidation. Calcification of the mitral valve annulus. Tortuous and calcified aorta. Mediastinal contours appear intact. Degenerative changes in the spine and shoulders. Old fracture deformity of the right clavicle. Postoperative changes in the right shoulder. IMPRESSION: 1. No evidence of active pulmonary disease. 2. 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 Electronically Signed   By: Burman Nieves M.D.   On: 09/30/2022 19:46   Disposition   Pt is being discharged home today in good condition.  Follow-up Plans & Appointments   The office will call you concerning your cardia MRI  if you have not heard by Tuesday please call the office.    Heart Healthy Diet  Call the office for any Questions.   Please note follow up appointment at the [facility] office.  Address is on papers    Call [facility] at [phone] if any bleeding, swelling or drainage at cath site.  May shower, no tub baths for 48 hours for groin sticks. No lifting over 5 pounds for 3 days.  No Driving for 3 days  No exercise for 3 months.   Follow-up Information     Corrin Parker, PA-C Follow up.   Specialty: Cardiology Why: Hospital follow-up with Cardiology scheduled for 10/14/2022 at 2:45pm. Please arrive 15 minutes early for check-in. If this date/ time does not work for you, please call  our office to reschedule. Contact information: [address] [place] [address] [phone]  Discharge Instructions     AMB referral to Phase II Cardiac Rehabilitation   Complete by: As directed    Elevated troponin   Diagnosis: Other   After initial evaluation and assessments completed: Virtual Based Care may be provided alone or in conjunction with Phase 2 Cardiac Rehab based on patient barriers.: Yes   Intensive Cardiac Rehabilitation (ICR) [facility] location only OR Traditional Cardiac Rehabilitation (TCR) *If criteria for ICR are not met will enroll in TCR [facility] only): Yes        Discharge Medications   Allergies as of 10/02/2022       Reactions   Bee Venom Anaphylaxis   Wasp Venom Anaphylaxis   Fesoterodine Fumarate Er Other (See Comments)   Dry mouth   Other Other (See Comments)   Mold - Stuffy head   Pollen Extract Other (See Comments)   Stuffy nose and head        Medication List     TAKE these medications    acetaminophen 325 MG tablet Commonly known as: TYLENOL Take 2 tablets (650 mg total) by mouth every 4 (four) hours as needed for headache or mild pain.   albuterol 108 (90 Base) MCG/ACT inhaler Commonly known as: VENTOLIN HFA Inhale 1-2 puffs into the lungs every 6 (six) hours as needed for wheezing or shortness of breath (during pollen season).   ascorbic acid 500 MG tablet Commonly known as: VITAMIN C Take 500 mg by mouth daily.   aspirin EC 81 MG tablet Take 1 tablet (81 mg total) by mouth daily. Swallow whole. Start taking on: October 03, 2022 What changed: Another medication with the same name was removed. Continue taking this medication, and follow the directions you see here.   atorvastatin 40 MG tablet Commonly known as: LIPITOR Take 40 mg by mouth daily.   b complex vitamins capsule Take 1 capsule by mouth daily.   Benadryl Allergy 25 MG tablet Generic drug: diphenhydrAMINE Take 25 mg by mouth every 6  (six) hours as needed for allergies.   dorzolamide-timolol 2-0.5 % ophthalmic solution Commonly known as: COSOPT Place 1 drop into both eyes 2 (two) times daily.   EPINEPHrine 0.3 mg/0.3 mL Soaj injection Commonly known as: EPI-PEN Inject 0.3 mg into the muscle as needed for anaphylaxis.   nitroGLYCERIN 0.4 MG SL tablet Commonly known as: NITROSTAT Place 1 tablet (0.4 mg total) under the tongue every 5 (five) minutes x 3 doses as needed for chest pain.   oxybutynin 5 MG tablet Commonly known as: DITROPAN Take 5 mg by mouth 3 (three) times daily.   prednisoLONE acetate 1 % ophthalmic suspension Commonly known as: PRED FORTE Place 1 drop into the left eye 4 (four) times daily.   tamsulosin 0.4 MG Caps capsule Commonly known as: FLOMAX Take 0.4 mg by mouth daily.   telmisartan 80 MG tablet Commonly known as: MICARDIS Take 80 mg by mouth daily.   Vitamin D 50 MCG (2000 UT) tablet Take 2,000 Units by mouth daily.   vitamin E 180 MG (400 UNITS) capsule Take 400 Units by mouth daily.   zinc gluconate 50 MG tablet Take 50 mg by mouth 2 (two) times daily.   zolpidem 10 MG tablet Commonly known as: AMBIEN Take 10 mg by mouth at bedtime.   ZyrTEC Allergy 10 MG tablet Generic drug: cetirizine Take 10 mg by mouth daily as needed for allergies or rhinitis.           Outstanding Labs/Studies   Consider BMP  Duration of Discharge Encounter   Greater than 30 minutes including physician time.  Signed, Nada Boozer, NP 10/02/2022, 10:58 AM

## 2022-10-04 ENCOUNTER — Encounter (HOSPITAL_COMMUNITY): Payer: Self-pay | Admitting: Interventional Cardiology

## 2022-10-04 LAB — LIPOPROTEIN A (LPA): Lipoprotein (a): 176.8 nmol/L — ABNORMAL HIGH (ref <75.0)

## 2022-10-05 LAB — T3: T3, Total: 84 ng/dL (ref 71–180)

## 2022-10-08 ENCOUNTER — Encounter (HOSPITAL_COMMUNITY): Payer: Self-pay

## 2022-10-10 NOTE — Progress Notes (Signed)
Cardiology Office Note:    Date:  10/14/2022   ID:  Stanley Romero 05-Feb-1946, MRN [id number]  PCP:  Tally Joe, MD  Cardiologist:  Reatha Harps, MD  Electrophysiologist:  None   Referring MD: No ref. provider found   Chief Complaint: hospital follow-up of NSTEMI  History of Present Illness:    Stanley Romero is a 77 y.o. 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 Flora Lipps and presents today for hospital follow-up of NSTEMI.  Patient was first seen by Cardiology during recent hospitalization. He was admitted from 09/30/2022 to 10/02/2022 for a NSTEMI after presenting with sudden onset of chest pressure that radiated to his back with associated lightheadedness. EKG showed no acute ischemic changes. High-sensitivity troponin peaked at 6,602. Echo showed LVEF of 55-60% with no regional wall motion abnormalities, grade 2 diastolic dysfunction, and moderate mitral regurgitation. LHC showed only mild disease non-obstructive CAD with only 25% stenosis of mid RCA. It was unclear what caused his degree of troponin elevation. Chest CTA on admission was negative for aortic dissection and made not mention of PE. CRP and Sed Rate were normal. It was suspected that he may have myocarditis. Therefore, outpatient cardiac MRI was ordered and he was advised to restrict his activity for the next 3 months.   Patient presents today for follow-up. Here alone. He is doing well since discharge. He denies any recurrent chest pain. No shortness of breath, orthopnea, PND, edema, palpitations, lightheadedness, dizziness, or syncope. Reviewed activity restrictions given presumed diagnosis of myocarditis.   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   11-04-2021, vitrectomy and release of VMT with membrane peel    Past Surgical History:  Procedure Laterality Date   APPENDECTOMY  1956   CHOLECYSTECTOMY N/A 06/26/2020   Procedure: LAPAROSCOPIC CHOLECYSTECTOMY;  Surgeon: Gaynelle Adu, MD;  Location: [facility];  Service: General;  Laterality: N/A;   COLON SURGERY  2001   COLONOSCOPY  2010   ENDOSCOPIC RETROGRADE CHOLANGIOPANCREATOGRAPHY (ERCP) WITH PROPOFOL N/A 03/06/2021   Procedure: ENDOSCOPIC RETROGRADE CHOLANGIOPANCREATOGRAPHY (ERCP) WITH PROPOFOL;  Surgeon: Kerin Salen, MD;  Location: [facility];  Service: Gastroenterology;  Laterality: N/A;   FRACTURE SURGERY Right 1959   LEFT HEART CATH AND CORONARY ANGIOGRAPHY N/A 10/01/2022   Procedure: LEFT HEART CATH AND CORONARY ANGIOGRAPHY;  Surgeon: Corky Crafts, MD;  Location: [facility] INVASIVE CV LAB;  Service: Cardiovascular;  Laterality: N/A;   REMOVAL OF STONES  03/06/2021   Procedure: REMOVAL OF STONES;  Surgeon: Kerin Salen, MD;  Location: [facility];  Service: Gastroenterology;;   ROTATOR CUFF REPAIR Left 2008   SPHINCTEROTOMY  03/06/2021   Procedure: Dennison Mascot;  Surgeon: Kerin Salen, MD;  Location: [facility];  Service: Gastroenterology;;    Current Medications: Current Meds  Medication Sig   acetaminophen (TYLENOL) 325 MG tablet Take 2 tablets (650 mg total) by mouth every 4 (four) hours as needed for headache or mild pain.   albuterol (VENTOLIN HFA) 108 (90 Base) MCG/ACT  inhaler Inhale 1-2 puffs into the lungs every 6 (six) hours as needed for wheezing or shortness of breath (during pollen season).   aspirin EC 81 MG tablet Take 1 tablet (81 mg total) by mouth daily. Swallow whole.   atorvastatin (LIPITOR) 80  MG tablet Take 1 tablet (80 mg total) by mouth daily.   b complex vitamins capsule Take 1 capsule by mouth daily.   BENADRYL ALLERGY 25 MG tablet Take 25 mg by mouth every 6 (six) hours as needed for allergies.   Cholecalciferol (VITAMIN D) 50 MCG (2000 UT) tablet Take 2,000 Units by mouth daily.   dorzolamide-timolol (COSOPT) 22.3-6.8 MG/ML ophthalmic solution Place 1 drop into both eyes 2 (two) times daily.   EPINEPHrine 0.3 mg/0.3 mL IJ SOAJ injection Inject 0.3 mg into the muscle as needed for anaphylaxis.   nitroGLYCERIN (NITROSTAT) 0.4 MG SL tablet Place 1 tablet (0.4 mg total) under the tongue every 5 (five) minutes x 3 doses as needed for chest pain.   oxybutynin (DITROPAN) 5 MG tablet Take 5 mg by mouth 3 (three) times daily.   prednisoLONE acetate (PRED FORTE) 1 % ophthalmic suspension Place 1 drop into the left eye 4 (four) times daily.   tamsulosin (FLOMAX) 0.4 MG CAPS capsule Take 0.4 mg by mouth daily.   telmisartan (MICARDIS) 80 MG tablet Take 80 mg by mouth daily.   vitamin C (ASCORBIC ACID) 500 MG tablet Take 500 mg by mouth daily.   vitamin E 180 MG (400 UNITS) capsule Take 400 Units by mouth daily.   zinc gluconate 50 MG tablet Take 50 mg by mouth 2 (two) times daily.   zolpidem (AMBIEN) 10 MG tablet Take 10 mg by mouth at bedtime.   ZYRTEC ALLERGY 10 MG tablet Take 10 mg by mouth daily as needed for allergies or rhinitis.   [DISCONTINUED] atorvastatin (LIPITOR) 40 MG tablet Take 40 mg by mouth daily.     Allergies:   Bee venom, Wasp venom, Fesoterodine fumarate er, Other, and Pollen extract   Social History   Socioeconomic History   Marital status: Single    Spouse name: Not on file   Number of children: Not on file   Years of education: Not on file   Highest education level: Not on file  Occupational History   Not on file  Tobacco Use   Smoking status: Never   Smokeless tobacco: Never  Vaping Use   Vaping Use: Never used  Substance and Sexual Activity    Alcohol use: Yes    Alcohol/week: 24.0 standard drinks of alcohol    Types: 14 Glasses of wine, 10 Standard drinks or equivalent per week   Drug use: Never   Sexual activity: Not on file  Other Topics Concern   Not on file  Social History Narrative   Not on file   Social Determinants of Health   Financial Resource Strain: Not on file  Food Insecurity: No Food Insecurity (09/30/2022)   Hunger Vital Sign    Worried About Running Out of Food in the Last Year: Never true    Ran Out of Food in the Last Year: Never true  Transportation Needs: No Transportation Needs (09/30/2022)   PRAPARE - Administrator, Civil Service (Medical): No    Lack of Transportation (Non-Medical): No  Physical Activity: Not on file  Stress: Not on file  Social Connections: Not on file     Family History: The patient's family history includes Allergic rhinitis in his  mother and sister; Heart failure in his father. There is no history of Asthma, Eczema, or Urticaria.  ROS:   Please see the history of present illness.     EKGs/Labs/Other Studies Reviewed:    The following studies were reviewed:  Echocardiogram 10/01/2022: Impressions: 1. Left ventricular ejection fraction, by estimation, is 55 to 60%. The  left ventricle has normal function. The left ventricle has no regional  wall motion abnormalities. Left ventricular diastolic parameters are  consistent with Grade II diastolic  dysfunction (pseudonormalization).   2. Right ventricular systolic function is normal. The right ventricular  size is normal.   3. Left atrial size was mildly dilated.   4. The mitral valve is abnormal. Moderate mitral valve regurgitation. No  evidence of mitral stenosis. Moderate mitral annular calcification.   5. The aortic valve is tricuspid. There is mild calcification of the  aortic valve. Aortic valve regurgitation is trivial. No aortic stenosis is  present.   6. Aortic dilatation noted. There is borderline  dilatation of the aortic  root, measuring 38 mm.   7. The inferior vena cava is normal in size with greater than 50%  respiratory variability, suggesting right atrial pressure of 3 mmHg.  _______________  Left Cardiac Catheterization 10/01/2022:   Mid RCA lesion is 25% stenosed.   The left ventricular systolic function is normal.   LV end diastolic pressure is mildly elevated.   The left ventricular ejection fraction is 55-65% by visual estimate.   There is no aortic valve stenosis.   In the absence of any other complications or medical issues, we expect the patient to be ready for discharge from a cath perspective on 10/01/2022.   Recommend Aspirin 81mg  daily for moderate CAD.   Mild, nonobstructive coronary atherosclerosis.  Unclear etiology of elevated troponin.   Diagnostic Dominance: Right      EKG:  EKG ordered today.  EKG Interpretation  Date/Time:  Thursday October 14 2022 14:55:41 EDT Ventricular Rate:  67 PR Interval:  164 QRS Duration: 90 QT Interval:  402 QTC Calculation: 424 R Axis:   10 Text Interpretation: Normal sinus rhythm No acute ST/ T wave changes. When compared with ECG of 01-Oct-2022 08:52, No significant change was found Confirmed by Marjie Skiff [phone]) on 10/14/2022 3:05:44 PM    Recent Labs: 10/01/2022: ALT 28; B Natriuretic Peptide 76.8 10/02/2022: BUN 12; Creatinine, Ser 1.10; Hemoglobin 13.8; Platelets 168; Potassium 3.8; Sodium 138; TSH 5.122  Recent Lipid Panel    Component Value Date/Time   CHOL 155 10/01/2022 0405   TRIG 117 10/01/2022 0405   HDL 47 10/01/2022 0405   CHOLHDL 3.3 10/01/2022 0405   VLDL 23 10/01/2022 0405   LDLCALC 85 10/01/2022 0405    Physical Exam:    Vital Signs: BP (!) 100/52   Pulse 67   Ht 5\' 8"  (1.727 m)   Wt 183 lb 9.6 oz (83.3 kg)   SpO2 95%   BMI 27.92 kg/m     Wt Readings from Last 3 Encounters:  10/14/22 183 lb 9.6 oz (83.3 kg)  10/02/22 187 lb 6.3 oz (85 kg)  08/23/21 178 lb (80.7 kg)      General: 77 y.o. Caucasian male in no acute distress. HEENT: Normocephalic and atraumatic. Sclera clear.  Neck: Supple. No carotid bruits. No JVD. Heart: RRR. Distinct S1 and S2. No murmurs, gallops, or rubs. Radial pulses 2+ and equal bilaterally. Lungs: No increased work of breathing. Clear to ausculation bilaterally. No wheezes, rhonchi, or rales.  Abdomen: Soft, non-distended, and non-tender to palpation.  Extremities: No lower extremity edema.  Right radial cath site soft with no signs of hematoma.  Skin: Warm and dry. Neuro: Alert and oriented x3. No focal deficits. Psych: Normal affect. Responds appropriately.  Assessment:    1. Suspected myocarditis   2. Recent NSTEMI   3. Non-obstructive CAD   4. Primary hypertension   5. Hyperlipidemia, unspecified hyperlipidemia type     Plan:    Recent NSTEMI Suspect Myocarditis Non-Obstructive CAD Patient was recently admitted for a NSTEMI after presenting with chest pain. High-sensitivity troponin peaked in the 6,000s. However, LHC showed on mild non-obstructive disease. Echo showed normal LV function and no regional wall motion abnormalities. CTA was negative for dissection or PE. Sed Rate and CRP were normal. Unclear what caused troponin elevation but suspected to be myocarditis. Outpatient cardiac MRI was ordered.  - No recurrent chest pain.  - Continue aspirin and statin. - Reviewed activity restrictions for 3 months given suspicion for myocarditis. He can walk but no jogging, running, biking, or weight lifting until after cardiac MRI. Cardiac MRI is scheduled for 12/07/2022.  Hypertension BP soft but stable. - Continue Telmisartan 80mg  daily.    Hyperlipidemia Lipid panel on 10/01/2023 during recent admission: Total Cholesterol 155, Triglycerides 117, HDL 47, LDL 85. LDL goal <70. Lipoprotein A 176.  - Will increase Lipitor to 80mg  daily to try to get LDL at goal.  - Will repeat lipid panel in 6 weeks. Of note, he has a  history of hepatitis. Alk Phos appears chronically mildly elevated.   Disposition: Keep follow-up visit with Dr. Flora Lipps in 11/2022 after cardiac MRI.   Medication Adjustments/Labs and Tests Ordered: Current medicines are reviewed at length with the patient today.  Concerns regarding medicines are outlined above.  Orders Placed This Encounter  Procedures   Hepatic function panel   Lipid panel   EKG 12-Lead   Meds ordered this encounter  Medications   atorvastatin (LIPITOR) 80 MG tablet    Sig: Take 1 tablet (80 mg total) by mouth daily.    Dispense:  90 tablet    Refill:  3    Discontinue  40 mg dose    Patient Instructions  Medication Instructions:   Increase torsemide 80 mg   *If you need a refill on your cardiac medications before your next appointment, please call your pharmacy*   Lab Work:  Liver and lipid - in 6 weeks   fasting   If you have labs (blood work) drawn today and your tests are completely normal, you will receive your results only by: MyChart Message (if you have MyChart) OR A paper copy in the mail If you have any lab test that is abnormal or we need to change your treatment, we will call you to review the results.   Testing/Procedures:  Not needed  Keep appointment to have  MRI  Follow-Up: At [facility], you and your health needs are our priority.  As part of our continuing mission to provide you with exceptional heart care, we have created designated Provider Care Teams.  These Care Teams include your primary Cardiologist (physician) and Advanced Practice Providers (APPs -  Physician Assistants and Nurse Practitioners) who all work together to provide you with the care you need, when you need it.     Your next appointment:   Keep appointment with Dr Flora Lipps   The format for your next appointment:   In Person  Provider:   Gerri Spore  Cleophus Molt, MD    Other Instructions    Signed, Corrin Parker, PA-C  10/14/2022 3:48 PM      [facility]

## 2022-10-11 ENCOUNTER — Ambulatory Visit (INDEPENDENT_AMBULATORY_CARE_PROVIDER_SITE_OTHER): Payer: Medicare Other | Admitting: *Deleted

## 2022-10-11 DIAGNOSIS — Z91038 Other insect allergy status: Secondary | ICD-10-CM

## 2022-10-14 ENCOUNTER — Encounter: Payer: Self-pay | Admitting: Student

## 2022-10-14 ENCOUNTER — Ambulatory Visit: Payer: Medicare Other | Attending: Student | Admitting: Student

## 2022-10-14 VITALS — BP 100/52 | HR 67 | Ht 68.0 in | Wt 183.6 lb

## 2022-10-14 DIAGNOSIS — I2510 Atherosclerotic heart disease of native coronary artery without angina pectoris: Secondary | ICD-10-CM | POA: Diagnosis not present

## 2022-10-14 DIAGNOSIS — E785 Hyperlipidemia, unspecified: Secondary | ICD-10-CM | POA: Diagnosis not present

## 2022-10-14 DIAGNOSIS — I514 Myocarditis, unspecified: Secondary | ICD-10-CM | POA: Diagnosis not present

## 2022-10-14 DIAGNOSIS — I10 Essential (primary) hypertension: Secondary | ICD-10-CM | POA: Diagnosis not present

## 2022-10-14 DIAGNOSIS — I252 Old myocardial infarction: Secondary | ICD-10-CM | POA: Diagnosis not present

## 2022-10-14 MED ORDER — ATORVASTATIN CALCIUM 80 MG PO TABS: 80.0000 mg | ORAL_TABLET | Freq: Every day | ORAL | 3 refills | Status: DC

## 2022-10-14 NOTE — Patient Instructions (Addendum)
Medication Instructions:   Increase torsemide 80 mg   *If you need a refill on your cardiac medications before your next appointment, please call your pharmacy*   Lab Work:  Liver and lipid - in 6 weeks   fasting   If you have labs (blood work) drawn today and your tests are completely normal, you will receive your results only by: MyChart Message (if you have MyChart) OR A paper copy in the mail If you have any lab test that is abnormal or we need to change your treatment, we will call you to review the results.   Testing/Procedures:  Not needed  Keep appointment to have  MRI  Follow-Up: At [facility], you and your health needs are our priority.  As part of our continuing mission to provide you with exceptional heart care, we have created designated Provider Care Teams.  These Care Teams include your primary Cardiologist (physician) and Advanced Practice Providers (APPs -  Physician Assistants and Nurse Practitioners) who all work together to provide you with the care you need, when you need it.     Your next appointment:   Keep appointment with Dr Flora Lipps   The format for your next appointment:   In Person  Provider:   Reatha Harps, MD    Other Instructions

## 2022-10-18 NOTE — Telephone Encounter (Signed)
Hi Marcelino Duster, can you help me pass this along to our cardiac MRI schedulers/ coordinators? Can we get patient on a wait list for a cardiac MRI in case anything opens up sooner than his current scheduled appointment in August?  Thank you!

## 2022-10-29 NOTE — Telephone Encounter (Signed)
Hi Teresa. He doesn't have a CT scheduled. Only the cardiac MRI.  Thanks! Rhoda Waldvogel

## 2022-10-29 NOTE — Telephone Encounter (Signed)
I don't see an order, I will enter CT order to be scheduled. What is the diagnosis?

## 2022-10-29 NOTE — Telephone Encounter (Signed)
He does not need a CT. He only needs a cardiac MRI which was ordered to evaluate for myocarditis. He was just wanting to see if he could be put on a waiting list for this in case something opened up before his scheduled time in August.   Thank you! Scherrie Seneca

## 2022-11-03 ENCOUNTER — Telehealth (HOSPITAL_COMMUNITY): Payer: Self-pay

## 2022-11-03 NOTE — Telephone Encounter (Signed)
Pt is not interested in the cardiac rehab program. Closed referral 

## 2022-11-08 ENCOUNTER — Ambulatory Visit: Payer: Medicare Other

## 2022-11-11 ENCOUNTER — Ambulatory Visit (INDEPENDENT_AMBULATORY_CARE_PROVIDER_SITE_OTHER): Payer: Medicare Other | Admitting: *Deleted

## 2022-11-11 DIAGNOSIS — Z91038 Other insect allergy status: Secondary | ICD-10-CM | POA: Diagnosis not present

## 2022-11-18 DIAGNOSIS — I2510 Atherosclerotic heart disease of native coronary artery without angina pectoris: Secondary | ICD-10-CM | POA: Diagnosis not present

## 2022-11-18 DIAGNOSIS — E785 Hyperlipidemia, unspecified: Secondary | ICD-10-CM | POA: Diagnosis not present

## 2022-12-03 ENCOUNTER — Telehealth: Payer: Self-pay

## 2022-12-03 ENCOUNTER — Encounter (HOSPITAL_COMMUNITY): Payer: Self-pay

## 2022-12-03 DIAGNOSIS — E785 Hyperlipidemia, unspecified: Secondary | ICD-10-CM

## 2022-12-03 NOTE — Telephone Encounter (Signed)
Pt returned call. Pt was notified of lab results and is ok wit seeing pharm-d for lipids. Referral placed. Please arrange appointment.

## 2022-12-03 NOTE — Telephone Encounter (Signed)
Patient returned staff call. 

## 2022-12-03 NOTE — Telephone Encounter (Signed)
Lmom to discuss lab results and recommendations.  

## 2022-12-03 NOTE — Addendum Note (Signed)
Addended by: Lamar Benes on: 12/03/2022 11:28 AM   Modules accepted: Orders

## 2022-12-07 ENCOUNTER — Other Ambulatory Visit: Payer: Self-pay | Admitting: Cardiology

## 2022-12-07 ENCOUNTER — Ambulatory Visit (HOSPITAL_COMMUNITY)
Admission: RE | Admit: 2022-12-07 | Discharge: 2022-12-07 | Disposition: A | Discharge status: Home / Self Care | Payer: Medicare Other | Source: Ambulatory Visit | Attending: Cardiology | Admitting: Cardiology

## 2022-12-07 DIAGNOSIS — R079 Chest pain, unspecified: Secondary | ICD-10-CM

## 2022-12-07 MED ORDER — GADOBUTROL 1 MMOL/ML IV SOLN
10.0000 mL | Freq: Once | INTRAVENOUS | Status: AC | PRN
  Administered 2022-12-07: 10 mL via INTRAVENOUS

## 2022-12-09 ENCOUNTER — Ambulatory Visit (INDEPENDENT_AMBULATORY_CARE_PROVIDER_SITE_OTHER): Payer: Medicare Other | Admitting: *Deleted

## 2022-12-09 DIAGNOSIS — Z91038 Other insect allergy status: Secondary | ICD-10-CM | POA: Diagnosis not present

## 2022-12-12 NOTE — Progress Notes (Unsigned)
Cardiology Office Note:   Date:  12/13/2022  NAME:  Stanley Romero    MRN: [id number] DOB:  [birth year]   PCP:  Tally Joe, MD  Cardiologist:  Reatha Harps, MD  Electrophysiologist:  None   Referring MD: Tally Joe, MD   Chief Complaint  Patient presents with   Follow-up    History of Present Illness:   Stanley Romero is a 77 y.o. male with a hx of myocarditis who presents for follow-up.  He reports no chest pains or trouble breathing.  No palpitations.  He is still restricted on activity.  We discussed he needs a heart monitor and can be released activity on 01/01/2023.  We discussed the self-limited nature of myocarditis.  He has nonobstructive CAD.  LDL is less than 100.  I am okay with this.  Blood pressure well-controlled.  Denies any symptoms.  He is ready to get back to activity.  Overall doing well.  We also discussed the results of his echocardiogram which show moderate mitral valve regurgitation.  This will need to be repeated in 1 year.  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 HLD -T chol 159, HDL 52, LDL 87, TG 114 4. HTN 5. 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   11-04-2021, vitrectomy and release of VMT with membrane peel    Past Surgical History: Past Surgical History:  Procedure Laterality Date   APPENDECTOMY  1956   CHOLECYSTECTOMY N/A 06/26/2020   Procedure: LAPAROSCOPIC CHOLECYSTECTOMY;  Surgeon: Gaynelle Adu, MD;  Location: [facility];  Service: General;  Laterality: N/A;   COLON SURGERY  2001    COLONOSCOPY  2010   ENDOSCOPIC RETROGRADE CHOLANGIOPANCREATOGRAPHY (ERCP) WITH PROPOFOL N/A 03/06/2021   Procedure: ENDOSCOPIC RETROGRADE CHOLANGIOPANCREATOGRAPHY (ERCP) WITH PROPOFOL;  Surgeon: Kerin Salen, MD;  Location: [facility];  Service: Gastroenterology;  Laterality: N/A;   FRACTURE SURGERY Right 1959   LEFT HEART CATH AND CORONARY ANGIOGRAPHY N/A 10/01/2022   Procedure: LEFT HEART CATH AND CORONARY ANGIOGRAPHY;  Surgeon: Corky Crafts, MD;  Location: [facility] INVASIVE CV LAB;  Service: Cardiovascular;  Laterality: N/A;   REMOVAL OF STONES  03/06/2021   Procedure: REMOVAL OF STONES;  Surgeon: Kerin Salen, MD;  Location: [facility];  Service: Gastroenterology;;   ROTATOR CUFF REPAIR Left 2008   SPHINCTEROTOMY  03/06/2021   Procedure: Dennison Mascot;  Surgeon: Kerin Salen, MD;  Location: [facility];  Service: Gastroenterology;;    Current Medications: Current Meds  Medication Sig   acetaminophen (TYLENOL) 325 MG tablet Take 2 tablets (650 mg total) by mouth every 4 (four) hours as needed for headache or mild pain.   albuterol (VENTOLIN HFA) 108 (90 Base) MCG/ACT inhaler Inhale 1-2 puffs into the lungs every 6 (six) hours as needed for wheezing or shortness of breath (during pollen season).   aspirin EC 81 MG tablet Take 1 tablet (81 mg total) by mouth daily. Swallow whole.   atorvastatin (LIPITOR) 80 MG tablet Take 1 tablet (80 mg total) by mouth daily.   b complex vitamins capsule Take  1 capsule by mouth daily.   BENADRYL ALLERGY 25 MG tablet Take 25 mg by mouth every 6 (six) hours as needed for allergies.   Cholecalciferol (VITAMIN D) 50 MCG (2000 UT) tablet Take 2,000 Units by mouth daily.   dorzolamide-timolol (COSOPT) 22.3-6.8 MG/ML ophthalmic solution Place 1 drop into both eyes 2 (two) times daily.   EPINEPHrine 0.3 mg/0.3 mL IJ SOAJ injection Inject 0.3 mg into the muscle as needed for anaphylaxis.   nitroGLYCERIN (NITROSTAT) 0.4 MG SL tablet Place 1 tablet (0.4 mg total)  under the tongue every 5 (five) minutes x 3 doses as needed for chest pain.   oxybutynin (DITROPAN) 5 MG tablet Take 5 mg by mouth 3 (three) times daily.   prednisoLONE acetate (PRED FORTE) 1 % ophthalmic suspension Place 1 drop into the left eye 4 (four) times daily.   tamsulosin (FLOMAX) 0.4 MG CAPS capsule Take 0.4 mg by mouth daily.   telmisartan (MICARDIS) 80 MG tablet Take 80 mg by mouth daily.   vitamin C (ASCORBIC ACID) 500 MG tablet Take 500 mg by mouth daily.   vitamin E 180 MG (400 UNITS) capsule Take 400 Units by mouth daily.   zinc gluconate 50 MG tablet Take 50 mg by mouth 2 (two) times daily.   zolpidem (AMBIEN) 10 MG tablet Take 10 mg by mouth at bedtime.   ZYRTEC ALLERGY 10 MG tablet Take 10 mg by mouth daily as needed for allergies or rhinitis.     Allergies:    Bee venom, Wasp venom, Fesoterodine fumarate er, Other, and Pollen extract   Social History: Social History   Socioeconomic History   Marital status: Single    Spouse name: Not on file   Number of children: Not on file   Years of education: Not on file   Highest education level: Not on file  Occupational History   Not on file  Tobacco Use   Smoking status: Never   Smokeless tobacco: Never  Vaping Use   Vaping status: Never Used  Substance and Sexual Activity   Alcohol use: Yes    Alcohol/week: 24.0 standard drinks of alcohol    Types: 14 Glasses of wine, 10 Standard drinks or equivalent per week   Drug use: Never   Sexual activity: Not on file  Other Topics Concern   Not on file  Social History Narrative   Not on file   Social Determinants of Health   Financial Resource Strain: Not on file  Food Insecurity: No Food Insecurity (09/30/2022)   Hunger Vital Sign    Worried About Running Out of Food in the Last Year: Never true    Ran Out of Food in the Last Year: Never true  Transportation Needs: No Transportation Needs (09/30/2022)   PRAPARE - Administrator, Civil Service (Medical): No     Lack of Transportation (Non-Medical): No  Physical Activity: Not on file  Stress: Not on file  Social Connections: Not on file     Family History: The patient's family history includes Allergic rhinitis in his mother and sister; Heart failure in his father. There is no history of Asthma, Eczema, or Urticaria.  ROS:   All other ROS reviewed and negative. Pertinent positives noted in the HPI.     EKGs/Labs/Other Studies Reviewed:   The following studies were personally reviewed by me today:  EKG:  EKG is not ordered today.        TTE 10/01/2022  1. Left ventricular ejection fraction,  by estimation, is 55 to 60%. The  left ventricle has normal function. The left ventricle has no regional  wall motion abnormalities. Left ventricular diastolic parameters are  consistent with Grade II diastolic  dysfunction (pseudonormalization).   2. Right ventricular systolic function is normal. The right ventricular  size is normal.   3. Left atrial size was mildly dilated.   4. The mitral valve is abnormal. Moderate mitral valve regurgitation. No  evidence of mitral stenosis. Moderate mitral annular calcification.   5. The aortic valve is tricuspid. There is mild calcification of the  aortic valve. Aortic valve regurgitation is trivial. No aortic stenosis is  present.   6. Aortic dilatation noted. There is borderline dilatation of the aortic  root, measuring 38 mm.   7. The inferior vena cava is normal in size with greater than 50%  respiratory variability, suggesting right atrial pressure of 3 mmHg.   Recent Labs: 10/01/2022: B Natriuretic Peptide 76.8 10/02/2022: BUN 12; Creatinine, Ser 1.10; Hemoglobin 13.8; Platelets 168; Potassium 3.8; Sodium 138; TSH 5.122 11/18/2022: ALT 19   Recent Lipid Panel    Component Value Date/Time   CHOL 159 11/18/2022 1004   TRIG 114 11/18/2022 1004   HDL 52 11/18/2022 1004   CHOLHDL 3.1 11/18/2022 1004   CHOLHDL 3.3 10/01/2022 0405   VLDL 23 10/01/2022  0405   LDLCALC 87 11/18/2022 1004    Physical Exam:   VS:  BP 112/60 (BP Location: Right Arm, Patient Position: Sitting, Cuff Size: Normal)   Pulse 72   Ht 5\' 8"  (1.727 m)   Wt 187 lb 12.8 oz (85.2 kg)   SpO2 96%   BMI 28.55 kg/m    Wt Readings from Last 3 Encounters:  12/13/22 187 lb 12.8 oz (85.2 kg)  10/14/22 183 lb 9.6 oz (83.3 kg)  10/02/22 187 lb 6.3 oz (85 kg)    General: Well nourished, well developed, in no acute distress Head: Atraumatic, normal size  Eyes: PEERLA, EOMI  Neck: Supple, no JVD Endocrine: No thryomegaly Cardiac: Normal S1, S2; RRR; no murmurs, rubs, or gallops Lungs: Clear to auscultation bilaterally, no wheezing, rhonchi or rales  Abd: Soft, nontender, no hepatomegaly  Ext: No edema, pulses 2+ Musculoskeletal: No deformities, BUE and BLE strength normal and equal Skin: Warm and dry, no rashes   Neuro: Alert and oriented to person, place, time, and situation, CNII-XII grossly intact, no focal deficits  Psych: Normal mood and affect   ASSESSMENT:   Stanley Romero is a 77 y.o. male who presents for the following: 1. Acute idiopathic myocarditis   2. Non-obstructive CAD   3. Mixed hyperlipidemia   4. Renovascular hypertension   5. Palpitations     PLAN:   1. Acute idiopathic myocarditis -He will be restricted on activity until 01/01/2023.  Needs a 3-day ZIO.  CMR does confirm myocarditis.  EF normal.  No symptoms.  Likely self-limited.  Inflammatory markers negative.  We did discuss that this is a self-limited course and will resolve without any issues.  We will plan to restrict him for another few weeks until he has been restricted for 3 months.  If monitor is normal can resume activity no restrictions.  2. Non-obstructive CAD 3. Mixed hyperlipidemia -25% RCA lesion.  LDL 87.  I am okay with this.  He has made a 77 years with minimal CAD.  I think an LDL less than 100 is okay.  Also on aspirin.  4. Renovascular  hypertension -Well-controlled.  Disposition: Return  in about 6 months (around 06/15/2023).  Medication Adjustments/Labs and Tests Ordered: Current medicines are reviewed at length with the patient today.  Concerns regarding medicines are outlined above.  Orders Placed This Encounter  Procedures   LONG TERM MONITOR (3-14 DAYS)   No orders of the defined types were placed in this encounter.  Patient Instructions  Medication Instructions:  Your physician recommends that you continue on your current medications as directed. Please refer to the Current Medication list given to you today.  *If you need a refill on your cardiac medications before your next appointment, please call your pharmacy*     Follow-Up: At [facility], you and your health needs are our priority.  As part of our continuing mission to provide you with exceptional heart care, we have created designated Provider Care Teams.  These Care Teams include your primary Cardiologist (physician) and Advanced Practice Providers (APPs -  Physician Assistants and Nurse Practitioners) who all work together to provide you with the care you need, when you need it.  We recommend signing up for the patient portal called "MyChart".  Sign up information is provided on this After Visit Summary.  MyChart is used to connect with patients for Virtual Visits (Telemedicine).  Patients are able to view lab/test results, encounter notes, upcoming appointments, etc.  Non-urgent messages can be sent to your provider as well.   To learn more about what you can do with MyChart, go to [email or web address].    Your next appointment:   6 month(s)- call in November to schedule next appointment   Provider:   Reatha Harps, MD Other Instructions ZIO XT- Long Term Monitor Instructions  Your physician has requested you wear a ZIO patch monitor for 3 days.  This is a single patch monitor. [employer] supplies one patch monitor per enrollment.  Additional stickers are not available. Please do not apply patch if you will be having a Nuclear Stress Test,  Echocardiogram, Cardiac CT, MRI, or Chest Xray during the period you would be wearing the  monitor. The patch cannot be worn during these tests. You cannot remove and re-apply the  ZIO XT patch monitor.  Your ZIO patch monitor will be mailed 3 day USPS to your address on file. It may take 3-5 days  to receive your monitor after you have been enrolled.  Once you have received your monitor, please review the enclosed instructions. Your monitor  has already been registered assigning a specific monitor serial # to you.  Billing and Patient Assistance Program Information  We have supplied [employer] with any of your insurance information on file for billing purposes. [employer] offers a sliding scale Patient Assistance Program for patients that do not have  insurance, or whose insurance does not completely cover the cost of the ZIO monitor.  You must apply for the Patient Assistance Program to qualify for this discounted rate.  To apply, please call [employer] at [phone], select option 4, select option 2, ask to apply for  Patient Assistance Program. Meredeth Ide will ask your household income, and how many people  are in your household. They will quote your out-of-pocket cost based on that information.  [employer] will also be able to set up a 17-month, interest-free payment plan if needed.  Applying the monitor   Shave hair from upper left chest.  Hold abrader disc by orange tab. Rub abrader in 40 strokes over the upper left chest as  indicated in your monitor instructions.  Clean area with 4  enclosed alcohol pads. Let dry.  Apply patch as indicated in monitor instructions. Patch will be placed under collarbone on left  side of chest with arrow pointing upward.  Rub patch adhesive wings for 2 minutes. Remove white label marked "1". Remove the white  label marked "2". Rub patch adhesive wings  for 2 additional minutes.  While looking in a mirror, press and release button in center of patch. A small green light will  flash 3-4 times. This will be your only indicator that the monitor has been turned on.  Do not shower for the first 24 hours. You may shower after the first 24 hours.  Press the button if you feel a symptom. You will hear a small click. Record Date, Time and  Symptom in the Patient Logbook.  When you are ready to remove the patch, follow instructions on the last 2 pages of Patient  Logbook. Stick patch monitor onto the last page of Patient Logbook.  Place Patient Logbook in the blue and white box. Use locking tab on box and tape box closed  securely. The blue and white box has prepaid postage on it. Please place it in the mailbox as  soon as possible. Your physician should have your test results approximately 7 days after the  monitor has been mailed back to [facility].  Call [employer] Customer Care at [phone] if you have questions regarding  your ZIO XT patch monitor. Call them immediately if you see an orange light blinking on your  monitor.  If your monitor falls off in less than 4 days, contact our Monitor department at [phone].  If your monitor becomes loose or falls off after 4 days call [employer] at [phone] for  suggestions on securing your monitor    Time Spent with Patient: I have spent a total of 35 minutes with patient reviewing hospital notes, telemetry, EKGs, labs and examining the patient as well as establishing an assessment and plan that was discussed with the patient.  > 50% of time was spent in direct patient care.  Signed, Lenna Gilford. Flora Lipps, MD, [facility]  [facility]  [address], [address] [place], [place] [address] [phone]  12/13/2022 9:56 AM

## 2022-12-13 ENCOUNTER — Ambulatory Visit: Payer: Medicare Other | Attending: Cardiovascular Disease | Admitting: Cardiovascular Disease

## 2022-12-13 ENCOUNTER — Encounter: Payer: Self-pay | Admitting: Cardiovascular Disease

## 2022-12-13 ENCOUNTER — Ambulatory Visit: Payer: Medicare Other | Attending: Cardiovascular Disease

## 2022-12-13 VITALS — BP 112/60 | HR 72 | Ht 68.0 in | Wt 187.8 lb

## 2022-12-13 DIAGNOSIS — I150 Renovascular hypertension: Secondary | ICD-10-CM | POA: Diagnosis not present

## 2022-12-13 DIAGNOSIS — I2510 Atherosclerotic heart disease of native coronary artery without angina pectoris: Secondary | ICD-10-CM | POA: Diagnosis not present

## 2022-12-13 DIAGNOSIS — E782 Mixed hyperlipidemia: Secondary | ICD-10-CM | POA: Diagnosis not present

## 2022-12-13 DIAGNOSIS — I401 Isolated myocarditis: Secondary | ICD-10-CM

## 2022-12-13 DIAGNOSIS — R002 Palpitations: Secondary | ICD-10-CM | POA: Diagnosis not present

## 2022-12-13 NOTE — Patient Instructions (Signed)
Medication Instructions:  Your physician recommends that you continue on your current medications as directed. Please refer to the Current Medication list given to you today.  *If you need a refill on your cardiac medications before your next appointment, please call your pharmacy*     Follow-Up: At [facility], you and your health needs are our priority.  As part of our continuing mission to provide you with exceptional heart care, we have created designated Provider Care Teams.  These Care Teams include your primary Cardiologist (physician) and Advanced Practice Providers (APPs -  Physician Assistants and Nurse Practitioners) who all work together to provide you with the care you need, when you need it.  We recommend signing up for the patient portal called "MyChart".  Sign up information is provided on this After Visit Summary.  MyChart is used to connect with patients for Virtual Visits (Telemedicine).  Patients are able to view lab/test results, encounter notes, upcoming appointments, etc.  Non-urgent messages can be sent to your provider as well.   To learn more about what you can do with MyChart, go to [email or web address].    Your next appointment:   6 month(s)- call in November to schedule next appointment   Provider:   Reatha Harps, MD Other Instructions ZIO XT- Long Term Monitor Instructions  Your physician has requested you wear a ZIO patch monitor for 3 days.  This is a single patch monitor. [employer] supplies one patch monitor per enrollment. Additional stickers are not available. Please do not apply patch if you will be having a Nuclear Stress Test,  Echocardiogram, Cardiac CT, MRI, or Chest Xray during the period you would be wearing the  monitor. The patch cannot be worn during these tests. You cannot remove and re-apply the  ZIO XT patch monitor.  Your ZIO patch monitor will be mailed 3 day USPS to your address on file. It may take 3-5 days  to receive your  monitor after you have been enrolled.  Once you have received your monitor, please review the enclosed instructions. Your monitor  has already been registered assigning a specific monitor serial # to you.  Billing and Patient Assistance Program Information  We have supplied [employer] with any of your insurance information on file for billing purposes. [employer] offers a sliding scale Patient Assistance Program for patients that do not have  insurance, or whose insurance does not completely cover the cost of the ZIO monitor.  You must apply for the Patient Assistance Program to qualify for this discounted rate.  To apply, please call [employer] at [phone], select option 4, select option 2, ask to apply for  Patient Assistance Program. Meredeth Ide will ask your household income, and how many people  are in your household. They will quote your out-of-pocket cost based on that information.  [employer] will also be able to set up a 17-month, interest-free payment plan if needed.  Applying the monitor   Shave hair from upper left chest.  Hold abrader disc by orange tab. Rub abrader in 40 strokes over the upper left chest as  indicated in your monitor instructions.  Clean area with 4 enclosed alcohol pads. Let dry.  Apply patch as indicated in monitor instructions. Patch will be placed under collarbone on left  side of chest with arrow pointing upward.  Rub patch adhesive wings for 2 minutes. Remove white label marked "1". Remove the white  label marked "2". Rub patch adhesive wings for 2 additional minutes.  While looking  in a mirror, press and release button in center of patch. A small green light will  flash 3-4 times. This will be your only indicator that the monitor has been turned on.  Do not shower for the first 24 hours. You may shower after the first 24 hours.  Press the button if you feel a symptom. You will hear a small click. Record Date, Time and  Symptom in the Patient Logbook.  When you  are ready to remove the patch, follow instructions on the last 2 pages of Patient  Logbook. Stick patch monitor onto the last page of Patient Logbook.  Place Patient Logbook in the blue and white box. Use locking tab on box and tape box closed  securely. The blue and white box has prepaid postage on it. Please place it in the mailbox as  soon as possible. Your physician should have your test results approximately 7 days after the  monitor has been mailed back to [facility].  Call [facility] Customer Care at [phone] if you have questions regarding  your ZIO XT patch monitor. Call them immediately if you see an orange light blinking on your  monitor.  If your monitor falls off in less than 4 days, contact our Monitor department at [phone].  If your monitor becomes loose or falls off after 4 days call [employer] at [phone] for  suggestions on securing your monitor

## 2022-12-13 NOTE — Progress Notes (Unsigned)
Enrolled patient for a 3 day Zio XT monitor to be mailed to patients home  

## 2022-12-16 ENCOUNTER — Encounter: Payer: Self-pay | Admitting: Cardiovascular Disease

## 2022-12-16 ENCOUNTER — Ambulatory Visit: Payer: Medicare Other | Attending: Cardiovascular Disease | Admitting: Student

## 2022-12-16 ENCOUNTER — Telehealth: Payer: Self-pay | Admitting: Pharmacist

## 2022-12-16 ENCOUNTER — Encounter: Payer: Self-pay | Admitting: Student

## 2022-12-16 ENCOUNTER — Ambulatory Visit (INDEPENDENT_AMBULATORY_CARE_PROVIDER_SITE_OTHER): Payer: Medicare Other | Admitting: Cardiovascular Disease

## 2022-12-16 VITALS — BP 100/60 | HR 67

## 2022-12-16 DIAGNOSIS — E7800 Pure hypercholesterolemia, unspecified: Secondary | ICD-10-CM | POA: Diagnosis not present

## 2022-12-16 DIAGNOSIS — I150 Renovascular hypertension: Secondary | ICD-10-CM | POA: Insufficient documentation

## 2022-12-16 DIAGNOSIS — I951 Orthostatic hypotension: Secondary | ICD-10-CM | POA: Diagnosis not present

## 2022-12-16 DIAGNOSIS — R55 Syncope and collapse: Secondary | ICD-10-CM

## 2022-12-16 DIAGNOSIS — E782 Mixed hyperlipidemia: Secondary | ICD-10-CM | POA: Diagnosis not present

## 2022-12-16 MED ORDER — EZETIMIBE 10 MG PO TABS: 10.0000 mg | ORAL_TABLET | Freq: Every day | ORAL | 3 refills | Status: AC

## 2022-12-16 NOTE — Patient Instructions (Addendum)
Medication Instructions:  Hold Telmisartan Monitor BP *If you need a refill on your cardiac medications before your next appointment, please call your pharmacy*  Lab Work: No labs today If you have labs (blood work) drawn today and your tests are completely normal, you will receive your results only by: MyChart Message (if you have MyChart) OR A paper copy in the mail If you have any lab test that is abnormal or we need to change your treatment, we will call you to review the results.  No Driving for 4 weeks. If you have not had any episodes within 4 weeks, then you can resume driving at that time.    Follow-Up: At [facility], you and your health needs are our priority.  As part of our continuing mission to provide you with exceptional heart care, we have created designated Provider Care Teams.  These Care Teams include your primary Cardiologist (physician) and Advanced Practice Providers (APPs -  Physician Assistants and Nurse Practitioners) who all work together to provide you with the care you need, when you need it.  We recommend signing up for the patient portal called "MyChart".  Sign up information is provided on this After Visit Summary.  MyChart is used to connect with patients for Virtual Visits (Telemedicine).  Patients are able to view lab/test results, encounter notes, upcoming appointments, etc.  Non-urgent messages can be sent to your provider as well.   To learn more about what you can do with MyChart, go to [email or web address].    Your next appointment:   4 week(s)  Provider:   Reatha Harps, MD

## 2022-12-16 NOTE — Assessment & Plan Note (Signed)
Assessment:  LDL goal: < 70 mg/dl last LDLc 87 mg/dl (16/01/9603) Tolerates high intensity statins well without any side effects  Follows heart healthy diet and exercise regularly  Discussed next potential options (PCSK-9 inhibitors, bempedoic acid and inclisiran); cost, dosing efficacy, side effects  Patient would like to try Zetia before going on any injectables   Plan: Continue taking current medications - Lipitor 40 mg daily Start taking Zetia 10 mg daily FLP and LFT due in 3 months

## 2022-12-16 NOTE — Progress Notes (Signed)
Cardiology Office Note:   Date:  12/16/2022  NAME:  Stanley Romero    MRN: [id number] DOB:  1945/07/23   PCP:  Tally Joe, MD  Cardiologist:  Reatha Harps, MD  Electrophysiologist:  None   Referring MD: Tally Joe, MD   Chief Complaint  Patient presents with   Loss of Consciousness    History of Present Illness:   Stanley Romero is a 77 y.o. male with a hx o of hypertension hyperlipidemia, myocarditis who is seen for the evaluation of syncope.  Being evaluated in the lipid clinic and had a syncopal episode.  He reports coming to our appointment.  He had a bowl of cereal which is less food than he normally eats and some water intake.  He did take his telmisartan.  While sitting in the lipid clinic he became flushed cold and sweaty.  He tells me that he got lightheaded.  He had a brief syncopal episode.  He did not fall or hit his head.  He was in the chair.  Staff responded appropriately.  Blood pressure initially 120/60.  On our check 100/60.  He reports sometimes when he takes his telmisartan he can get this.  Blood pressures at home are never over 110-120.  We did discuss holding his telmisartan.  He did have a vomiting episode.  He now feels well.  We did discuss that this likely was a vasovagal episode.  His EKG is normal.  He reports no chest pain or trouble breathing.  He did not urinate or defecate on himself.  No seizure-like activity reported.  He has felt this way with bee stings and other reactions in the past.  Suspect the contributor was too much blood pressure medication.  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 HLD -T chol 159, HDL 52, LDL 87, TG 114 4. HTN 5. 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   11-04-2021, vitrectomy and release of VMT with membrane peel    Past Surgical History: Past Surgical History:  Procedure Laterality Date   APPENDECTOMY  1956   CHOLECYSTECTOMY N/A 06/26/2020   Procedure: LAPAROSCOPIC CHOLECYSTECTOMY;  Surgeon: Gaynelle Adu, MD;  Location: [facility];  Service: General;  Laterality: N/A;   COLON SURGERY  2001   COLONOSCOPY  2010   ENDOSCOPIC RETROGRADE CHOLANGIOPANCREATOGRAPHY (ERCP) WITH PROPOFOL N/A 03/06/2021   Procedure: ENDOSCOPIC RETROGRADE CHOLANGIOPANCREATOGRAPHY (ERCP) WITH PROPOFOL;  Surgeon: Kerin Salen, MD;  Location: [facility];  Service: Gastroenterology;  Laterality: N/A;   FRACTURE SURGERY Right 1959   LEFT HEART CATH AND CORONARY ANGIOGRAPHY N/A 10/01/2022   Procedure: LEFT HEART CATH AND CORONARY ANGIOGRAPHY;  Surgeon: Corky Crafts, MD;  Location: [facility] INVASIVE CV LAB;  Service: Cardiovascular;  Laterality: N/A;   REMOVAL OF STONES  03/06/2021   Procedure: REMOVAL OF STONES;  Surgeon: Kerin Salen, MD;  Location: [facility];  Service: Gastroenterology;;   ROTATOR CUFF REPAIR Left 2008   SPHINCTEROTOMY  03/06/2021   Procedure: Dennison Mascot;  Surgeon: Kerin Salen, MD;  Location: [facility];  Service: Gastroenterology;;    Current Medications: No outpatient medications have been marked as taking for the 12/16/22 encounter (Office Visit) with Flora Lipps, Ronnald Ramp, MD.     Allergies:    Bee venom, Wasp venom, Fesoterodine fumarate er, Other, and Pollen extract   Social History: Social History   Socioeconomic History   Marital status: Single    Spouse name: Not on file   Number of children: Not on file   Years of education: Not on file   Highest education level: Not on file  Occupational History   Not on file  Tobacco Use    Smoking status: Never   Smokeless tobacco: Never  Vaping Use   Vaping status: Never Used  Substance and Sexual Activity   Alcohol use: Yes    Alcohol/week: 24.0 standard drinks of alcohol    Types: 14 Glasses of wine, 10 Standard drinks or equivalent per week   Drug use: Never   Sexual activity: Not on file  Other Topics Concern   Not on file  Social History Narrative   Not on file   Social Determinants of Health   Financial Resource Strain: Not on file  Food Insecurity: No Food Insecurity (09/30/2022)   Hunger Vital Sign    Worried About Running Out of Food in the Last Year: Never true    Ran Out of Food in the Last Year: Never true  Transportation Needs: No Transportation Needs (09/30/2022)   PRAPARE - Administrator, Civil Service (Medical): No    Lack of Transportation (Non-Medical): No  Physical Activity: Not on file  Stress: Not on file  Social Connections: Not on file     Family History: The patient's family history includes Allergic rhinitis in his mother and sister; Heart failure in his father. There is no history of Asthma, Eczema, or Urticaria.  ROS:   All other ROS reviewed and negative. Pertinent positives noted in the HPI.     EKGs/Labs/Other Studies Reviewed:   The following studies were personally reviewed by me today:  EKG:  EKG is ordered today.    EKG Interpretation Date/Time:  Thursday December 16 2022 09:38:55 EDT Ventricular Rate:  67 PR Interval:  158 QRS Duration:  88 QT Interval:  400 QTC Calculation: 422 R Axis:   30  Text Interpretation: Normal sinus rhythm Normal ECG Confirmed by Lennie Odor [phone]) on 12/16/2022 9:47:39 AM   Recent Labs: 10/01/2022: B Natriuretic Peptide 76.8 10/02/2022: BUN 12; Creatinine, Ser 1.10; Hemoglobin 13.8; Platelets 168; Potassium 3.8; Sodium 138; TSH 5.122 11/18/2022: ALT 19   Recent Lipid Panel    Component Value Date/Time   CHOL 159 11/18/2022 1004   TRIG 114 11/18/2022 1004   HDL 52  11/18/2022 1004   CHOLHDL 3.1 11/18/2022 1004   CHOLHDL 3.3 10/01/2022 0405   VLDL 23 10/01/2022 0405   LDLCALC 87 11/18/2022 1004    Physical Exam:   VS:  BP 100/60 (BP Location: Left Arm, Patient Position: Sitting, Cuff Size: Normal)   Pulse 67   SpO2 92%    Wt Readings from Last 3 Encounters:  12/13/22 187 lb 12.8 oz (85.2 kg)  10/14/22 183 lb 9.6 oz (83.3 kg)  10/02/22 187 lb 6.3 oz (85 kg)    General: Well nourished, well developed, in no acute distress Head: Atraumatic, normal size  Eyes: PEERLA, EOMI  Neck: Supple, no JVD Endocrine: No thryomegaly Cardiac: Normal S1, S2; RRR; no murmurs, rubs, or gallops Lungs: Clear to auscultation bilaterally,  no wheezing, rhonchi or rales  Abd: Soft, nontender, no hepatomegaly  Ext: No edema, pulses 2+ Musculoskeletal: No deformities, BUE and BLE strength normal and equal Skin: Warm and dry, no rashes   Neuro: Alert and oriented to person, place, time, and situation, CNII-XII grossly intact, no focal deficits  Psych: Normal mood and affect   ASSESSMENT:   JOHANAN SAUCER is a 77 y.o. male who presents for the following: 1. Vasovagal syncope   2. Orthostatic hypotension   3. Renovascular hypertension     PLAN:   1. Vasovagal syncope 2. Orthostatic hypotension 3. Renovascular hypertension -Vasovagal episode likely related to telmisartan use.  He did eat a little less than he normally eats this morning.  He is drinking plenty of water.  Blood pressure is low.  We will hold his blood pressure medication and he will monitor this at home.  His EKG is normal.  His CV exam is normal.  He has minimal CAD on cardiac cath recently.  He is being followed for myocarditis but this does not represent any indication of that is worsening.  I truly suspect this is just related to overmedication.  We did discuss that he will need to restrict his driving for 1 month at least.  He will complete his monitor for myocarditis evaluation.  As long as he has  no further episodes in 1 week I suspect he will be able to resume driving.  I believe this was situational and related to too much medication.  He had no red flag symptoms.  He resolved quickly.  He also vomited.  This also indicates a vasovagal episode.  No concerning symptoms for MI or recurrent myocarditis.  He will see me back in 4 weeks to discuss further.  Regarding lipid clinic he can hold on this.  I think his LDL is okay.      Disposition: Return in about 1 month (around 01/16/2023).  Medication Adjustments/Labs and Tests Ordered: Current medicines are reviewed at length with the patient today.  Concerns regarding medicines are outlined above.  Orders Placed This Encounter  Procedures   EKG 12-Lead   No orders of the defined types were placed in this encounter.  Patient Instructions  Medication Instructions:  Hold Telmisartan Monitor BP *If you need a refill on your cardiac medications before your next appointment, please call your pharmacy*  Lab Work: No labs today If you have labs (blood work) drawn today and your tests are completely normal, you will receive your results only by: MyChart Message (if you have MyChart) OR A paper copy in the mail If you have any lab test that is abnormal or we need to change your treatment, we will call you to review the results.  No Driving for 4 weeks. If you have not had any episodes within 4 weeks, then you can resume driving at that time.    Follow-Up: At [facility], you and your health needs are our priority.  As part of our continuing mission to provide you with exceptional heart care, we have created designated Provider Care Teams.  These Care Teams include your primary Cardiologist (physician) and Advanced Practice Providers (APPs -  Physician Assistants and Nurse Practitioners) who all work together to provide you with the care you need, when you need it.  We recommend signing up for the patient portal called "MyChart".   Sign up information is provided on this After Visit Summary.  MyChart is used to connect with patients for  Virtual Visits (Telemedicine).  Patients are able to view lab/test results, encounter notes, upcoming appointments, etc.  Non-urgent messages can be sent to your provider as well.   To learn more about what you can do with MyChart, go to [email or web address].    Your next appointment:   4 week(s)  Provider:   Reatha Harps, MD        Time Spent with Patient: I have spent a total of 35 minutes with patient reviewing hospital notes, telemetry, EKGs, labs and examining the patient as well as establishing an assessment and plan that was discussed with the patient.  > 50% of time was spent in direct patient care.  Signed, Lenna Gilford. Flora Lipps, MD, [facility]  [facility]  [address], [address] [place], [place] [address] [phone]  12/16/2022 10:48 AM

## 2022-12-16 NOTE — Telephone Encounter (Signed)
Code Blue called overhead at 9:10am. RN at [facility] reported patient sweating, dizzy, lightheaded. BP 120/50, O2 96%, Pulse rate: 83. BG:165. Brought to DOD pod for Dr. Flora Lipps.

## 2022-12-16 NOTE — Patient Instructions (Signed)
Changes made by your pharmacist Carmela Hurt, PharmD at today's visit:    Instructions/Changes  (what do you need to do) Your Notes  (what you did and when you did it)  Continue taking Lipitor 40 mg daily   Start taking ezetimibe 10 mg daily       If you have any questions or concerns please use My Chart to send questions or call the office at [phone] or [phone]

## 2022-12-16 NOTE — Progress Notes (Signed)
Patient ID: MAXUM KNOBEL                 DOB: 06/21/1945                    MRN: [id number]      HPI: Stanley Romero is a 77 y.o. male patient referred to lipid clinic by Jan Fireman. PMH is significant for mild non-obstructive CAD , hx of NSTEMI 09/2022, pericarditis,HLD.   Patient presented today for lipid clinic. Patient reports he has ben tolerating Lipitor 40 mg dose well. He is very active around the house and planing to go back to his regular 3 times per week gym schedule with 20-30 miles twice week bike riding. He has an MI 09/2022. Waiting to get clearance for exercise from Dr.OJennette Kettle. His last 2 LDLc are very close to the goal.Reviewed options for lowering LDL cholesterol, including ezetimibe, PCSK-9 inhibitors, bempedoic acid and inclisiran.  Discussed mechanisms of action, dosing, side effects and potential decreases in LDL cholesterol.  Also reviewed cost information and potential options for patient assistance.   Patient agreed upon trying Zetia 10 mg daily. I went out of the room to grab AVS, when return to room patient was not feeling well, his eyes were rolling side ways and he was leaning on left side. I call coumadin nurse for help, code blue was activated, patient recover ok in 5 min did not loose consciousness or breathing BP, BG and o2 level was WNL. Patient was seen by Dr.O'Neal, BP was too low so ended up adjusting some BP medications  Current Medications: Lipitor 40 mg  Intolerances: none  Risk Factors: CAD, NSTEMI, family hx of ASCVD LDL goal: <70 mg/dl Lp(a) 409.8    Diet: heart healthy diet   Exercise: rehab -  Will be starting 3 days in gym and twice week bike rides- 20 to 30 miles   Family History: dad - cholesterol,T2DM, MI - 1st at age 45  Paternal Uncle MI at 53 and died  Paternal GF MI at 34     Social History:  alcohol: 7 drinks per week  Smoking: never   Labs: Lipid Panel     Component Value Date/Time   CHOL 159 11/18/2022 1004   TRIG 114  11/18/2022 1004   HDL 52 11/18/2022 1004   CHOLHDL 3.1 11/18/2022 1004   CHOLHDL 3.3 10/01/2022 0405   VLDL 23 10/01/2022 0405   LDLCALC 87 11/18/2022 1004   LABVLDL 20 11/18/2022 1004    Past Medical History:  Diagnosis Date   CAD in native artery, cardiac cath 10/01/22 non obstructive CAD 10/02/2022   Diverticulitis    Elevated troponin 11/29/2020   Elevated troponin level not due to acute coronary syndrome 10/02/2022   Hepatitis 1973   High cholesterol    History of kidney stones 2010   Hypertension    Pericarditis 10/02/2022   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     albuterol (VENTOLIN HFA) 108 (90 Base) MCG/ACT inhaler Inhale 1-2 puffs into the lungs every 6 (six) hours as needed for wheezing or shortness of breath (during pollen season).     aspirin EC 81 MG tablet  Take 1 tablet (81 mg total) by mouth daily. Swallow whole. 30 tablet 12   atorvastatin (LIPITOR) 80 MG tablet Take 1 tablet (80 mg total) by mouth daily. 90 tablet 3   b complex vitamins capsule Take 1 capsule by mouth daily.     BENADRYL ALLERGY 25 MG tablet Take 25 mg by mouth every 6 (six) hours as needed for allergies.     Cholecalciferol (VITAMIN D) 50 MCG (2000 UT) tablet Take 2,000 Units by mouth daily.     dorzolamide-timolol (COSOPT) 22.3-6.8 MG/ML ophthalmic solution Place 1 drop into both eyes 2 (two) times daily.     EPINEPHrine 0.3 mg/0.3 mL IJ SOAJ injection Inject 0.3 mg into the muscle as needed for anaphylaxis. 1 each 2   nitroGLYCERIN (NITROSTAT) 0.4 MG SL tablet Place 1 tablet (0.4 mg total) under the tongue every 5 (five) minutes x 3 doses as needed for chest pain. 25 tablet 4   oxybutynin (DITROPAN) 5  MG tablet Take 5 mg by mouth 3 (three) times daily.     prednisoLONE acetate (PRED FORTE) 1 % ophthalmic suspension Place 1 drop into the left eye 4 (four) times daily. 10 mL 0   tamsulosin (FLOMAX) 0.4 MG CAPS capsule Take 0.4 mg by mouth daily.     telmisartan (MICARDIS) 80 MG tablet Take 80 mg by mouth daily.     vitamin C (ASCORBIC ACID) 500 MG tablet Take 500 mg by mouth daily.     vitamin E 180 MG (400 UNITS) capsule Take 400 Units by mouth daily.     zinc gluconate 50 MG tablet Take 50 mg by mouth 2 (two) times daily.     zolpidem (AMBIEN) 10 MG tablet Take 10 mg by mouth at bedtime.     ZYRTEC ALLERGY 10 MG tablet Take 10 mg by mouth daily as needed for allergies or rhinitis.     No current facility-administered medications on file prior to visit.    Allergies  Allergen Reactions   Bee Venom Anaphylaxis   Wasp Venom Anaphylaxis   Fesoterodine Fumarate Er Other (See Comments)    Dry mouth   Other Other (See Comments)    Mold - Stuffy head   Pollen Extract Other (See Comments)    Stuffy nose and head    Assessment/Plan:  1. Hyperlipidemia -  Problem  Hypercholesterolemia   Hypercholesterolemia    Hypercholesterolemia Assessment:  LDL goal: < 70 mg/dl last LDLc 87 mg/dl (95/62/1308) Tolerates high intensity statins well without any side effects  Follows heart healthy diet and exercise regularly  Discussed next potential options (PCSK-9 inhibitors, bempedoic acid and inclisiran); cost, dosing efficacy, side effects  Patient would like to try Zetia before going on any injectables   Plan: Continue taking current medications - Lipitor 40 mg daily Start taking Zetia 10 mg daily FLP and LFT due in 3 months      Thank you,  Carmela Hurt, Pharm.D [facility] A Division of [facility] [address], [place], [place] [address]  Phone: [phone]; Fax: [phone]

## 2022-12-22 DIAGNOSIS — M13831 Other specified arthritis, right wrist: Secondary | ICD-10-CM | POA: Diagnosis not present

## 2022-12-22 DIAGNOSIS — S66316A Strain of extensor muscle, fascia and tendon of right little finger at wrist and hand level, initial encounter: Secondary | ICD-10-CM | POA: Diagnosis not present

## 2022-12-28 DIAGNOSIS — I401 Isolated myocarditis: Secondary | ICD-10-CM | POA: Diagnosis not present

## 2022-12-28 DIAGNOSIS — R002 Palpitations: Secondary | ICD-10-CM

## 2023-01-05 DIAGNOSIS — I401 Isolated myocarditis: Secondary | ICD-10-CM | POA: Diagnosis not present

## 2023-01-05 DIAGNOSIS — R002 Palpitations: Secondary | ICD-10-CM | POA: Diagnosis not present

## 2023-01-06 ENCOUNTER — Ambulatory Visit (INDEPENDENT_AMBULATORY_CARE_PROVIDER_SITE_OTHER): Payer: Medicare Other

## 2023-01-06 DIAGNOSIS — Z91038 Other insect allergy status: Secondary | ICD-10-CM

## 2023-01-12 NOTE — Progress Notes (Unsigned)
Cardiology Office Note:  .   Date:  01/13/2023  ID:  Stanley Romero, DOB 12/26/1945, MRN [id number] PCP: Tally Joe, MD   HeartCare Providers Cardiologist:  Reatha Harps, MD    History of Present Illness: .   Stanley Romero is a 77 y.o. male with below history who presents for follow-up.   Discussed the use of AI scribe software for clinical note transcription with the patient, who gave verbal consent to proceed.  History of Present Illness   Stanley Romero, a patient with a history of myocarditis and hypertension, presents for a follow-up visit after a syncopal episode in the office on August 22nd. The patient reports that he had not eaten enough that morning and had taken all his vitamins, which he believes contributed to the episode. He has not had any further episodes of syncope since then.  The patient's blood pressure increased significantly after discontinuing Telmisartan, so he resumed taking it. His blood pressure at home has been running between 115/68 and 120/82.  The patient has been working out without any issues and reports no chest pain or trouble breathing. He had myocarditis in June and is now three months out without any residual symptoms.  The patient also has a history of a mitral regurgitation which is in the mild to moderate range. He has had no symptoms related to this condition.          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 HLD -T chol 159, HDL 52, LDL 87, TG 114 4. HTN 5. 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 Pertinent positives noted in the HPI.             Physical Exam:   VS:  BP 128/82 (BP Location: Right Arm, Patient Position: Sitting, Cuff Size: Normal)   Pulse 84   Ht 5\' 8"  (1.727 m)   Wt 187 lb (84.8 kg)   SpO2 96%   BMI 28.43 kg/m    Wt Readings from Last 3 Encounters:  01/13/23 187 lb (84.8 kg)  12/13/22 187 lb 12.8 oz (85.2  kg)  10/14/22 183 lb 9.6 oz (83.3 kg)    GEN: Well nourished, well developed in no acute distress NECK: No JVD; No carotid bruits CARDIAC: RRR, no murmurs, rubs, gallops RESPIRATORY:  Clear to auscultation without rales, wheezing or rhonchi  ABDOMEN: Soft, non-tender, non-distended EXTREMITIES:  No edema; No deformity   ASSESSMENT AND PLAN: .    Assessment and Plan    Syncope Single episode on 12/16/2022 likely due to a combination of inadequate food intake, poor sleep, and possible illness. No further episodes reported. Blood pressure well controlled on Telmisartan. -Continue Telmisartan. -Ensure adequate food and fluid intake, especially when taking medication.  Myocarditis Resolved. No current symptoms of chest pain or shortness of breath. Patient has returned to full physical activity. -No further action required at this time. -echo normal. monitor normal.  -no restrictions  Mitral Valve Regurgitation Mild to moderate regurgitation noted on recent echocardiogram. No symptoms reported. -Repeat echocardiogram in 1 year to monitor valve function.  Hypertension Well controlled on Telmisartan. -Continue Telmisartan. Monitor blood pressure at home.  Follow-up -Return to clinic in 1 year. -Repeat echocardiogram prior to next appointment.              Follow-up: Return in about 1 year (around 01/13/2024).  Time Spent with Patient: I have spent  a total of 25 minutes with patient reviewing hospital notes, telemetry, EKGs, labs and examining the patient as well as establishing an assessment and plan that was discussed with the patient.  > 50% of time was spent in direct patient care.  Signed, Lenna Gilford. Flora Lipps, MD, [facility]  [address], [address] [place], [place] [address] [phone]  2:00 PM

## 2023-01-13 ENCOUNTER — Encounter: Payer: Self-pay | Admitting: Cardiovascular Disease

## 2023-01-13 ENCOUNTER — Ambulatory Visit: Payer: Medicare Other | Attending: Cardiovascular Disease | Admitting: Cardiovascular Disease

## 2023-01-13 VITALS — BP 128/82 | HR 84 | Ht 68.0 in | Wt 187.0 lb

## 2023-01-13 DIAGNOSIS — I401 Isolated myocarditis: Secondary | ICD-10-CM | POA: Diagnosis not present

## 2023-01-13 DIAGNOSIS — R55 Syncope and collapse: Secondary | ICD-10-CM | POA: Diagnosis not present

## 2023-01-13 DIAGNOSIS — E782 Mixed hyperlipidemia: Secondary | ICD-10-CM

## 2023-01-13 DIAGNOSIS — I150 Renovascular hypertension: Secondary | ICD-10-CM

## 2023-01-13 DIAGNOSIS — I951 Orthostatic hypotension: Secondary | ICD-10-CM | POA: Diagnosis not present

## 2023-01-13 DIAGNOSIS — I340 Nonrheumatic mitral (valve) insufficiency: Secondary | ICD-10-CM

## 2023-01-13 DIAGNOSIS — I2510 Atherosclerotic heart disease of native coronary artery without angina pectoris: Secondary | ICD-10-CM | POA: Diagnosis not present

## 2023-01-13 NOTE — Patient Instructions (Signed)
Medication Instructions:  Your physician recommends that you continue on your current medications as directed. Please refer to the Current Medication list given to you today.    *If you need a refill on your cardiac medications before your next appointment, please call your pharmacy*  Testing/ Procedures  Echo will be scheduled at [address] [facility] [address].  Your physician has requested that you have an echocardiogram. Echocardiography is a painless test that uses sound waves to create images of your heart. It provides your doctor with information about the size and shape of your heart and how well your heart's chambers and valves are working. This procedure takes approximately one hour. There are no restrictions for this procedure. Please do NOT wear cologne, perfume, aftershave, or lotions (deodorant is allowed). Please arrive 15 minutes prior to your appointment time.   Follow-Up: At [facility], you and your health needs are our priority.  As part of our continuing mission to provide you with exceptional heart care, we have created designated Provider Care Teams.  These Care Teams include your primary Cardiologist (physician) and Advanced Practice Providers (APPs -  Physician Assistants and Nurse Practitioners) who all work together to provide you with the care you need, when you need it.  We recommend signing up for the patient portal called "MyChart".  Sign up information is provided on this After Visit Summary.  MyChart is used to connect with patients for Virtual Visits (Telemedicine).  Patients are able to view lab/test results, encounter notes, upcoming appointments, etc.  Non-urgent messages can be sent to your provider as well.   To learn more about what you can do with MyChart, go to [email or web address].    Your next appointment:   1 month(s)  The format for your next appointment:   In Person  Provider:   Reatha Harps, MD

## 2023-01-20 ENCOUNTER — Encounter (INDEPENDENT_AMBULATORY_CARE_PROVIDER_SITE_OTHER): Payer: Self-pay

## 2023-01-20 ENCOUNTER — Encounter (INDEPENDENT_AMBULATORY_CARE_PROVIDER_SITE_OTHER): Payer: Medicare Other | Admitting: Ophthalmology

## 2023-01-20 DIAGNOSIS — H35371 Puckering of macula, right eye: Secondary | ICD-10-CM | POA: Diagnosis not present

## 2023-01-20 DIAGNOSIS — H43822 Vitreomacular adhesion, left eye: Secondary | ICD-10-CM | POA: Diagnosis not present

## 2023-01-28 DIAGNOSIS — L309 Dermatitis, unspecified: Secondary | ICD-10-CM | POA: Diagnosis not present

## 2023-01-28 DIAGNOSIS — Z23 Encounter for immunization: Secondary | ICD-10-CM | POA: Diagnosis not present

## 2023-01-28 DIAGNOSIS — I10 Essential (primary) hypertension: Secondary | ICD-10-CM | POA: Diagnosis not present

## 2023-01-28 DIAGNOSIS — N3281 Overactive bladder: Secondary | ICD-10-CM | POA: Diagnosis not present

## 2023-01-28 DIAGNOSIS — N400 Enlarged prostate without lower urinary tract symptoms: Secondary | ICD-10-CM | POA: Diagnosis not present

## 2023-01-28 DIAGNOSIS — G4700 Insomnia, unspecified: Secondary | ICD-10-CM | POA: Diagnosis not present

## 2023-01-28 DIAGNOSIS — E7800 Pure hypercholesterolemia, unspecified: Secondary | ICD-10-CM | POA: Diagnosis not present

## 2023-01-28 DIAGNOSIS — I700 Atherosclerosis of aorta: Secondary | ICD-10-CM | POA: Diagnosis not present

## 2023-01-28 DIAGNOSIS — R7303 Prediabetes: Secondary | ICD-10-CM | POA: Diagnosis not present

## 2023-01-28 DIAGNOSIS — H9190 Unspecified hearing loss, unspecified ear: Secondary | ICD-10-CM | POA: Diagnosis not present

## 2023-01-31 DIAGNOSIS — H35373 Puckering of macula, bilateral: Secondary | ICD-10-CM | POA: Diagnosis not present

## 2023-01-31 DIAGNOSIS — H43821 Vitreomacular adhesion, right eye: Secondary | ICD-10-CM | POA: Diagnosis not present

## 2023-01-31 DIAGNOSIS — H35033 Hypertensive retinopathy, bilateral: Secondary | ICD-10-CM | POA: Diagnosis not present

## 2023-01-31 DIAGNOSIS — Z961 Presence of intraocular lens: Secondary | ICD-10-CM | POA: Diagnosis not present

## 2023-01-31 DIAGNOSIS — H401132 Primary open-angle glaucoma, bilateral, moderate stage: Secondary | ICD-10-CM | POA: Diagnosis not present

## 2023-01-31 DIAGNOSIS — H43392 Other vitreous opacities, left eye: Secondary | ICD-10-CM | POA: Diagnosis not present

## 2023-02-02 ENCOUNTER — Ambulatory Visit (INDEPENDENT_AMBULATORY_CARE_PROVIDER_SITE_OTHER): Payer: Medicare Other | Admitting: *Deleted

## 2023-02-02 DIAGNOSIS — Z91038 Other insect allergy status: Secondary | ICD-10-CM

## 2023-02-15 DIAGNOSIS — S46011A Strain of muscle(s) and tendon(s) of the rotator cuff of right shoulder, initial encounter: Secondary | ICD-10-CM | POA: Diagnosis not present

## 2023-02-15 DIAGNOSIS — M19011 Primary osteoarthritis, right shoulder: Secondary | ICD-10-CM | POA: Diagnosis not present

## 2023-02-15 DIAGNOSIS — M25511 Pain in right shoulder: Secondary | ICD-10-CM | POA: Diagnosis not present

## 2023-02-15 DIAGNOSIS — M25411 Effusion, right shoulder: Secondary | ICD-10-CM | POA: Diagnosis not present

## 2023-02-17 DIAGNOSIS — L309 Dermatitis, unspecified: Secondary | ICD-10-CM | POA: Diagnosis not present

## 2023-03-02 ENCOUNTER — Ambulatory Visit (INDEPENDENT_AMBULATORY_CARE_PROVIDER_SITE_OTHER): Payer: Medicare Other | Admitting: *Deleted

## 2023-03-02 DIAGNOSIS — Z91038 Other insect allergy status: Secondary | ICD-10-CM

## 2023-03-15 ENCOUNTER — Telehealth: Payer: Self-pay | Admitting: Pharmacist

## 2023-03-15 NOTE — Telephone Encounter (Signed)
Zetia started in Sept,2024. F/u lipid an hepatic labs are due call patient to remind. Patient will go for lab on Nov 20,2024

## 2023-03-16 DIAGNOSIS — E782 Mixed hyperlipidemia: Secondary | ICD-10-CM | POA: Diagnosis not present

## 2023-03-17 LAB — HEPATIC FUNCTION PANEL
ALT: 28 [IU]/L (ref 0–44)
AST: 24 [IU]/L (ref 0–40)
Albumin: 4 g/dL (ref 3.8–4.8)
Alkaline Phosphatase: 267 [IU]/L — ABNORMAL HIGH (ref 44–121)
Bilirubin Total: 0.8 mg/dL (ref 0.0–1.2)
Bilirubin, Direct: 0.25 mg/dL (ref 0.00–0.40)
Total Protein: 6.6 g/dL (ref 6.0–8.5)

## 2023-03-17 LAB — LIPID PANEL
Chol/HDL Ratio: 3 {ratio} (ref 0.0–5.0)
Cholesterol, Total: 177 mg/dL (ref 100–199)
HDL: 60 mg/dL (ref >39)
LDL Chol Calc (NIH): 100 mg/dL — ABNORMAL HIGH (ref 0–99)
Triglycerides: 92 mg/dL (ref 0–149)
VLDL Cholesterol Cal: 17 mg/dL (ref 5–40)

## 2023-03-28 DIAGNOSIS — L570 Actinic keratosis: Secondary | ICD-10-CM | POA: Diagnosis not present

## 2023-03-28 DIAGNOSIS — L814 Other melanin hyperpigmentation: Secondary | ICD-10-CM | POA: Diagnosis not present

## 2023-03-28 DIAGNOSIS — C44319 Basal cell carcinoma of skin of other parts of face: Secondary | ICD-10-CM | POA: Diagnosis not present

## 2023-03-28 DIAGNOSIS — L309 Dermatitis, unspecified: Secondary | ICD-10-CM | POA: Diagnosis not present

## 2023-03-28 DIAGNOSIS — L821 Other seborrheic keratosis: Secondary | ICD-10-CM | POA: Diagnosis not present

## 2023-03-28 DIAGNOSIS — D485 Neoplasm of uncertain behavior of skin: Secondary | ICD-10-CM | POA: Diagnosis not present

## 2023-03-30 ENCOUNTER — Ambulatory Visit (INDEPENDENT_AMBULATORY_CARE_PROVIDER_SITE_OTHER): Payer: Medicare Other

## 2023-03-30 DIAGNOSIS — Z91038 Other insect allergy status: Secondary | ICD-10-CM | POA: Diagnosis not present

## 2023-04-04 DIAGNOSIS — R509 Fever, unspecified: Secondary | ICD-10-CM | POA: Diagnosis not present

## 2023-04-04 DIAGNOSIS — H1089 Other conjunctivitis: Secondary | ICD-10-CM | POA: Diagnosis not present

## 2023-04-04 DIAGNOSIS — B349 Viral infection, unspecified: Secondary | ICD-10-CM | POA: Diagnosis not present

## 2023-04-04 DIAGNOSIS — Z03818 Encounter for observation for suspected exposure to other biological agents ruled out: Secondary | ICD-10-CM | POA: Diagnosis not present

## 2023-04-21 DIAGNOSIS — I10 Essential (primary) hypertension: Secondary | ICD-10-CM | POA: Diagnosis not present

## 2023-04-21 DIAGNOSIS — Z87898 Personal history of other specified conditions: Secondary | ICD-10-CM | POA: Diagnosis not present

## 2023-04-21 DIAGNOSIS — G4700 Insomnia, unspecified: Secondary | ICD-10-CM | POA: Diagnosis not present

## 2023-04-21 DIAGNOSIS — I25119 Atherosclerotic heart disease of native coronary artery with unspecified angina pectoris: Secondary | ICD-10-CM | POA: Diagnosis not present

## 2023-04-21 DIAGNOSIS — R7303 Prediabetes: Secondary | ICD-10-CM | POA: Diagnosis not present

## 2023-04-21 DIAGNOSIS — Z8679 Personal history of other diseases of the circulatory system: Secondary | ICD-10-CM | POA: Diagnosis not present

## 2023-04-21 DIAGNOSIS — H9190 Unspecified hearing loss, unspecified ear: Secondary | ICD-10-CM | POA: Diagnosis not present

## 2023-04-21 DIAGNOSIS — I700 Atherosclerosis of aorta: Secondary | ICD-10-CM | POA: Diagnosis not present

## 2023-04-21 DIAGNOSIS — E7800 Pure hypercholesterolemia, unspecified: Secondary | ICD-10-CM | POA: Diagnosis not present

## 2023-04-21 DIAGNOSIS — N400 Enlarged prostate without lower urinary tract symptoms: Secondary | ICD-10-CM | POA: Diagnosis not present

## 2023-04-21 DIAGNOSIS — N3281 Overactive bladder: Secondary | ICD-10-CM | POA: Diagnosis not present

## 2023-04-28 ENCOUNTER — Ambulatory Visit: Payer: Medicare Other

## 2023-04-28 DIAGNOSIS — H0102B Squamous blepharitis left eye, upper and lower eyelids: Secondary | ICD-10-CM | POA: Diagnosis not present

## 2023-04-28 DIAGNOSIS — H0102A Squamous blepharitis right eye, upper and lower eyelids: Secondary | ICD-10-CM | POA: Diagnosis not present

## 2023-04-29 ENCOUNTER — Ambulatory Visit (INDEPENDENT_AMBULATORY_CARE_PROVIDER_SITE_OTHER): Payer: Medicare Other

## 2023-04-29 DIAGNOSIS — Z91038 Other insect allergy status: Secondary | ICD-10-CM

## 2023-05-27 ENCOUNTER — Ambulatory Visit (INDEPENDENT_AMBULATORY_CARE_PROVIDER_SITE_OTHER): Payer: Medicare HMO | Admitting: *Deleted

## 2023-05-27 DIAGNOSIS — Z91038 Other insect allergy status: Secondary | ICD-10-CM

## 2023-06-23 ENCOUNTER — Ambulatory Visit (INDEPENDENT_AMBULATORY_CARE_PROVIDER_SITE_OTHER): Payer: Medicare HMO

## 2023-06-23 DIAGNOSIS — Z91038 Other insect allergy status: Secondary | ICD-10-CM
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
## 2023-07-18 ENCOUNTER — Ambulatory Visit: Payer: Medicare HMO | Admitting: Allergy

## 2023-07-18 ENCOUNTER — Other Ambulatory Visit: Payer: Self-pay

## 2023-07-18 ENCOUNTER — Ambulatory Visit: Payer: Medicare HMO | Admitting: *Deleted

## 2023-07-18 ENCOUNTER — Encounter: Payer: Self-pay | Admitting: Allergy

## 2023-07-18 VITALS — BP 126/82 | HR 82 | Temp 97.4°F | Resp 18 | Ht 66.0 in | Wt 187.9 lb

## 2023-07-18 DIAGNOSIS — T63481D Toxic effect of venom of other arthropod, accidental (unintentional), subsequent encounter: Secondary | ICD-10-CM

## 2023-07-18 DIAGNOSIS — Z91038 Other insect allergy status: Secondary | ICD-10-CM

## 2023-07-18 DIAGNOSIS — J3089 Other allergic rhinitis: Secondary | ICD-10-CM | POA: Diagnosis not present

## 2023-07-18 DIAGNOSIS — J45990 Exercise induced bronchospasm: Secondary | ICD-10-CM

## 2023-07-18 DIAGNOSIS — T782XXD Anaphylactic shock, unspecified, subsequent encounter: Secondary | ICD-10-CM

## 2023-07-18 MED ORDER — EPINEPHRINE 0.3 MG/0.3ML IJ SOAJ: 0.3000 mg | INTRAMUSCULAR | 2 refills | Status: AC | PRN

## 2023-07-18 NOTE — Patient Instructions (Addendum)
 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 Continue allergy shots as per protocol.  Make sure you have both pens with you at the same time. If you have to use the Epipen - then please call 911 or go to the ER. For mild symptoms you can take over the counter antihistamines such as Benadryl 1-2 tablets = 25-50mg  and monitor symptoms closely. If symptoms worsen or if you have severe symptoms including breathing issues, throat closure, significant swelling, whole body hives, severe diarrhea and vomiting, lightheadedness then inject epinephrine and seek immediate medical care afterwards. Emergency action plan in place.   Recommend getting a medical alert bracelet/necklace regarding the bee sting allergy.  Breathing May use albuterol rescue inhaler 2 puffs every 4 to 6 hours as needed for shortness of breath, chest tightness, coughing, and wheezing. May use albuterol rescue inhaler 2 puffs 5 to 15 minutes prior to strenuous physical activities. Monitor frequency of use - if you need to use it more than twice per week on a consistent basis let us know.   Other allergic rhinitis Use over the counter antihistamines such as Zyrtec (cetirizine), Claritin (loratadine), Allegra (fexofenadine), or Xyzal (levocetirizine) daily as needed. May switch antihistamines every few months.  Follow up in 12 months or sooner if needed.

## 2023-08-02 DIAGNOSIS — H35033 Hypertensive retinopathy, bilateral: Secondary | ICD-10-CM | POA: Diagnosis not present

## 2023-08-02 DIAGNOSIS — H35373 Puckering of macula, bilateral: Secondary | ICD-10-CM | POA: Diagnosis not present

## 2023-08-02 DIAGNOSIS — H0102B Squamous blepharitis left eye, upper and lower eyelids: Secondary | ICD-10-CM | POA: Diagnosis not present

## 2023-08-02 DIAGNOSIS — H401132 Primary open-angle glaucoma, bilateral, moderate stage: Secondary | ICD-10-CM | POA: Diagnosis not present

## 2023-08-02 DIAGNOSIS — H43392 Other vitreous opacities, left eye: Secondary | ICD-10-CM | POA: Diagnosis not present

## 2023-08-02 DIAGNOSIS — H0102A Squamous blepharitis right eye, upper and lower eyelids: Secondary | ICD-10-CM | POA: Diagnosis not present

## 2023-08-15 ENCOUNTER — Ambulatory Visit (INDEPENDENT_AMBULATORY_CARE_PROVIDER_SITE_OTHER)

## 2023-08-15 DIAGNOSIS — Z91038 Other insect allergy status: Secondary | ICD-10-CM

## 2023-08-16 DIAGNOSIS — H906 Mixed conductive and sensorineural hearing loss, bilateral: Secondary | ICD-10-CM | POA: Diagnosis not present

## 2023-08-25 DIAGNOSIS — M19011 Primary osteoarthritis, right shoulder: Secondary | ICD-10-CM | POA: Diagnosis not present

## 2023-08-25 DIAGNOSIS — M818 Other osteoporosis without current pathological fracture: Secondary | ICD-10-CM | POA: Diagnosis not present

## 2023-08-25 DIAGNOSIS — M25511 Pain in right shoulder: Secondary | ICD-10-CM | POA: Diagnosis not present

## 2023-09-12 ENCOUNTER — Ambulatory Visit

## 2023-09-12 DIAGNOSIS — Z91038 Other insect allergy status: Secondary | ICD-10-CM | POA: Diagnosis not present

## 2023-10-11 ENCOUNTER — Ambulatory Visit (INDEPENDENT_AMBULATORY_CARE_PROVIDER_SITE_OTHER)

## 2023-10-11 DIAGNOSIS — Z91038 Other insect allergy status: Secondary | ICD-10-CM

## 2023-11-16 ENCOUNTER — Ambulatory Visit (INDEPENDENT_AMBULATORY_CARE_PROVIDER_SITE_OTHER)

## 2023-11-16 DIAGNOSIS — Z91038 Other insect allergy status: Secondary | ICD-10-CM | POA: Diagnosis not present

## 2023-12-14 ENCOUNTER — Ambulatory Visit (INDEPENDENT_AMBULATORY_CARE_PROVIDER_SITE_OTHER)

## 2023-12-14 DIAGNOSIS — Z91038 Other insect allergy status: Secondary | ICD-10-CM | POA: Diagnosis not present

## 2024-01-05 ENCOUNTER — Encounter: Payer: Self-pay | Admitting: Cardiovascular Disease

## 2024-01-12 ENCOUNTER — Ambulatory Visit: Payer: Self-pay | Admitting: Cardiovascular Disease

## 2024-01-12 ENCOUNTER — Ambulatory Visit (HOSPITAL_COMMUNITY)
Admission: RE | Admit: 2024-01-12 | Discharge: 2024-01-12 | Disposition: A | Discharge status: Home / Self Care | Payer: Self-pay | Source: Ambulatory Visit | Attending: Internal Medicine | Admitting: Internal Medicine

## 2024-01-12 DIAGNOSIS — I340 Nonrheumatic mitral (valve) insufficiency: Secondary | ICD-10-CM | POA: Insufficient documentation

## 2024-01-12 LAB — ECHOCARDIOGRAM COMPLETE
Area-P 1/2: 2.91 cm2
MV M vel: 4.72 m/s
MV Peak grad: 89.1 mmHg
Radius: 0.7 cm
S' Lateral: 2.4 cm

## 2024-01-19 DIAGNOSIS — H43812 Vitreous degeneration, left eye: Secondary | ICD-10-CM | POA: Diagnosis not present

## 2024-01-19 DIAGNOSIS — H35371 Puckering of macula, right eye: Secondary | ICD-10-CM | POA: Diagnosis not present

## 2024-01-19 DIAGNOSIS — H401132 Primary open-angle glaucoma, bilateral, moderate stage: Secondary | ICD-10-CM | POA: Diagnosis not present

## 2024-01-30 ENCOUNTER — Ambulatory Visit

## 2024-02-02 ENCOUNTER — Ambulatory Visit (INDEPENDENT_AMBULATORY_CARE_PROVIDER_SITE_OTHER)

## 2024-02-02 DIAGNOSIS — Z91038 Other insect allergy status: Secondary | ICD-10-CM | POA: Diagnosis not present

## 2024-03-02 DIAGNOSIS — Z23 Encounter for immunization: Secondary | ICD-10-CM | POA: Diagnosis not present

## 2024-03-02 DIAGNOSIS — N400 Enlarged prostate without lower urinary tract symptoms: Secondary | ICD-10-CM | POA: Diagnosis not present

## 2024-03-02 DIAGNOSIS — R7303 Prediabetes: Secondary | ICD-10-CM | POA: Diagnosis not present

## 2024-03-02 DIAGNOSIS — H9190 Unspecified hearing loss, unspecified ear: Secondary | ICD-10-CM | POA: Diagnosis not present

## 2024-03-02 DIAGNOSIS — I10 Essential (primary) hypertension: Secondary | ICD-10-CM | POA: Diagnosis not present

## 2024-03-02 DIAGNOSIS — N3281 Overactive bladder: Secondary | ICD-10-CM | POA: Diagnosis not present

## 2024-03-02 DIAGNOSIS — G4700 Insomnia, unspecified: Secondary | ICD-10-CM | POA: Diagnosis not present

## 2024-03-02 DIAGNOSIS — L299 Pruritus, unspecified: Secondary | ICD-10-CM | POA: Diagnosis not present

## 2024-03-02 DIAGNOSIS — E7800 Pure hypercholesterolemia, unspecified: Secondary | ICD-10-CM | POA: Diagnosis not present

## 2024-03-15 ENCOUNTER — Ambulatory Visit

## 2024-03-15 DIAGNOSIS — T63441D Toxic effect of venom of bees, accidental (unintentional), subsequent encounter: Secondary | ICD-10-CM

## 2024-03-15 DIAGNOSIS — Z91038 Other insect allergy status: Secondary | ICD-10-CM | POA: Diagnosis not present

## 2024-03-29 DIAGNOSIS — L821 Other seborrheic keratosis: Secondary | ICD-10-CM | POA: Diagnosis not present

## 2024-03-29 DIAGNOSIS — D2372 Other benign neoplasm of skin of left lower limb, including hip: Secondary | ICD-10-CM | POA: Diagnosis not present

## 2024-03-29 DIAGNOSIS — L905 Scar conditions and fibrosis of skin: Secondary | ICD-10-CM | POA: Diagnosis not present

## 2024-03-29 DIAGNOSIS — Z85828 Personal history of other malignant neoplasm of skin: Secondary | ICD-10-CM | POA: Diagnosis not present

## 2024-03-29 DIAGNOSIS — D225 Melanocytic nevi of trunk: Secondary | ICD-10-CM | POA: Diagnosis not present

## 2024-04-27 ENCOUNTER — Ambulatory Visit (INDEPENDENT_AMBULATORY_CARE_PROVIDER_SITE_OTHER)

## 2024-04-27 DIAGNOSIS — T63441D Toxic effect of venom of bees, accidental (unintentional), subsequent encounter: Secondary | ICD-10-CM

## 2024-04-27 DIAGNOSIS — Z91038 Other insect allergy status: Secondary | ICD-10-CM

## 2024-05-14 NOTE — Progress Notes (Unsigned)
 " Cardiology Office Note:  .   Date:  05/18/2024  ID:  Stanley Romero, DOB 07/09/1945, MRN [id number] PCP: Seabron Lenis, MD  [facility] Providers Cardiologist:  Darryle ONEIDA Decent, MD   History of Present Illness: .    Chief Complaint  Patient presents with   Follow-up    Stanley Romero is a 79 y.o. male with below history who presents for follow-up.   History of Present Illness   Stanley Romero is a 79 year old male with nonobstructive coronary artery disease and mitral valve regurgitation who presents for follow-up.  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 He has a history of mitral valve regurgitation for the past two to three years. No chest pain, shortness of breath, or leg swelling. Blood pressure typically ranges from 110 to 125 mmHg, and cholesterol levels were good as of November.  He is currently taking telmisartan 40 mg for blood pressure, Zetia  10 mg, and Lipitor 40 mg for cholesterol management. He was previously on aspirin  81 mg and has experienced concerns about bleeding with minor injuries.  He remains very active, engaging in significant physical activity, including working outside and cycling. He plans to increase his cycling activity after moving to a new location with better conditions for biking. He rides approximately 50 to 60 miles a week and exercises regularly.           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 HLD -T chol 168, HDL 62, LDL 89, TG 93 4. HTN 5. 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 Pertinent positives noted in the HPI.     Studies Reviewed: SABRA   EKG Interpretation Date/Time:  Friday May 18 2024 14:45:58 EST Ventricular Rate:  60 PR Interval:  164 QRS Duration:  82 QT Interval:  402 QTC Calculation: 402 R Axis:   2  Text Interpretation: Normal sinus rhythm Normal ECG Confirmed by Decent Darryle [phone]) on 05/18/2024 3:01:46 PM   TTE 01/12/2024  1. Left ventricular ejection fraction, by estimation, is 60 to 65%. Left  ventricular ejection fraction by 3D volume is 62 %. The left ventricle has  normal function. The left ventricle has no regional wall motion  abnormalities. There is moderate  asymmetric left ventricular hypertrophy. Left ventricular diastolic  parameters are consistent with Grade I diastolic dysfunction (impaired  relaxation). Elevated left ventricular end-diastolic pressure. The E/e' is  15.   2. Right ventricular systolic function is normal. The right ventricular  size is normal.   3. Left atrial size was moderately dilated.   4. The mitral valve is degenerative. Mild to moderate mitral valve  regurgitation. There is mild late systolic prolapse of the middle scallop  of the posterior leaflet of the mitral valve. Moderate mitral annular  calcification.   5. The aortic valve was not well visualized. Aortic valve regurgitation  is not visualized. Aortic valve sclerosis is present, with no evidence of  aortic valve stenosis.   Physical Exam:   VS:  BP (!) 108/56 (BP Location: Left Arm, Patient Position: Sitting, Cuff Size: Normal)   Pulse 60   Ht 5' 8 (1.727 m)   Wt 187 lb (84.8 kg)   SpO2 95%   BMI 28.43 kg/m    Wt Readings from Last 3 Encounters:  05/18/24 187 lb (84.8 kg)  07/18/23 187 lb 14.4  oz (85.2 kg)  01/13/23 187 lb (84.8 kg)    GEN: Well nourished, well developed in no acute distress NECK: No JVD; No carotid bruits CARDIAC: RRR, no murmurs, rubs, gallops RESPIRATORY:  Clear to auscultation without rales, wheezing or rhonchi  ABDOMEN: Soft, non-tender, non-distended EXTREMITIES:  No edema; No deformity  ASSESSMENT AND PLAN: .   Assessment and Plan    Mitral valve regurgitation Moderate regurgitation with leaflet prolapse, stable for 2-3 years. Asymptomatic. No activity restrictions. Emphasized importance of symptom monitoring due to  potential rapid changes in valve condition. - Scheduled echocardiogram before next annual appointment. - Monitor for symptoms of worsening valvular heart disease, including dyspnea, peripheral edema, or changes in activity level.   Minimal CAD HLD - no need for ASA. Stop today. Continue lipitor 40 mg daily and zetia  10 mg daily. LDL <100 ok.                Follow-up: Return in about 1 year (around 05/18/2025).  Signed, Darryle DASEN. Barbaraann, MD, [facility]  [facility]  [address] [place], [place] [address] [phone]  3:24 PM   "

## 2024-05-18 ENCOUNTER — Ambulatory Visit: Attending: Cardiovascular Disease | Admitting: Cardiovascular Disease

## 2024-05-18 ENCOUNTER — Encounter: Payer: Self-pay | Admitting: Cardiovascular Disease

## 2024-05-18 VITALS — BP 108/56 | HR 60 | Ht 68.0 in | Wt 187.0 lb

## 2024-05-18 DIAGNOSIS — I340 Nonrheumatic mitral (valve) insufficiency: Secondary | ICD-10-CM | POA: Diagnosis not present

## 2024-05-18 DIAGNOSIS — I951 Orthostatic hypotension: Secondary | ICD-10-CM

## 2024-05-18 DIAGNOSIS — I150 Renovascular hypertension: Secondary | ICD-10-CM

## 2024-05-18 NOTE — Patient Instructions (Addendum)
 Medication Instructions:  Your physician has recommended you make the following change in your medication:   -Stop taking aspirin .  *If you need a refill on your cardiac medications before your next appointment, please call your pharmacy*  Testing/Procedures: Your physician has requested that you have an echocardiogram. Echocardiography is a painless test that uses sound waves to create images of your heart. It provides your doctor with information about the size and shape of your heart and how well your hearts chambers and valves are working. This procedure takes approximately one hour. There are no restrictions for this procedure. Please do NOT wear cologne, perfume, aftershave, or lotions (deodorant is allowed). Please arrive 15 minutes prior to your appointment time.  Please note: We ask at that you not bring children with you during ultrasound (echo/ vascular) testing. Due to room size and safety concerns, children are not allowed in the ultrasound rooms during exams. Our front office staff cannot provide observation of children in our lobby area while testing is being conducted. An adult accompanying a patient to their appointment will only be allowed in the ultrasound room at the discretion of the ultrasound technician under special circumstances. We apologize for any inconvenience. **To do in January 2027 (prior to office visit)**    Follow-Up: At [facility], you and your health needs are our priority.  As part of our continuing mission to provide you with exceptional heart care, our providers are all part of one team.  This team includes your primary Cardiologist (physician) and Advanced Practice Providers or APPs (Physician Assistants and Nurse Practitioners) who all work together to provide you with the care you need, when you need it.  Your next appointment:   12 month(s)  Provider:   Darryle ONEIDA Decent, MD    We recommend signing up for the patient portal called  MyChart.  Sign up information is provided on this After Visit Summary.  MyChart is used to connect with patients for Virtual Visits (Telemedicine).  Patients are able to view lab/test results, encounter notes, upcoming appointments, etc.  Non-urgent messages can be sent to your provider as well.   To learn more about what you can do with MyChart, go to [email or web address].   Other Instructions

## 2024-06-08 ENCOUNTER — Ambulatory Visit

## 2024-06-11 ENCOUNTER — Ambulatory Visit

## 2024-07-18 ENCOUNTER — Ambulatory Visit: Admitting: Allergy
